# Patient Record
Sex: Female | Born: 1960 | Race: Black or African American | Hispanic: No | Smoking: Former smoker
Health system: Southern US, Community
[De-identification: ages and names within clinical notes are randomized; demographics above are authoritative.]

## PROBLEM LIST (undated history)

## (undated) DIAGNOSIS — K219 Gastro-esophageal reflux disease without esophagitis: Secondary | ICD-10-CM

## (undated) DIAGNOSIS — C50919 Malignant neoplasm of unspecified site of unspecified female breast: Secondary | ICD-10-CM

## (undated) DIAGNOSIS — B0089 Other herpesviral infection: Secondary | ICD-10-CM

## (undated) DIAGNOSIS — I890 Lymphedema, not elsewhere classified: Secondary | ICD-10-CM

## (undated) DIAGNOSIS — E7800 Pure hypercholesterolemia, unspecified: Secondary | ICD-10-CM

## (undated) DIAGNOSIS — I10 Essential (primary) hypertension: Secondary | ICD-10-CM

## (undated) DIAGNOSIS — M3313 Other dermatomyositis without myopathy: Secondary | ICD-10-CM

## (undated) DIAGNOSIS — M1990 Unspecified osteoarthritis, unspecified site: Secondary | ICD-10-CM

## (undated) DIAGNOSIS — M3390 Dermatopolymyositis, unspecified, organ involvement unspecified: Secondary | ICD-10-CM

## (undated) DIAGNOSIS — I2510 Atherosclerotic heart disease of native coronary artery without angina pectoris: Secondary | ICD-10-CM

## (undated) DIAGNOSIS — E042 Nontoxic multinodular goiter: Secondary | ICD-10-CM

## (undated) HISTORY — PX: ABDOMINAL HYSTERECTOMY: SHX81

## (undated) HISTORY — DX: Lymphedema, not elsewhere classified: I89.0

## (undated) HISTORY — DX: Nontoxic multinodular goiter: E04.2

## (undated) HISTORY — PX: BREAST RECONSTRUCTION: SHX9

## (undated) HISTORY — DX: Essential (primary) hypertension: I10

## (undated) HISTORY — DX: Other herpesviral infection: B00.89

## (undated) HISTORY — DX: Dermatopolymyositis, unspecified, organ involvement unspecified: M33.90

## (undated) HISTORY — DX: Unspecified osteoarthritis, unspecified site: M19.90

## (undated) HISTORY — DX: Gastro-esophageal reflux disease without esophagitis: K21.9

## (undated) HISTORY — DX: Other dermatomyositis without myopathy: M33.13

## (undated) HISTORY — DX: Malignant neoplasm of unspecified site of unspecified female breast: C50.919

## (undated) HISTORY — DX: Pure hypercholesterolemia, unspecified: E78.00

---

## 1983-07-28 HISTORY — PX: TUBAL LIGATION: SHX77

## 2005-05-22 ENCOUNTER — Ambulatory Visit (HOSPITAL_COMMUNITY): Admission: RE | Admit: 2005-05-22 | Discharge: 2005-05-22 | Payer: Self-pay

## 2006-07-27 DIAGNOSIS — B0089 Other herpesviral infection: Secondary | ICD-10-CM

## 2006-07-27 DIAGNOSIS — C50919 Malignant neoplasm of unspecified site of unspecified female breast: Secondary | ICD-10-CM

## 2006-07-27 DIAGNOSIS — K208 Other esophagitis without bleeding: Secondary | ICD-10-CM

## 2006-07-27 HISTORY — PX: MASTECTOMY, PARTIAL: SHX709

## 2006-07-27 HISTORY — DX: Malignant neoplasm of unspecified site of unspecified female breast: C50.919

## 2006-07-27 HISTORY — DX: Other herpesviral infection: B00.89

## 2006-07-27 HISTORY — DX: Other esophagitis without bleeding: K20.80

## 2006-12-21 ENCOUNTER — Ambulatory Visit (HOSPITAL_COMMUNITY): Admission: RE | Admit: 2006-12-21 | Discharge: 2006-12-21 | Payer: Self-pay | Admitting: Obstetrics & Gynecology

## 2007-01-12 ENCOUNTER — Encounter (INDEPENDENT_AMBULATORY_CARE_PROVIDER_SITE_OTHER): Payer: Self-pay | Admitting: Diagnostic Radiology

## 2007-01-12 ENCOUNTER — Ambulatory Visit (HOSPITAL_COMMUNITY): Admission: RE | Admit: 2007-01-12 | Discharge: 2007-01-12 | Payer: Self-pay | Admitting: Obstetrics & Gynecology

## 2007-01-17 ENCOUNTER — Ambulatory Visit (HOSPITAL_COMMUNITY): Admission: RE | Admit: 2007-01-17 | Discharge: 2007-01-17 | Payer: Self-pay | Admitting: Obstetrics & Gynecology

## 2007-01-19 ENCOUNTER — Ambulatory Visit (HOSPITAL_COMMUNITY): Admission: RE | Admit: 2007-01-19 | Discharge: 2007-01-19 | Payer: Self-pay | Admitting: Obstetrics & Gynecology

## 2007-01-19 ENCOUNTER — Encounter (INDEPENDENT_AMBULATORY_CARE_PROVIDER_SITE_OTHER): Payer: Self-pay | Admitting: Diagnostic Radiology

## 2007-01-26 ENCOUNTER — Ambulatory Visit (HOSPITAL_COMMUNITY): Payer: Self-pay | Admitting: Oncology

## 2007-01-26 ENCOUNTER — Encounter (HOSPITAL_COMMUNITY): Admission: RE | Admit: 2007-01-26 | Discharge: 2007-02-25 | Payer: Self-pay | Admitting: Oncology

## 2007-01-31 ENCOUNTER — Ambulatory Visit (HOSPITAL_COMMUNITY): Admission: RE | Admit: 2007-01-31 | Discharge: 2007-01-31 | Payer: Self-pay | Admitting: Oncology

## 2007-02-02 ENCOUNTER — Encounter (INDEPENDENT_AMBULATORY_CARE_PROVIDER_SITE_OTHER): Payer: Self-pay | Admitting: Diagnostic Radiology

## 2007-02-02 ENCOUNTER — Ambulatory Visit (HOSPITAL_COMMUNITY): Admission: RE | Admit: 2007-02-02 | Discharge: 2007-02-02 | Payer: Self-pay | Admitting: General Surgery

## 2007-02-07 ENCOUNTER — Ambulatory Visit (HOSPITAL_COMMUNITY): Admission: RE | Admit: 2007-02-07 | Discharge: 2007-02-07 | Payer: Self-pay | Admitting: General Surgery

## 2007-02-21 ENCOUNTER — Ambulatory Visit (HOSPITAL_COMMUNITY): Admission: RE | Admit: 2007-02-21 | Discharge: 2007-02-21 | Payer: Self-pay | Admitting: General Surgery

## 2007-02-21 ENCOUNTER — Encounter (INDEPENDENT_AMBULATORY_CARE_PROVIDER_SITE_OTHER): Payer: Self-pay | Admitting: General Surgery

## 2007-02-22 ENCOUNTER — Ambulatory Visit: Payer: Self-pay | Admitting: Family Medicine

## 2007-02-22 DIAGNOSIS — I10 Essential (primary) hypertension: Secondary | ICD-10-CM | POA: Insufficient documentation

## 2007-02-22 DIAGNOSIS — E785 Hyperlipidemia, unspecified: Secondary | ICD-10-CM | POA: Insufficient documentation

## 2007-02-22 DIAGNOSIS — K219 Gastro-esophageal reflux disease without esophagitis: Secondary | ICD-10-CM | POA: Insufficient documentation

## 2007-02-22 DIAGNOSIS — Z853 Personal history of malignant neoplasm of breast: Secondary | ICD-10-CM | POA: Insufficient documentation

## 2007-02-24 ENCOUNTER — Encounter (INDEPENDENT_AMBULATORY_CARE_PROVIDER_SITE_OTHER): Payer: Self-pay | Admitting: Family Medicine

## 2007-03-01 ENCOUNTER — Ambulatory Visit: Payer: Self-pay | Admitting: Family Medicine

## 2007-03-04 ENCOUNTER — Encounter (INDEPENDENT_AMBULATORY_CARE_PROVIDER_SITE_OTHER): Payer: Self-pay | Admitting: Family Medicine

## 2007-03-07 ENCOUNTER — Telehealth (INDEPENDENT_AMBULATORY_CARE_PROVIDER_SITE_OTHER): Payer: Self-pay | Admitting: Family Medicine

## 2007-03-08 ENCOUNTER — Ambulatory Visit: Payer: Self-pay | Admitting: Family Medicine

## 2007-03-08 ENCOUNTER — Inpatient Hospital Stay (HOSPITAL_COMMUNITY): Admission: AD | Admit: 2007-03-08 | Discharge: 2007-03-15 | Payer: Self-pay

## 2007-03-08 ENCOUNTER — Ambulatory Visit: Payer: Self-pay | Admitting: Cardiology

## 2007-03-08 LAB — CONVERTED CEMR LAB
Albumin: 2.7 g/dL — ABNORMAL LOW (ref 3.5–5.2)
Alkaline Phosphatase: 80 units/L (ref 39–117)
BUN: 12 mg/dL (ref 6–23)
CO2: 26 meq/L (ref 19–32)
Calcium: 8.3 mg/dL — ABNORMAL LOW (ref 8.4–10.5)
Chloride: 97 meq/L (ref 96–112)
Glucose, Bld: 139 mg/dL — ABNORMAL HIGH (ref 70–99)
Glucose, Urine, Semiquant: NEGATIVE
Lymphocytes Relative: 5 % — ABNORMAL LOW (ref 12–46)
Lymphs Abs: 0.7 10*3/uL (ref 0.7–3.3)
MCV: 86.5 fL (ref 78.0–100.0)
Monocytes Relative: 1 % — ABNORMAL LOW (ref 3–11)
Neutro Abs: 13.4 10*3/uL — ABNORMAL HIGH (ref 1.7–7.7)
Neutrophils Relative %: 94 % — ABNORMAL HIGH (ref 43–77)
Platelets: 261 10*3/uL (ref 150–400)
Potassium: 3.6 meq/L (ref 3.5–5.3)
Protein, U semiquant: 100
RBC: 4.17 M/uL (ref 3.87–5.11)
Rapid Strep: NEGATIVE
Sodium: 131 meq/L — ABNORMAL LOW (ref 135–145)
Specific Gravity, Urine: 1.025
Total Protein: 6.3 g/dL (ref 6.0–8.3)
WBC Urine, dipstick: NEGATIVE
WBC: 14.3 10*3/uL — ABNORMAL HIGH (ref 4.0–10.5)

## 2007-03-09 ENCOUNTER — Encounter (INDEPENDENT_AMBULATORY_CARE_PROVIDER_SITE_OTHER): Payer: Self-pay | Admitting: Family Medicine

## 2007-03-10 ENCOUNTER — Encounter (INDEPENDENT_AMBULATORY_CARE_PROVIDER_SITE_OTHER): Payer: Self-pay | Admitting: Family Medicine

## 2007-03-10 ENCOUNTER — Ambulatory Visit: Payer: Self-pay | Admitting: Oncology

## 2007-03-15 ENCOUNTER — Encounter (INDEPENDENT_AMBULATORY_CARE_PROVIDER_SITE_OTHER): Payer: Self-pay | Admitting: Family Medicine

## 2007-03-16 ENCOUNTER — Ambulatory Visit: Payer: Self-pay | Admitting: Cardiology

## 2007-03-16 ENCOUNTER — Encounter (INDEPENDENT_AMBULATORY_CARE_PROVIDER_SITE_OTHER): Payer: Self-pay | Admitting: Family Medicine

## 2007-03-17 ENCOUNTER — Ambulatory Visit (HOSPITAL_COMMUNITY): Admission: RE | Admit: 2007-03-17 | Discharge: 2007-03-17 | Payer: Self-pay | Admitting: Cardiology

## 2007-03-17 ENCOUNTER — Ambulatory Visit: Payer: Self-pay | Admitting: Cardiology

## 2007-03-22 ENCOUNTER — Ambulatory Visit (HOSPITAL_COMMUNITY): Payer: Self-pay | Admitting: Oncology

## 2007-03-22 ENCOUNTER — Encounter (HOSPITAL_COMMUNITY): Admission: RE | Admit: 2007-03-22 | Discharge: 2007-04-21 | Payer: Self-pay | Admitting: Oncology

## 2007-03-23 ENCOUNTER — Encounter (INDEPENDENT_AMBULATORY_CARE_PROVIDER_SITE_OTHER): Payer: Self-pay | Admitting: Family Medicine

## 2007-03-29 ENCOUNTER — Encounter (INDEPENDENT_AMBULATORY_CARE_PROVIDER_SITE_OTHER): Payer: Self-pay | Admitting: Family Medicine

## 2007-03-29 ENCOUNTER — Telehealth (INDEPENDENT_AMBULATORY_CARE_PROVIDER_SITE_OTHER): Payer: Self-pay | Admitting: Family Medicine

## 2007-03-30 ENCOUNTER — Ambulatory Visit (HOSPITAL_COMMUNITY): Admission: RE | Admit: 2007-03-30 | Discharge: 2007-03-30 | Payer: Self-pay | Admitting: Family Medicine

## 2007-04-05 ENCOUNTER — Telehealth (INDEPENDENT_AMBULATORY_CARE_PROVIDER_SITE_OTHER): Payer: Self-pay | Admitting: Family Medicine

## 2007-04-05 ENCOUNTER — Ambulatory Visit: Payer: Self-pay | Admitting: Family Medicine

## 2007-04-05 DIAGNOSIS — E041 Nontoxic single thyroid nodule: Secondary | ICD-10-CM

## 2007-04-06 ENCOUNTER — Encounter (INDEPENDENT_AMBULATORY_CARE_PROVIDER_SITE_OTHER): Payer: Self-pay | Admitting: Family Medicine

## 2007-04-07 ENCOUNTER — Telehealth (INDEPENDENT_AMBULATORY_CARE_PROVIDER_SITE_OTHER): Payer: Self-pay | Admitting: *Deleted

## 2007-04-08 ENCOUNTER — Ambulatory Visit (HOSPITAL_COMMUNITY): Admission: RE | Admit: 2007-04-08 | Discharge: 2007-04-08 | Payer: Self-pay | Admitting: Cardiovascular Disease

## 2007-04-11 ENCOUNTER — Telehealth (INDEPENDENT_AMBULATORY_CARE_PROVIDER_SITE_OTHER): Payer: Self-pay | Admitting: *Deleted

## 2007-04-12 ENCOUNTER — Encounter (INDEPENDENT_AMBULATORY_CARE_PROVIDER_SITE_OTHER): Payer: Self-pay | Admitting: Family Medicine

## 2007-04-18 ENCOUNTER — Ambulatory Visit: Payer: Self-pay | Admitting: Family Medicine

## 2007-04-18 LAB — CONVERTED CEMR LAB
Cholesterol, target level: 200 mg/dL
LDL Goal: 130 mg/dL

## 2007-04-19 ENCOUNTER — Encounter (INDEPENDENT_AMBULATORY_CARE_PROVIDER_SITE_OTHER): Payer: Self-pay | Admitting: Family Medicine

## 2007-04-19 ENCOUNTER — Telehealth (INDEPENDENT_AMBULATORY_CARE_PROVIDER_SITE_OTHER): Payer: Self-pay | Admitting: *Deleted

## 2007-04-21 ENCOUNTER — Telehealth (INDEPENDENT_AMBULATORY_CARE_PROVIDER_SITE_OTHER): Payer: Self-pay | Admitting: *Deleted

## 2007-04-22 ENCOUNTER — Encounter (INDEPENDENT_AMBULATORY_CARE_PROVIDER_SITE_OTHER): Payer: Self-pay | Admitting: Family Medicine

## 2007-04-25 ENCOUNTER — Telehealth (INDEPENDENT_AMBULATORY_CARE_PROVIDER_SITE_OTHER): Payer: Self-pay | Admitting: *Deleted

## 2007-04-25 ENCOUNTER — Ambulatory Visit (HOSPITAL_COMMUNITY): Admission: RE | Admit: 2007-04-25 | Discharge: 2007-04-25 | Payer: Self-pay | Admitting: Family Medicine

## 2007-04-25 ENCOUNTER — Encounter (INDEPENDENT_AMBULATORY_CARE_PROVIDER_SITE_OTHER): Payer: Self-pay | Admitting: Family Medicine

## 2007-04-27 ENCOUNTER — Ambulatory Visit: Payer: Self-pay | Admitting: Gastroenterology

## 2007-04-29 ENCOUNTER — Encounter (HOSPITAL_COMMUNITY): Admission: RE | Admit: 2007-04-29 | Discharge: 2007-05-29 | Payer: Self-pay | Admitting: Oncology

## 2007-05-02 ENCOUNTER — Ambulatory Visit: Payer: Self-pay | Admitting: Gastroenterology

## 2007-05-02 ENCOUNTER — Encounter (INDEPENDENT_AMBULATORY_CARE_PROVIDER_SITE_OTHER): Payer: Self-pay | Admitting: Family Medicine

## 2007-05-02 ENCOUNTER — Encounter: Payer: Self-pay | Admitting: Gastroenterology

## 2007-05-02 ENCOUNTER — Ambulatory Visit (HOSPITAL_COMMUNITY): Admission: RE | Admit: 2007-05-02 | Discharge: 2007-05-02 | Payer: Self-pay | Admitting: Gastroenterology

## 2007-05-02 HISTORY — PX: ESOPHAGOGASTRODUODENOSCOPY: SHX1529

## 2007-05-05 ENCOUNTER — Ambulatory Visit: Payer: Self-pay | Admitting: Family Medicine

## 2007-05-11 ENCOUNTER — Encounter (INDEPENDENT_AMBULATORY_CARE_PROVIDER_SITE_OTHER): Payer: Self-pay | Admitting: Family Medicine

## 2007-05-11 ENCOUNTER — Ambulatory Visit (HOSPITAL_COMMUNITY): Payer: Self-pay | Admitting: Oncology

## 2007-05-16 ENCOUNTER — Encounter (INDEPENDENT_AMBULATORY_CARE_PROVIDER_SITE_OTHER): Payer: Self-pay | Admitting: Family Medicine

## 2007-05-19 ENCOUNTER — Encounter (INDEPENDENT_AMBULATORY_CARE_PROVIDER_SITE_OTHER): Payer: Self-pay | Admitting: Family Medicine

## 2007-06-08 ENCOUNTER — Encounter (HOSPITAL_COMMUNITY): Admission: RE | Admit: 2007-06-08 | Discharge: 2007-07-08 | Payer: Self-pay | Admitting: Oncology

## 2007-06-16 ENCOUNTER — Encounter (INDEPENDENT_AMBULATORY_CARE_PROVIDER_SITE_OTHER): Payer: Self-pay | Admitting: Family Medicine

## 2007-06-20 ENCOUNTER — Ambulatory Visit: Payer: Self-pay | Admitting: Family Medicine

## 2007-06-29 ENCOUNTER — Encounter (INDEPENDENT_AMBULATORY_CARE_PROVIDER_SITE_OTHER): Payer: Self-pay | Admitting: Family Medicine

## 2007-06-30 ENCOUNTER — Encounter (INDEPENDENT_AMBULATORY_CARE_PROVIDER_SITE_OTHER): Payer: Self-pay | Admitting: Family Medicine

## 2007-06-30 ENCOUNTER — Telehealth (INDEPENDENT_AMBULATORY_CARE_PROVIDER_SITE_OTHER): Payer: Self-pay | Admitting: *Deleted

## 2007-06-30 LAB — CONVERTED CEMR LAB
AST: 18 units/L (ref 0–37)
BUN: 8 mg/dL (ref 6–23)
Calcium: 9.4 mg/dL (ref 8.4–10.5)
Chloride: 104 meq/L (ref 96–112)
Creatinine, Ser: 0.95 mg/dL (ref 0.40–1.20)
HDL: 43 mg/dL (ref >39)
Total Bilirubin: 0.4 mg/dL (ref 0.3–1.2)
Total CHOL/HDL Ratio: 4.8
VLDL: 32 mg/dL (ref 0–40)

## 2007-07-04 ENCOUNTER — Encounter (INDEPENDENT_AMBULATORY_CARE_PROVIDER_SITE_OTHER): Payer: Self-pay | Admitting: Family Medicine

## 2007-07-04 ENCOUNTER — Ambulatory Visit (HOSPITAL_COMMUNITY): Payer: Self-pay | Admitting: Oncology

## 2007-07-11 ENCOUNTER — Ambulatory Visit (HOSPITAL_COMMUNITY): Admission: RE | Admit: 2007-07-11 | Discharge: 2007-07-12 | Payer: Self-pay | Admitting: General Surgery

## 2007-07-11 ENCOUNTER — Encounter: Admission: RE | Admit: 2007-07-11 | Discharge: 2007-07-11 | Payer: Self-pay | Admitting: General Surgery

## 2007-07-11 ENCOUNTER — Encounter (INDEPENDENT_AMBULATORY_CARE_PROVIDER_SITE_OTHER): Payer: Self-pay | Admitting: General Surgery

## 2007-07-25 ENCOUNTER — Telehealth (INDEPENDENT_AMBULATORY_CARE_PROVIDER_SITE_OTHER): Payer: Self-pay | Admitting: Family Medicine

## 2007-07-26 ENCOUNTER — Ambulatory Visit: Payer: Self-pay | Admitting: Family Medicine

## 2007-07-26 DIAGNOSIS — N951 Menopausal and female climacteric states: Secondary | ICD-10-CM | POA: Insufficient documentation

## 2007-07-27 ENCOUNTER — Encounter (INDEPENDENT_AMBULATORY_CARE_PROVIDER_SITE_OTHER): Payer: Self-pay | Admitting: Family Medicine

## 2007-07-27 ENCOUNTER — Telehealth (INDEPENDENT_AMBULATORY_CARE_PROVIDER_SITE_OTHER): Payer: Self-pay | Admitting: *Deleted

## 2007-07-27 LAB — CONVERTED CEMR LAB
Basophils Absolute: 0 10*3/uL (ref 0.0–0.1)
Basophils Relative: 1 % (ref 0–1)
Eosinophils Absolute: 0.3 10*3/uL (ref 0.0–0.7)
Eosinophils Relative: 5 % (ref 0–5)
HCT: 36.5 % (ref 36.0–46.0)
Hemoglobin: 11.4 g/dL — ABNORMAL LOW (ref 12.0–15.0)
Lymphocytes Relative: 21 % (ref 12–46)
Lymphs Abs: 1.4 10*3/uL (ref 0.7–4.0)
MCHC: 31.2 g/dL (ref 30.0–36.0)
MCV: 88.8 fL (ref 78.0–100.0)
Monocytes Absolute: 0.4 10*3/uL (ref 0.1–1.0)
Monocytes Relative: 6 % (ref 3–12)
Neutro Abs: 4.3 10*3/uL (ref 1.7–7.7)
Neutrophils Relative %: 68 % (ref 43–77)
Platelets: 280 10*3/uL (ref 150–400)
RBC: 4.11 M/uL (ref 3.87–5.11)
RDW: 15.1 % (ref 11.5–15.5)
TSH: 0.807 microintl units/mL (ref 0.350–5.50)
WBC: 6.4 10*3/uL (ref 4.0–10.5)

## 2007-08-01 ENCOUNTER — Encounter (INDEPENDENT_AMBULATORY_CARE_PROVIDER_SITE_OTHER): Payer: Self-pay | Admitting: Family Medicine

## 2007-08-02 ENCOUNTER — Ambulatory Visit: Admission: RE | Admit: 2007-08-02 | Discharge: 2007-10-31 | Payer: Self-pay | Admitting: Radiation Oncology

## 2007-08-22 ENCOUNTER — Ambulatory Visit (HOSPITAL_COMMUNITY): Payer: Self-pay | Admitting: Oncology

## 2007-08-22 ENCOUNTER — Encounter (INDEPENDENT_AMBULATORY_CARE_PROVIDER_SITE_OTHER): Payer: Self-pay | Admitting: Family Medicine

## 2007-08-24 ENCOUNTER — Ambulatory Visit: Payer: Self-pay | Admitting: Family Medicine

## 2007-10-21 ENCOUNTER — Ambulatory Visit: Payer: Self-pay | Admitting: Family Medicine

## 2007-11-25 ENCOUNTER — Encounter (INDEPENDENT_AMBULATORY_CARE_PROVIDER_SITE_OTHER): Payer: Self-pay | Admitting: Family Medicine

## 2007-11-26 ENCOUNTER — Emergency Department (HOSPITAL_COMMUNITY): Admission: EM | Admit: 2007-11-26 | Discharge: 2007-11-26 | Payer: Self-pay | Admitting: Emergency Medicine

## 2007-11-28 ENCOUNTER — Encounter (INDEPENDENT_AMBULATORY_CARE_PROVIDER_SITE_OTHER): Payer: Self-pay | Admitting: Family Medicine

## 2007-11-28 ENCOUNTER — Telehealth (INDEPENDENT_AMBULATORY_CARE_PROVIDER_SITE_OTHER): Payer: Self-pay | Admitting: *Deleted

## 2007-11-28 LAB — CONVERTED CEMR LAB
ALT: 11 units/L (ref 0–35)
AST: 12 units/L (ref 0–37)
Alkaline Phosphatase: 99 units/L (ref 39–117)
LDL Cholesterol: 109 mg/dL — ABNORMAL HIGH (ref 0–99)
Sodium: 143 meq/L (ref 135–145)
Total Bilirubin: 0.4 mg/dL (ref 0.3–1.2)
Total Protein: 7.6 g/dL (ref 6.0–8.3)
VLDL: 21 mg/dL (ref 0–40)

## 2007-12-09 ENCOUNTER — Telehealth (INDEPENDENT_AMBULATORY_CARE_PROVIDER_SITE_OTHER): Payer: Self-pay | Admitting: *Deleted

## 2007-12-09 ENCOUNTER — Ambulatory Visit: Payer: Self-pay | Admitting: Family Medicine

## 2007-12-09 LAB — CONVERTED CEMR LAB

## 2008-01-20 ENCOUNTER — Ambulatory Visit: Payer: Self-pay | Admitting: Family Medicine

## 2008-01-20 LAB — CONVERTED CEMR LAB
Bilirubin Urine: NEGATIVE
Blood in Urine, dipstick: NEGATIVE
Glucose, Urine, Semiquant: NEGATIVE
Ketones, urine, test strip: NEGATIVE
Nitrite: NEGATIVE
Specific Gravity, Urine: 1.02
Urobilinogen, UA: 0.2
pH: 6

## 2008-01-23 ENCOUNTER — Encounter (INDEPENDENT_AMBULATORY_CARE_PROVIDER_SITE_OTHER): Payer: Self-pay | Admitting: Family Medicine

## 2008-01-23 ENCOUNTER — Ambulatory Visit (HOSPITAL_COMMUNITY): Payer: Self-pay | Admitting: Oncology

## 2008-01-23 ENCOUNTER — Encounter (HOSPITAL_COMMUNITY): Admission: RE | Admit: 2008-01-23 | Discharge: 2008-02-22 | Payer: Self-pay | Admitting: Oncology

## 2008-02-28 ENCOUNTER — Encounter (INDEPENDENT_AMBULATORY_CARE_PROVIDER_SITE_OTHER): Payer: Self-pay | Admitting: Family Medicine

## 2008-03-05 ENCOUNTER — Encounter (HOSPITAL_COMMUNITY): Admission: RE | Admit: 2008-03-05 | Discharge: 2008-04-04 | Payer: Self-pay | Admitting: Oncology

## 2008-04-03 ENCOUNTER — Ambulatory Visit: Payer: Self-pay | Admitting: Family Medicine

## 2008-04-03 DIAGNOSIS — F341 Dysthymic disorder: Secondary | ICD-10-CM | POA: Insufficient documentation

## 2008-05-15 ENCOUNTER — Ambulatory Visit: Payer: Self-pay | Admitting: Family Medicine

## 2008-06-13 ENCOUNTER — Ambulatory Visit: Payer: Self-pay | Admitting: Family Medicine

## 2008-06-13 LAB — CONVERTED CEMR LAB

## 2008-06-29 ENCOUNTER — Encounter (INDEPENDENT_AMBULATORY_CARE_PROVIDER_SITE_OTHER): Payer: Self-pay | Admitting: Family Medicine

## 2008-07-02 LAB — CONVERTED CEMR LAB
ALT: 11 units/L (ref 0–35)
CO2: 25 meq/L (ref 19–32)
Creatinine, Ser: 1.13 mg/dL (ref 0.40–1.20)
Total Bilirubin: 0.4 mg/dL (ref 0.3–1.2)

## 2008-07-16 ENCOUNTER — Encounter (INDEPENDENT_AMBULATORY_CARE_PROVIDER_SITE_OTHER): Payer: Self-pay | Admitting: Family Medicine

## 2008-08-07 ENCOUNTER — Ambulatory Visit: Payer: Self-pay | Admitting: Family Medicine

## 2008-09-03 ENCOUNTER — Encounter (INDEPENDENT_AMBULATORY_CARE_PROVIDER_SITE_OTHER): Payer: Self-pay | Admitting: Family Medicine

## 2008-09-04 ENCOUNTER — Ambulatory Visit: Payer: Self-pay | Admitting: Family Medicine

## 2008-09-04 ENCOUNTER — Telehealth (INDEPENDENT_AMBULATORY_CARE_PROVIDER_SITE_OTHER): Payer: Self-pay | Admitting: *Deleted

## 2008-09-04 DIAGNOSIS — I890 Lymphedema, not elsewhere classified: Secondary | ICD-10-CM | POA: Insufficient documentation

## 2008-09-06 ENCOUNTER — Encounter (INDEPENDENT_AMBULATORY_CARE_PROVIDER_SITE_OTHER): Payer: Self-pay | Admitting: Family Medicine

## 2008-09-06 LAB — CONVERTED CEMR LAB
Alkaline Phosphatase: 97 units/L (ref 39–117)
BUN: 12 mg/dL (ref 6–23)
Creatinine, Ser: 0.87 mg/dL (ref 0.40–1.20)
Eosinophils Absolute: 0.2 10*3/uL (ref 0.0–0.7)
Eosinophils Relative: 3 % (ref 0–5)
Glucose, Bld: 76 mg/dL (ref 70–99)
HCT: 39.1 % (ref 36.0–46.0)
HDL: 55 mg/dL (ref >39)
LDL Cholesterol: 137 mg/dL — ABNORMAL HIGH (ref 0–99)
Lymphs Abs: 2.6 10*3/uL (ref 0.7–4.0)
MCV: 89.1 fL (ref 78.0–100.0)
Monocytes Absolute: 0.3 10*3/uL (ref 0.1–1.0)
Monocytes Relative: 5 % (ref 3–12)
Platelets: 248 10*3/uL (ref 150–400)
Sodium: 143 meq/L (ref 135–145)
Total Bilirubin: 0.3 mg/dL (ref 0.3–1.2)
Triglycerides: 158 mg/dL — ABNORMAL HIGH (ref <150)
VLDL: 32 mg/dL (ref 0–40)
WBC: 6 10*3/uL (ref 4.0–10.5)

## 2008-09-11 ENCOUNTER — Encounter (HOSPITAL_COMMUNITY): Admission: RE | Admit: 2008-09-11 | Discharge: 2008-10-11 | Payer: Self-pay | Admitting: Family Medicine

## 2008-09-11 ENCOUNTER — Encounter (INDEPENDENT_AMBULATORY_CARE_PROVIDER_SITE_OTHER): Payer: Self-pay | Admitting: Family Medicine

## 2008-09-12 ENCOUNTER — Encounter (INDEPENDENT_AMBULATORY_CARE_PROVIDER_SITE_OTHER): Payer: Self-pay | Admitting: Family Medicine

## 2008-09-14 LAB — CONVERTED CEMR LAB

## 2008-09-18 ENCOUNTER — Ambulatory Visit: Payer: Self-pay | Admitting: Family Medicine

## 2008-10-06 IMAGING — US US ABDOMEN COMPLETE
1 series · 14 of 25 positions shown · non-contrast
Comparison: None.

CLINICAL DATA: 45 year-old with sepsis.  History of left breast cancer.  Elevated liver function study.
ABDOMEN ULTRASOUND:
TECHNIQUE: Complete abdominal ultrasound examination was performed including evaluation of the liver, gallbladder, bile ducts, pancreas, kidneys, spleen, IVC, and abdominal aorta.

[Series 1: unknown · 0.35mm/px · 14 of 71 slices shown]
[im 1/71]
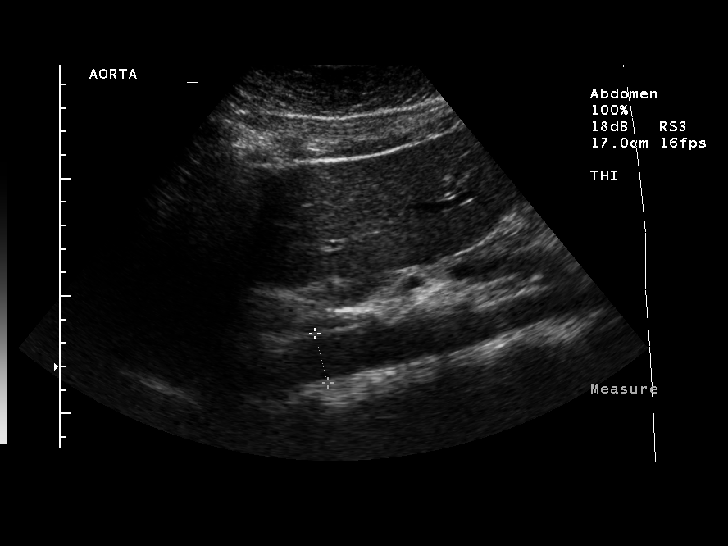
[im 6/71]
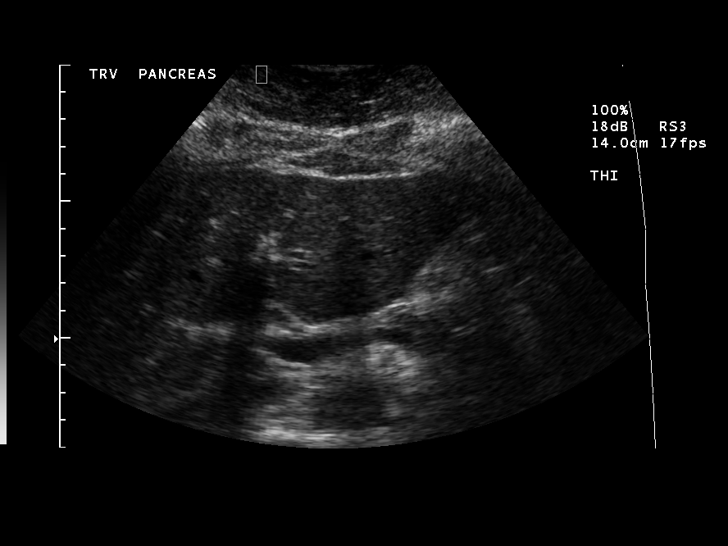
[im 12/71]
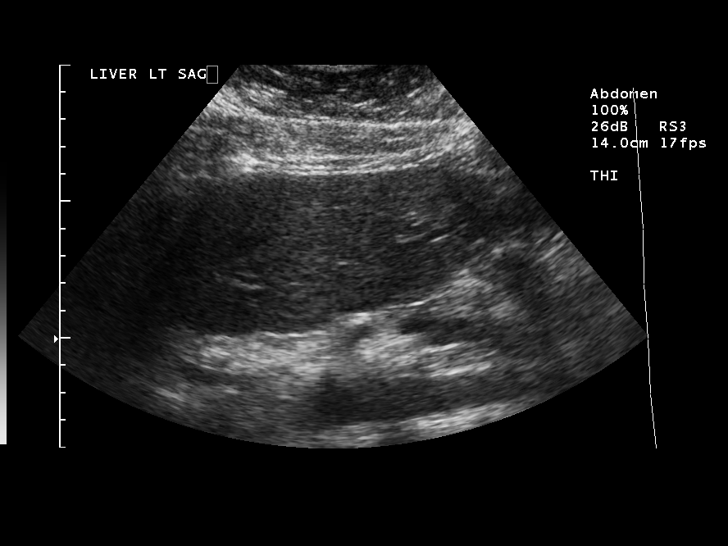
[im 18/71]
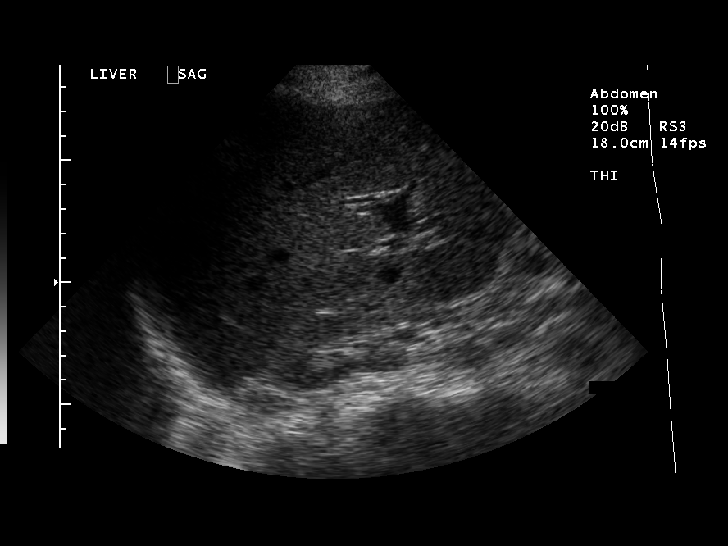
[im 24/71]
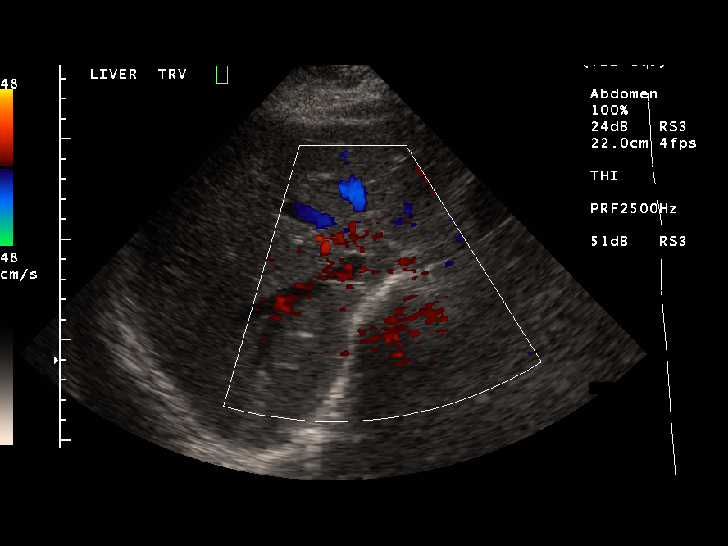
[im 27/71]
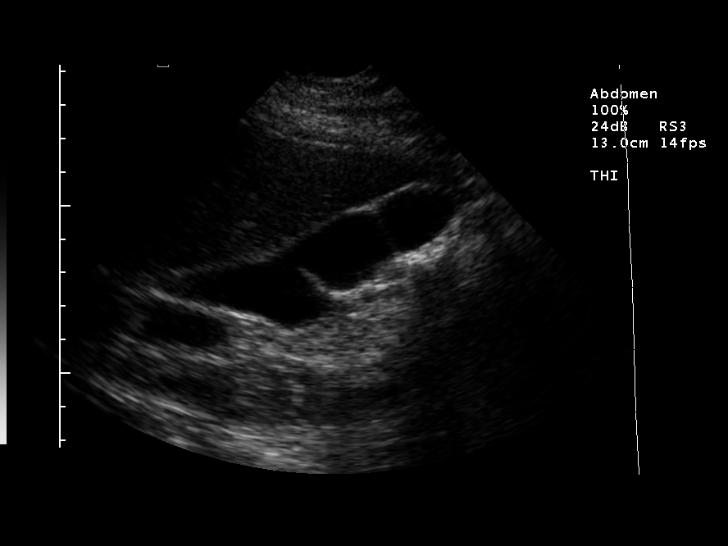
[im 33/71]
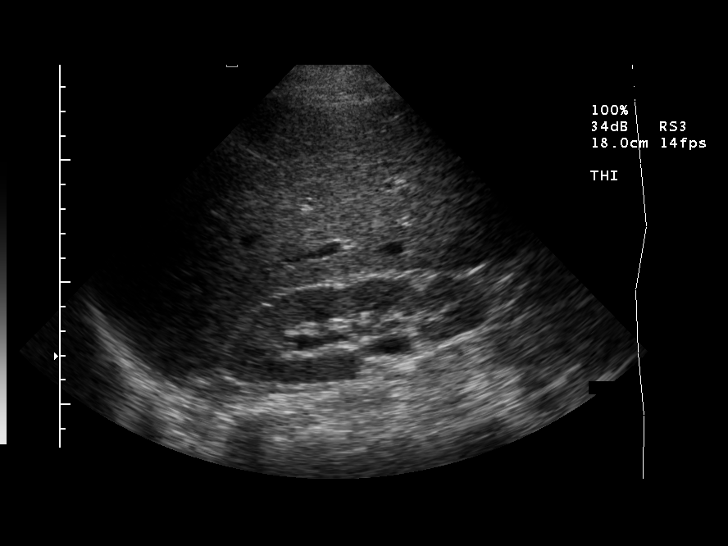
[im 38/71]
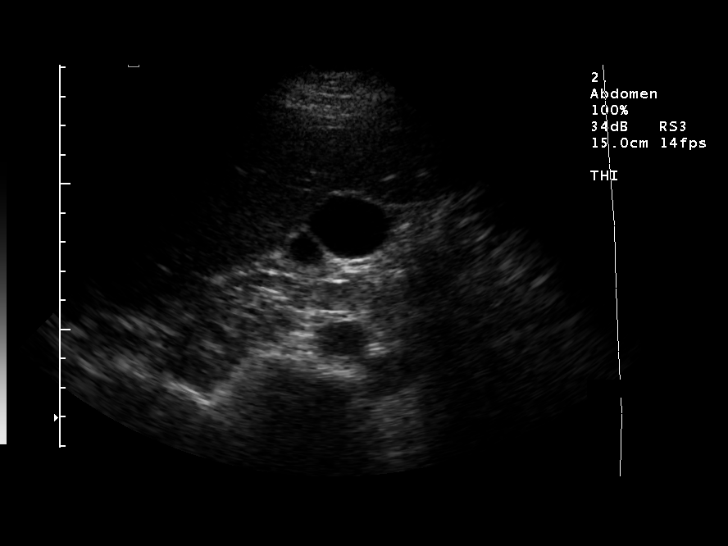
[im 44/71]
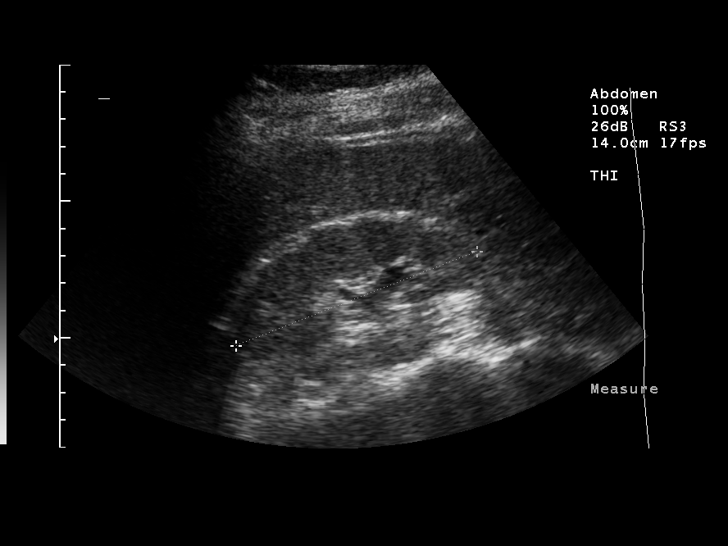
[im 47/71]
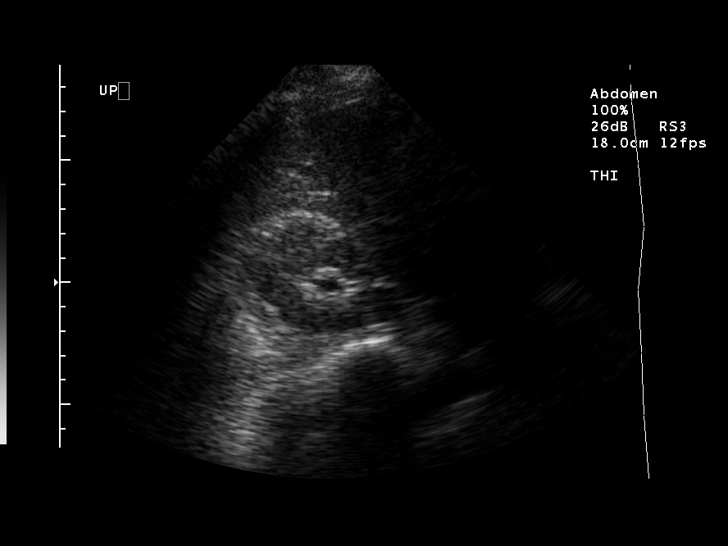
[im 53/71]
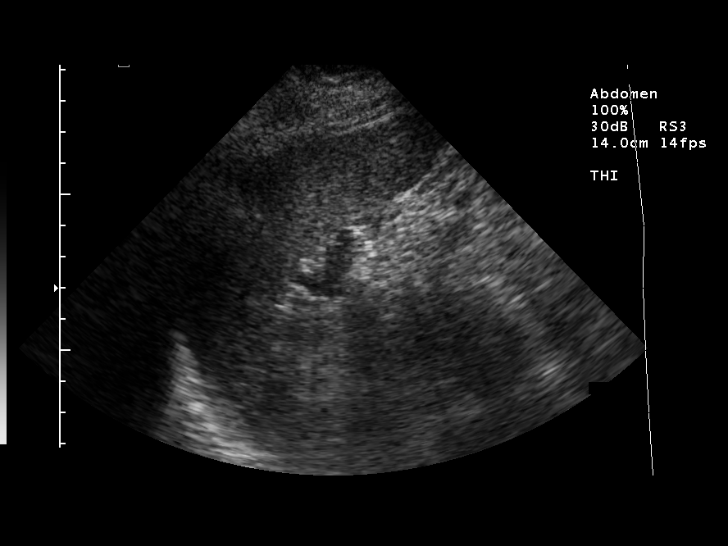
[im 59/71]
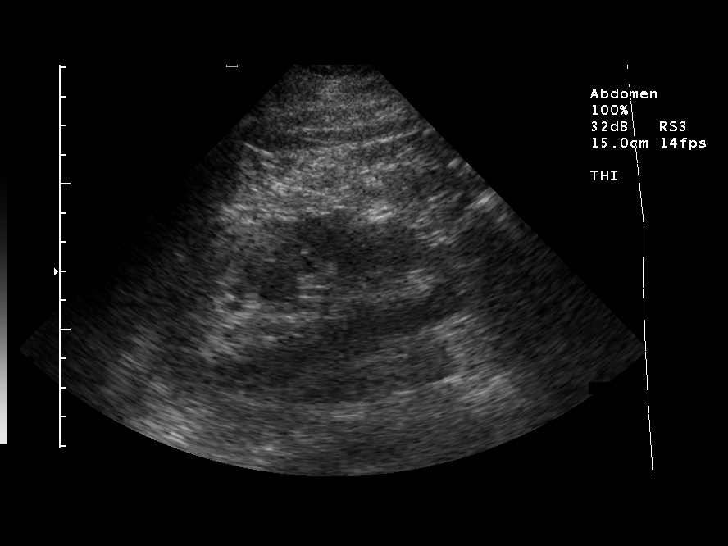
[im 65/71]
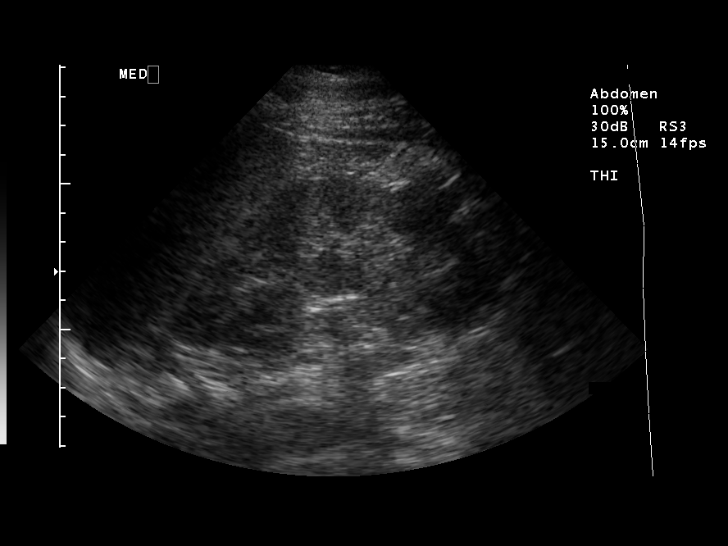
[im 71/71]
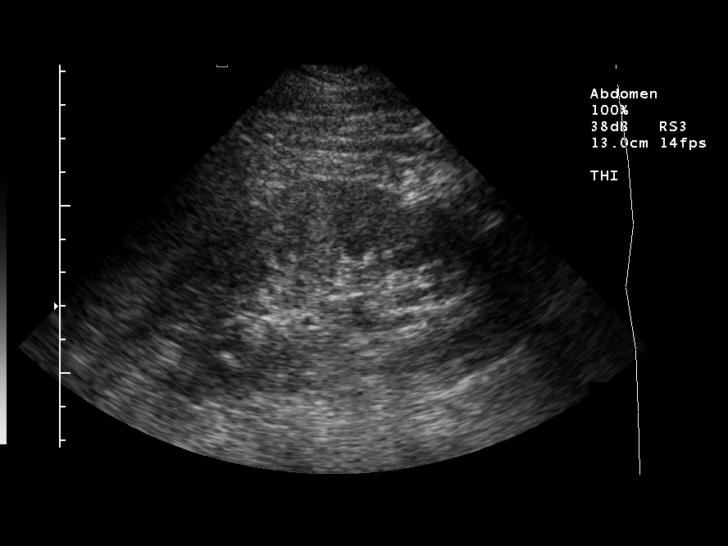

[14 of 25 positions shown; findings below may reference images not displayed]

FINDINGS: the liver demonstrates heterogeneous echogenicity, which may be due to fatty infiltration.  I do not see any definite focal hepatic lesions or intrahepatic ductal dilatation.  The common bile duct is normal in caliber measuring 5mm.  Multiple fullness in the gallbladder but no gallstones, wall thickening, or pericholecystic fluid.  The IVC and aorta are normal in caliber.
Pancreas is not well visualized.  Spleen is upper limits of normal in size.  Normal echogenicity.
The right kidney measures 7.6cm.  The left kidney measures 7.6cm.  Both kidneys demonstrate normal echogenicity without focal lesions or hydronephrosis.
IMPRESSION: 1. Heterogeneous echogenicity of the liver but no focal hepatic lesions or intrahepatic ductal dilatation.
2. Normal common bile duct.
3. Normal gallbladder.
4. Limited visualization of the pancreas.

## 2008-10-08 IMAGING — CR DG HIP COMPLETE 2+V*R*
3 series · 3 of 3 positions shown · non-contrast
Comparison: None.

RIGHT HIP - 2  VIEW AND PELVIS - 1 VIEW:

CLINICAL DATA: Right hip pain without injury.

[view not recorded (1 of 3)]
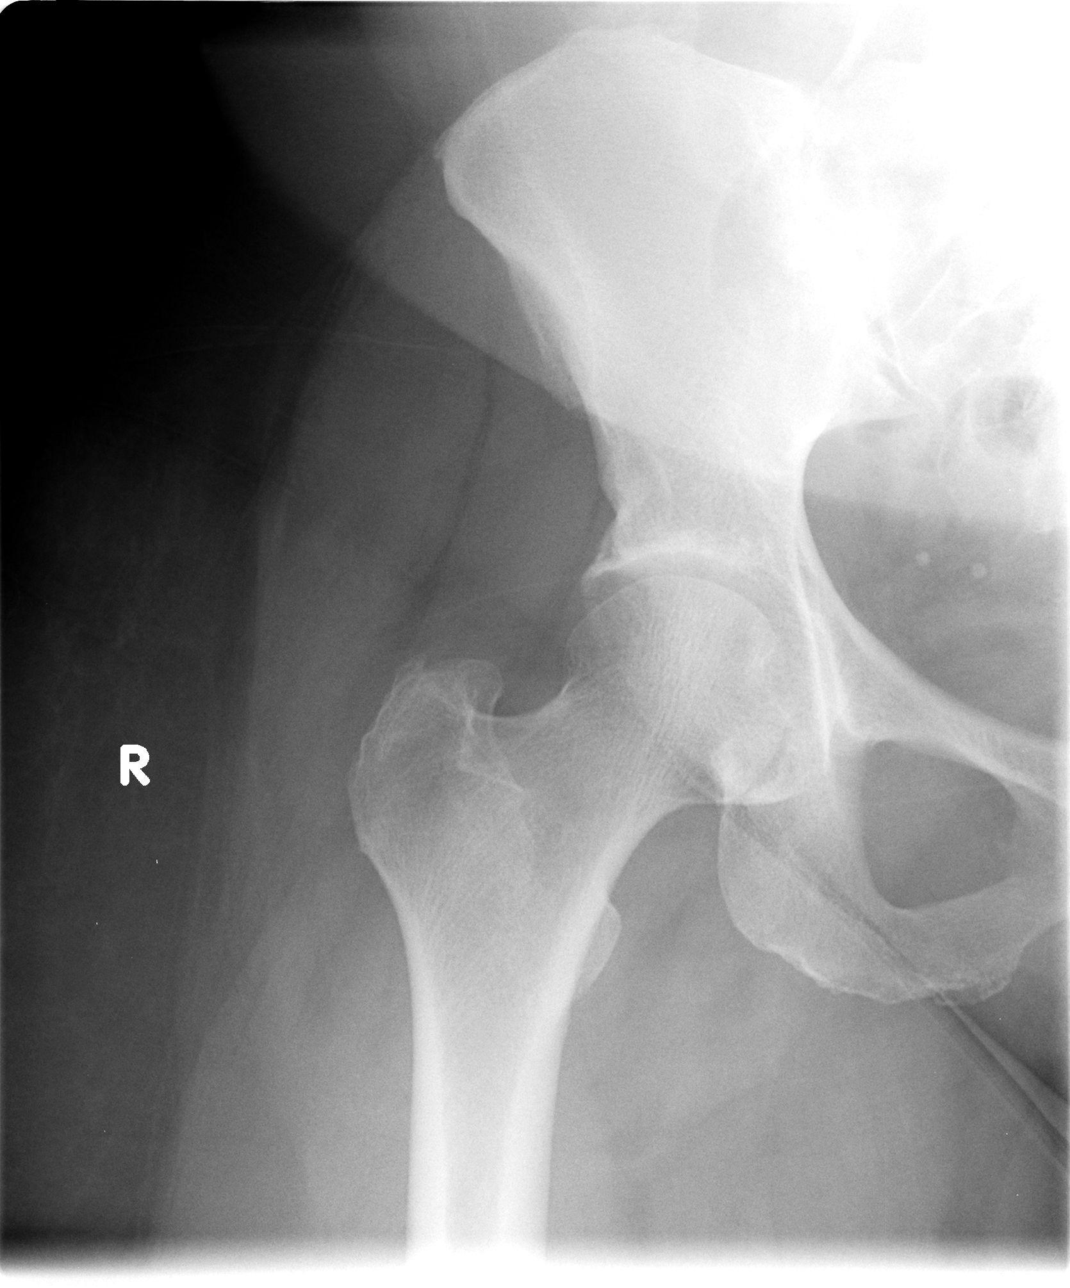

[view not recorded (2 of 3)]
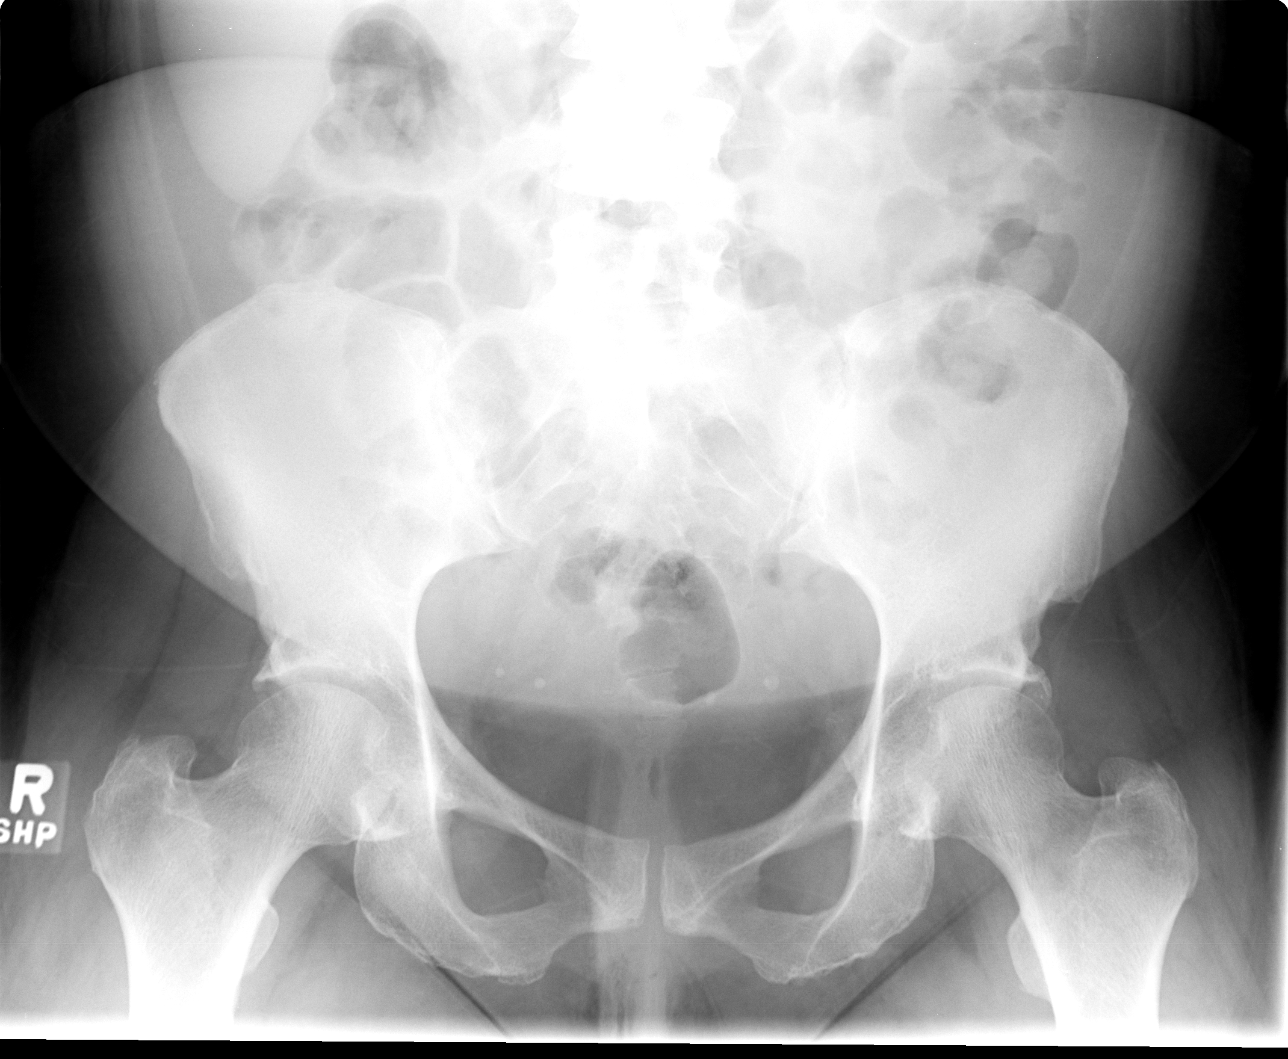

[view not recorded (3 of 3)]
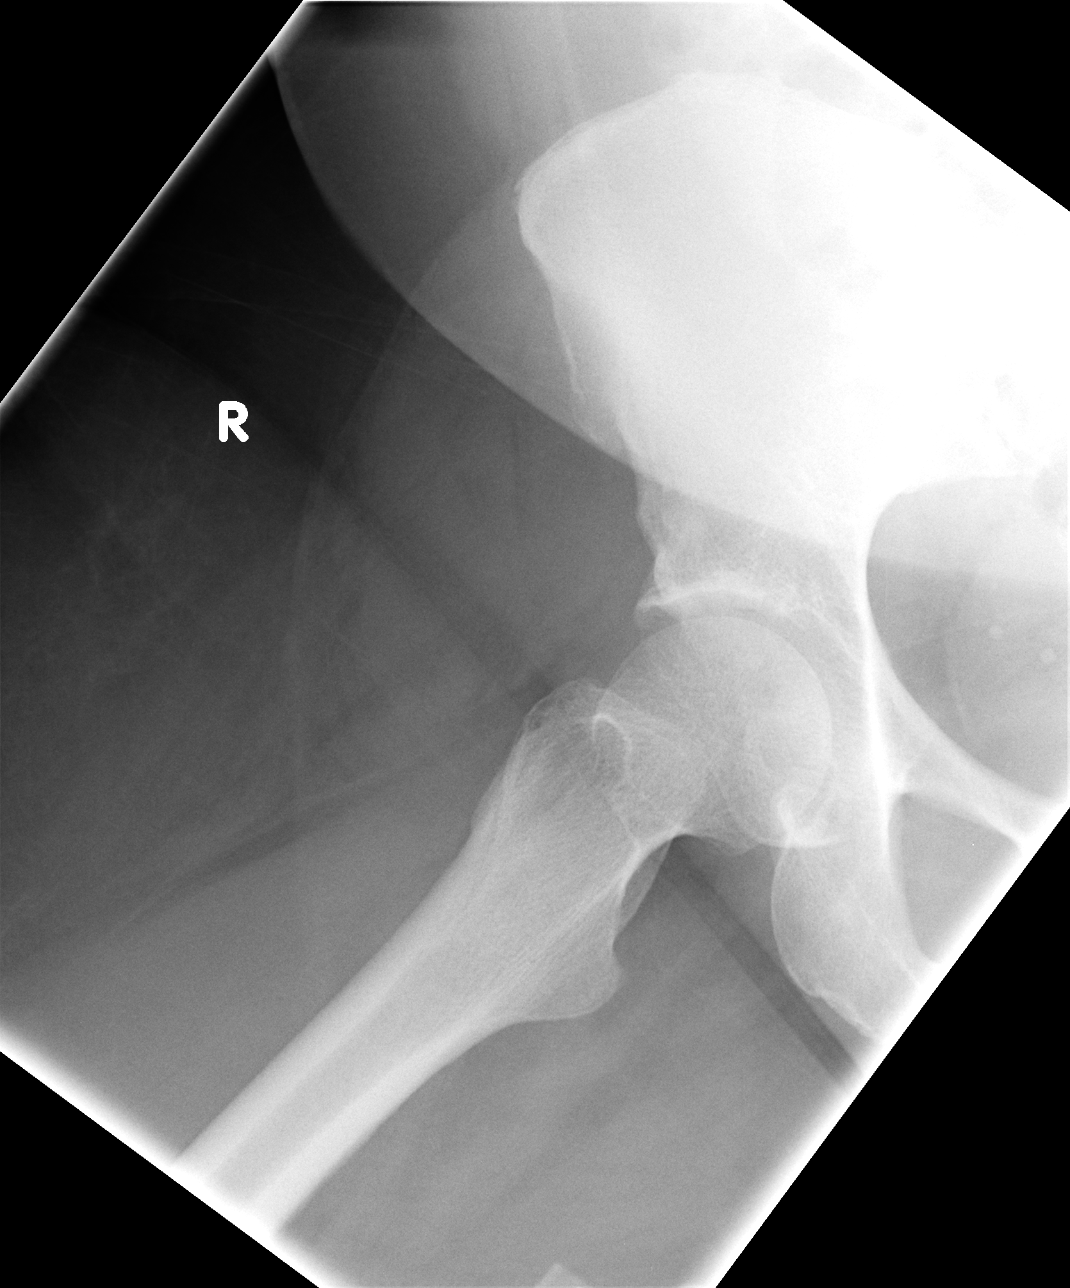

[3 of 3 positions shown; findings below may reference images not displayed]

FINDINGS: Frontal pelvis shows no acute fracture . SI  joints are unremarkable.
Arcuate lines of the sacrum are preserved. Joint space in the hips is relatively
well-preserved and symmetric. There is mild hypertrophic spurring in the
acetabuli bilaterally. Pubic rami are unremarkable.

AP and frogleg lateral views of the right hip show no evidence for femoral neck
fracture.
IMPRESSION: No acute bony abnormality. No radiographic evidence to explain this patient's
history of hip pain.

## 2008-10-16 ENCOUNTER — Encounter (INDEPENDENT_AMBULATORY_CARE_PROVIDER_SITE_OTHER): Payer: Self-pay | Admitting: Family Medicine

## 2008-10-31 ENCOUNTER — Encounter (INDEPENDENT_AMBULATORY_CARE_PROVIDER_SITE_OTHER): Payer: Self-pay | Admitting: Family Medicine

## 2008-11-08 ENCOUNTER — Encounter (INDEPENDENT_AMBULATORY_CARE_PROVIDER_SITE_OTHER): Payer: Self-pay | Admitting: Family Medicine

## 2008-11-09 ENCOUNTER — Ambulatory Visit: Payer: Self-pay | Admitting: Family Medicine

## 2008-11-09 DIAGNOSIS — M3390 Dermatopolymyositis, unspecified, organ involvement unspecified: Secondary | ICD-10-CM

## 2008-11-09 DIAGNOSIS — J309 Allergic rhinitis, unspecified: Secondary | ICD-10-CM | POA: Insufficient documentation

## 2008-11-13 ENCOUNTER — Ambulatory Visit (HOSPITAL_COMMUNITY): Payer: Self-pay | Admitting: Oncology

## 2008-11-13 ENCOUNTER — Encounter (INDEPENDENT_AMBULATORY_CARE_PROVIDER_SITE_OTHER): Payer: Self-pay | Admitting: Family Medicine

## 2008-12-17 ENCOUNTER — Ambulatory Visit (HOSPITAL_COMMUNITY): Admission: RE | Admit: 2008-12-17 | Discharge: 2008-12-17 | Payer: Self-pay | Admitting: Otolaryngology

## 2008-12-27 ENCOUNTER — Encounter (HOSPITAL_COMMUNITY): Admission: RE | Admit: 2008-12-27 | Discharge: 2009-01-26 | Payer: Self-pay | Admitting: Otolaryngology

## 2008-12-28 ENCOUNTER — Encounter (INDEPENDENT_AMBULATORY_CARE_PROVIDER_SITE_OTHER): Payer: Self-pay | Admitting: Diagnostic Radiology

## 2008-12-28 ENCOUNTER — Ambulatory Visit (HOSPITAL_COMMUNITY): Admission: RE | Admit: 2008-12-28 | Discharge: 2008-12-28 | Payer: Self-pay | Admitting: Otolaryngology

## 2009-02-01 ENCOUNTER — Encounter (HOSPITAL_COMMUNITY): Admission: RE | Admit: 2009-02-01 | Discharge: 2009-03-03 | Payer: Self-pay | Admitting: Otolaryngology

## 2009-02-05 ENCOUNTER — Ambulatory Visit: Payer: Self-pay | Admitting: Family Medicine

## 2009-02-05 LAB — CONVERTED CEMR LAB
Blood in Urine, dipstick: NEGATIVE
Ketones, urine, test strip: NEGATIVE
Protein, U semiquant: NEGATIVE
Urobilinogen, UA: 0.2
WBC Urine, dipstick: NEGATIVE

## 2009-02-12 ENCOUNTER — Encounter: Payer: Self-pay | Admitting: Gastroenterology

## 2009-02-19 ENCOUNTER — Encounter (INDEPENDENT_AMBULATORY_CARE_PROVIDER_SITE_OTHER): Payer: Self-pay | Admitting: Family Medicine

## 2009-02-26 ENCOUNTER — Ambulatory Visit: Payer: Self-pay | Admitting: Family Medicine

## 2009-03-21 ENCOUNTER — Ambulatory Visit (HOSPITAL_COMMUNITY): Admission: RE | Admit: 2009-03-21 | Discharge: 2009-03-21 | Payer: Self-pay | Admitting: Oncology

## 2009-03-21 ENCOUNTER — Encounter (INDEPENDENT_AMBULATORY_CARE_PROVIDER_SITE_OTHER): Payer: Self-pay | Admitting: Family Medicine

## 2009-03-22 LAB — CONVERTED CEMR LAB
ALT: 11 units/L (ref 0–35)
AST: 12 units/L (ref 0–37)
Alkaline Phosphatase: 72 units/L (ref 39–117)
Basophils Absolute: 0 10*3/uL (ref 0.0–0.1)
Basophils Relative: 0 % (ref 0–1)
CO2: 25 meq/L (ref 19–32)
Cholesterol: 201 mg/dL — ABNORMAL HIGH (ref 0–200)
Creatinine, Ser: 0.98 mg/dL (ref 0.40–1.20)
Eosinophils Absolute: 0.1 10*3/uL (ref 0.0–0.7)
LDL Cholesterol: 131 mg/dL — ABNORMAL HIGH (ref 0–99)
MCHC: 31.9 g/dL (ref 30.0–36.0)
MCV: 87.6 fL (ref 78.0–100.0)
Monocytes Relative: 5 % (ref 3–12)
Neutro Abs: 3.5 10*3/uL (ref 1.7–7.7)
Neutrophils Relative %: 63 % (ref 43–77)
Platelets: 224 10*3/uL (ref 150–400)
RBC: 4.29 M/uL (ref 3.87–5.11)
Sodium: 143 meq/L (ref 135–145)
Total Bilirubin: 0.4 mg/dL (ref 0.3–1.2)
Total CHOL/HDL Ratio: 4.3
Total Protein: 7.4 g/dL (ref 6.0–8.3)
VLDL: 23 mg/dL (ref 0–40)
WBC: 5.5 10*3/uL (ref 4.0–10.5)

## 2009-03-25 ENCOUNTER — Ambulatory Visit: Payer: Self-pay | Admitting: Family Medicine

## 2009-03-27 ENCOUNTER — Ambulatory Visit: Payer: Self-pay | Admitting: Family Medicine

## 2009-04-07 ENCOUNTER — Encounter (INDEPENDENT_AMBULATORY_CARE_PROVIDER_SITE_OTHER): Payer: Self-pay | Admitting: Family Medicine

## 2009-04-12 ENCOUNTER — Ambulatory Visit: Payer: Self-pay | Admitting: Family Medicine

## 2009-04-12 DIAGNOSIS — N3000 Acute cystitis without hematuria: Secondary | ICD-10-CM | POA: Insufficient documentation

## 2009-04-12 LAB — CONVERTED CEMR LAB
Glucose, Urine, Semiquant: NEGATIVE
Ketones, urine, test strip: NEGATIVE
Specific Gravity, Urine: 1.015
Urobilinogen, UA: 0.2

## 2009-04-22 ENCOUNTER — Ambulatory Visit (HOSPITAL_BASED_OUTPATIENT_CLINIC_OR_DEPARTMENT_OTHER): Admission: RE | Admit: 2009-04-22 | Discharge: 2009-04-22 | Payer: Self-pay | Admitting: Specialist

## 2009-04-22 ENCOUNTER — Encounter (INDEPENDENT_AMBULATORY_CARE_PROVIDER_SITE_OTHER): Payer: Self-pay | Admitting: Specialist

## 2010-04-14 ENCOUNTER — Ambulatory Visit (HOSPITAL_BASED_OUTPATIENT_CLINIC_OR_DEPARTMENT_OTHER): Admission: RE | Admit: 2010-04-14 | Discharge: 2010-04-14 | Payer: Self-pay | Admitting: Specialist

## 2010-05-03 ENCOUNTER — Emergency Department (HOSPITAL_COMMUNITY): Admission: EM | Admit: 2010-05-03 | Discharge: 2010-05-04 | Payer: Self-pay | Admitting: Emergency Medicine

## 2010-08-10 ENCOUNTER — Inpatient Hospital Stay (HOSPITAL_COMMUNITY)
Admission: EM | Admit: 2010-08-10 | Discharge: 2010-08-15 | Payer: Self-pay | Source: Home / Self Care | Attending: Internal Medicine | Admitting: Internal Medicine

## 2010-08-11 LAB — CBC
HCT: 40.3 % (ref 36.0–46.0)
Hemoglobin: 13.1 g/dL (ref 12.0–15.0)
MCH: 26.4 pg (ref 26.0–34.0)
MCHC: 32.5 g/dL (ref 30.0–36.0)
MCV: 81.1 fL (ref 78.0–100.0)
Platelets: 233 10*3/uL (ref 150–400)
RBC: 4.97 MIL/uL (ref 3.87–5.11)
RDW: 16.3 % — ABNORMAL HIGH (ref 11.5–15.5)
WBC: 13.1 10*3/uL — ABNORMAL HIGH (ref 4.0–10.5)

## 2010-08-11 LAB — LACTIC ACID, PLASMA: Lactic Acid, Venous: 1.8 mmol/L (ref 0.5–2.2)

## 2010-08-11 LAB — BASIC METABOLIC PANEL
BUN: 15 mg/dL (ref 6–23)
CO2: 28 mEq/L (ref 19–32)
Calcium: 9.5 mg/dL (ref 8.4–10.5)
Chloride: 98 mEq/L (ref 96–112)
Creatinine, Ser: 1.22 mg/dL — ABNORMAL HIGH (ref 0.4–1.2)
GFR calc Af Amer: 57 mL/min — ABNORMAL LOW (ref >60)
GFR calc non Af Amer: 47 mL/min — ABNORMAL LOW (ref >60)
Glucose, Bld: 94 mg/dL (ref 70–99)
Potassium: 3.1 mEq/L — ABNORMAL LOW (ref 3.5–5.1)
Sodium: 137 mEq/L (ref 135–145)

## 2010-08-11 LAB — DIFFERENTIAL
Basophils Absolute: 0 10*3/uL (ref 0.0–0.1)
Basophils Relative: 0 % (ref 0–1)
Eosinophils Absolute: 0.1 10*3/uL (ref 0.0–0.7)
Eosinophils Relative: 1 % (ref 0–5)
Lymphocytes Relative: 14 % (ref 12–46)
Lymphs Abs: 1.8 10*3/uL (ref 0.7–4.0)
Monocytes Absolute: 0.4 10*3/uL (ref 0.1–1.0)
Monocytes Relative: 3 % (ref 3–12)
Neutro Abs: 10.7 10*3/uL — ABNORMAL HIGH (ref 1.7–7.7)
Neutrophils Relative %: 82 % — ABNORMAL HIGH (ref 43–77)

## 2010-08-12 ENCOUNTER — Ambulatory Visit (HOSPITAL_COMMUNITY)
Admission: RE | Admit: 2010-08-12 | Discharge: 2010-08-12 | Payer: Self-pay | Source: Home / Self Care | Attending: Internal Medicine | Admitting: Internal Medicine

## 2010-08-13 LAB — BASIC METABOLIC PANEL
BUN: 8 mg/dL (ref 6–23)
CO2: 27 mEq/L (ref 19–32)
Calcium: 8.9 mg/dL (ref 8.4–10.5)
Chloride: 107 mEq/L (ref 96–112)
Creatinine, Ser: 1.01 mg/dL (ref 0.4–1.2)
GFR calc Af Amer: >60 mL/min (ref >60)
GFR calc non Af Amer: 58 mL/min — ABNORMAL LOW (ref >60)
Glucose, Bld: 87 mg/dL (ref 70–99)
Potassium: 3.5 mEq/L (ref 3.5–5.1)
Sodium: 139 mEq/L (ref 135–145)

## 2010-08-16 ENCOUNTER — Encounter: Payer: Self-pay | Admitting: Otolaryngology

## 2010-08-16 ENCOUNTER — Encounter: Payer: Self-pay | Admitting: Obstetrics & Gynecology

## 2010-08-17 ENCOUNTER — Encounter: Payer: Self-pay | Admitting: Otolaryngology

## 2010-08-17 ENCOUNTER — Encounter (HOSPITAL_COMMUNITY): Payer: Self-pay | Admitting: Oncology

## 2010-08-18 ENCOUNTER — Encounter (HOSPITAL_COMMUNITY): Payer: Self-pay | Admitting: Oncology

## 2010-08-18 LAB — CULTURE, BLOOD (ROUTINE X 2): Culture: NO GROWTH

## 2010-08-18 LAB — BASIC METABOLIC PANEL
CO2: 27 mEq/L (ref 19–32)
GFR calc Af Amer: >60 mL/min (ref >60)
GFR calc non Af Amer: 53 mL/min — ABNORMAL LOW (ref >60)
Glucose, Bld: 103 mg/dL — ABNORMAL HIGH (ref 70–99)
Potassium: 3.7 mEq/L (ref 3.5–5.1)
Sodium: 138 mEq/L (ref 135–145)

## 2010-08-18 LAB — CBC
HCT: 32.2 % — ABNORMAL LOW (ref 36.0–46.0)
Hemoglobin: 10.8 g/dL — ABNORMAL LOW (ref 12.0–15.0)
RDW: 15.5 % (ref 11.5–15.5)
WBC: 6.2 10*3/uL (ref 4.0–10.5)

## 2010-08-18 LAB — DIFFERENTIAL
Basophils Absolute: 0 10*3/uL (ref 0.0–0.1)
Basophils Relative: 1 % (ref 0–1)
Lymphocytes Relative: 39 % (ref 12–46)
Neutro Abs: 3.1 10*3/uL (ref 1.7–7.7)

## 2010-08-18 LAB — WOUND CULTURE

## 2010-08-21 LAB — CULTURE, BLOOD (ROUTINE X 2): Culture  Setup Time: 201201162119

## 2010-08-26 ENCOUNTER — Other Ambulatory Visit (HOSPITAL_COMMUNITY): Payer: Self-pay | Admitting: Oncology

## 2010-08-26 DIAGNOSIS — C50919 Malignant neoplasm of unspecified site of unspecified female breast: Secondary | ICD-10-CM

## 2010-09-03 ENCOUNTER — Encounter (HOSPITAL_COMMUNITY): Payer: Self-pay

## 2010-09-03 ENCOUNTER — Ambulatory Visit (HOSPITAL_COMMUNITY): Payer: Medicare Other

## 2010-09-10 ENCOUNTER — Ambulatory Visit (HOSPITAL_COMMUNITY)
Admission: RE | Admit: 2010-09-10 | Discharge: 2010-09-10 | Disposition: A | Discharge status: Home / Self Care | Payer: Medicare Other | Source: Ambulatory Visit | Attending: Oncology | Admitting: Oncology

## 2010-09-10 DIAGNOSIS — C50919 Malignant neoplasm of unspecified site of unspecified female breast: Secondary | ICD-10-CM

## 2010-09-10 DIAGNOSIS — Z853 Personal history of malignant neoplasm of breast: Secondary | ICD-10-CM | POA: Insufficient documentation

## 2010-09-26 ENCOUNTER — Ambulatory Visit (HOSPITAL_COMMUNITY): Payer: Medicare Other | Admitting: Oncology

## 2010-09-26 DIAGNOSIS — C50919 Malignant neoplasm of unspecified site of unspecified female breast: Secondary | ICD-10-CM

## 2010-10-09 LAB — CBC
HCT: 39.9 % (ref 36.0–46.0)
Hemoglobin: 9.7 g/dL — ABNORMAL LOW (ref 12.0–15.0)
MCH: 28.6 pg (ref 26.0–34.0)
MCHC: 32.6 g/dL (ref 30.0–36.0)
MCHC: 34 g/dL (ref 30.0–36.0)
MCV: 84.2 fL (ref 78.0–100.0)
MCV: 87.1 fL (ref 78.0–100.0)
Platelets: 351 10*3/uL (ref 150–400)
RBC: 3.4 MIL/uL — ABNORMAL LOW (ref 3.87–5.11)
RDW: 14.3 % (ref 11.5–15.5)

## 2010-10-09 LAB — DIFFERENTIAL
Basophils Absolute: 0 10*3/uL (ref 0.0–0.1)
Basophils Relative: 0 % (ref 0–1)
Basophils Relative: 0 % (ref 0–1)
Eosinophils Absolute: 0.1 10*3/uL (ref 0.0–0.7)
Eosinophils Absolute: 0.2 10*3/uL (ref 0.0–0.7)
Eosinophils Relative: 0 % (ref 0–5)
Eosinophils Relative: 3 % (ref 0–5)
Lymphs Abs: 1.4 10*3/uL (ref 0.7–4.0)
Monocytes Absolute: 0.3 10*3/uL (ref 0.1–1.0)
Monocytes Relative: 5 % (ref 3–12)

## 2010-10-09 LAB — POCT I-STAT, CHEM 8
Calcium, Ion: 1.12 mmol/L (ref 1.12–1.32)
HCT: 42 % (ref 36.0–46.0)
Hemoglobin: 14.3 g/dL (ref 12.0–15.0)
TCO2: 28 mmol/L (ref 0–100)

## 2010-10-09 LAB — BASIC METABOLIC PANEL
CO2: 25 mEq/L (ref 19–32)
Chloride: 95 mEq/L — ABNORMAL LOW (ref 96–112)
Creatinine, Ser: 1.12 mg/dL (ref 0.4–1.2)
GFR calc Af Amer: >60 mL/min (ref >60)
Glucose, Bld: 118 mg/dL — ABNORMAL HIGH (ref 70–99)

## 2010-10-31 LAB — BASIC METABOLIC PANEL
CO2: 30 mEq/L (ref 19–32)
Calcium: 8.9 mg/dL (ref 8.4–10.5)
Creatinine, Ser: 0.99 mg/dL (ref 0.4–1.2)
GFR calc Af Amer: >60 mL/min (ref >60)

## 2010-10-31 LAB — GLUCOSE, CAPILLARY: Glucose-Capillary: 147 mg/dL — ABNORMAL HIGH (ref 70–99)

## 2010-11-19 NOTE — Discharge Summary (Signed)
  [name], Gina Solis               ACCOUNT NO.:  [id number]  MEDICAL RECORD NO.:  [id number]          PATIENT TYPE:  INP  LOCATION:  A215                          FACILITY:  [facility]  PHYSICIAN:  Jeremy Mclamb D. Felecia Shelling, MD   DATE OF BIRTH:  12/18/1960  DATE OF ADMISSION:  08/10/2010 DATE OF DISCHARGE:  01/20/2012LH                              DISCHARGE SUMMARY   DISCHARGE DIAGNOSES: 1. Cellulitis of the left axilla. 2. Gram-positive sepsis. 3. History of cancer of the breast. 4. Hypertension.  DISPOSITION:  The patient was discharged home in stable condition.  HOSPITAL COURSE:  The patient is a 50 year old female patient with history of CA of the breast.  She had a previous surgery and reconstructive procedure.  She developed pain and swelling.  She was admitted as a cellulitis and grew Gram-positive cocci.  The patient was treated with IV antibiotics, improved, and discharged home in stable condition.     [name] D. Felecia Shelling, MD  TDF/MEDQ  D:  11/06/2010  T:  11/06/2010  Job:  [id number]  Electronically Signed by Avon Gully MD on 11/19/2010 08:33:09 AM

## 2010-12-09 NOTE — H&P (Signed)
NAMEMACRINA, Gina Solis               ACCOUNT NO.:  [id number]   MEDICAL RECORD NO.:  [id number]          PATIENT TYPE:  AMB   LOCATION:  DAY                           FACILITY:  [facility]   PHYSICIAN:  Dalia Heading, M.D.  DATE OF BIRTH:  05-25-1961   DATE OF ADMISSION:  DATE OF DISCHARGE:  LH                              HISTORY & PHYSICAL   CHIEF COMPLAINT:  Left breast carcinoma.   HISTORY OF PRESENT ILLNESS:  The patient is a 49 year old black female  who is referred for evaluation and treatment of left breast cancer.  She  had biopsy-proven left breast cancer with biopsy-proven metastases to  the left axilla.  She is being evaluated for possible neoadjuvant  chemotherapy and has enlarged lymph node in the base of the neck on the  left side.  Fine needle aspiration is negative, but it is felt the node  needs to be removed confirmed that this is a negative lymph node.   PAST MEDICAL HISTORY:  Hypertension.   PAST SURGICAL HISTORY:  Unremarkable.   CURRENT MEDICATIONS:  A blood pressure pill.   ALLERGIES:  No known drug allergies.   REVIEW OF SYSTEMS:  The patient denies smoking.  She does drink alcohol  socially.  She denies any other cardiopulmonary difficulties or bleeding  disorders.   PHYSICAL EXAMINATION:  GENERAL:  The patient is a well-developed, well-  nourished black female in no acute distress.  NECK:  Supple.  A palpable lymph node is noted in the base of the left  neck towards the posterior triangle.  LUNGS:  Clear to auscultation, with equal breath sounds bilaterally.  HEART:  Reveals a regular rate and rhythm, without S3, S4, or murmurs.   IMPRESSION:  Left breast carcinoma.   PLAN:  The patient is scheduled to undergo port-A-Cath insertion, as  well as a biopsy of lymph node in the left neck on February 07, 2007.  The  risks and benefits of the procedure, including bleeding, infection,  pneumothorax, and the possibility of nerve injury were fully explained  to  the patient, who gave informed consent.      Dalia Heading, M.D.  Electronically Signed     MAJ/MEDQ  D:  02/03/2007  T:  02/04/2007  Job:  [id number]   cc:   [facility]  Fax: [phone]   Ladona Horns. Mariel Sleet, MD  Fax: [phone]   Lazaro Arms, M.D.  Fax: [phone]

## 2010-12-09 NOTE — Discharge Summary (Signed)
Gina Solis, Gina Solis               ACCOUNT NO.:  [id number]   MEDICAL RECORD NO.:  [id number]          PATIENT TYPE:  INP   LOCATION:  A325                          FACILITY:  [facility]   PHYSICIAN:  Osvaldo Shipper, MD     DATE OF BIRTH:  03/26/1961   DATE OF ADMISSION:  03/08/2007  DATE OF DISCHARGE:  08/19/2008LH                               DISCHARGE SUMMARY   PRIMARY CARE PHYSICIAN:  Franchot Heidelberg, M.D.   ONCOLOGIST:  Ladona Horns. Mariel Sleet, M.D.   DERMATOLOGIST:  Leticia Clas, M.D.   Please review H&P dictated by Dr. Lilian Kapur for details regarding the  patient's presenting illness.   DISCHARGE DIAGNOSES:  1. Infected Port-A-Cath.  2. Staph aureus bacteremia, methicillin-sensitive Staphylococcus      aureus.  3. Breast cancer.  4. Hypertension.  5. Dyslipidemia.   HOSPITAL COURSE:  Problem 1.  Infected Port-A-Cath.  The patient is a 50-  year-old African-American female who was diagnosed with breast cancer a  couple of months ago and who had a Port-A-Cath placement a few weeks  ago, who presented to her doctor's office complaining of generalized  pain and pus drainage from her Port-A-Cath site.  The patient was  admitted directly to the hospital.  She was seen by Dr. Malvin Johns who  removed the infected Port-A-Cath.  Blood cultures and wound cultures  were sent and the cultures grew Staph aureus sensitive to oxacillin.   The patient was having generalized body aches and joint aches.  This was  all most likely secondary to her infection.  Incidentally her CK was  also elevated at 4000, this was probably rhabdomyolysis secondary to  infection as well.  CK has come down to 346 today.  She has been  afebrile for the past 5-6 days.  I think overall the patient is  significantly improved.  I did discuss this case with Cliffton Asters,  M.D., infectious disease, and he said that if the two-dimensional  echocardiogram looks okay the patient could be treated about 4 weeks if  we are  not going to do a TEE.  If the TEE is done and if it is negative  for endocarditis, she could suffice treatment with 2 weeks.  Just down  Dr. Mariel Sleet spoke to me and he would like to get this patient started  on a chemotherapy regimen as soon as possible.  Hence, he is requesting  that the patient have a TEE.  Since they have already set up all of the  discharge planning, I am going to try to set this up as an outpatient  with [facility].  I will try to arrange that today.   Problem 2.  Right shoulder pain.  The patient today is complaining of  some right shoulder pain.  She is able to move it, but she does have  some tenderness in movement.  She said the pain started last night.  She  thinks she slept on it in the wrong way.  I have told her to take some  over-the-counter Tylenol and if the pain worsens or if there is no  relief she needs to contact her PMD as soon as possible.   Problem 3.  Breast cancer.  She will be started on chemotherapy per Dr.  Mariel Sleet.   Problem 4.  Anemia likely from chronic disease.  No evidence for acute  blood loss.   Problem 5.  Allergic reaction.  The patient presented with facial  swelling and a skin rash a few weeks ago.  She has been followed by  dermatologist, Dr. Margo Aye.  He has had her on steroid taper a few times.  Dr. Mariel Sleet thinks that this could be perineoplastic process.  She  will be continued on prednisone at home and asked to follow up with Dr.  Margo Aye.   Problem 6.  Hypertension is stable.  She is also hyperglycemic which is  likely coming from the steroids which should also improve as she goes  off the prednisone.  She had a little abnormal LFT's which is secondary  to sepsis probably.  Hepatitis screen was negative.  Ultrasound was  negative for any lesions in the liver.  Fatty liver was noticed.   Today, the patient is complaining of the right shoulder pain.  Examination reveals that she is able to move it, though with  some  tenderness on movement.  I do not suspect any fractures at this time.  Vital signs are stable.  The rest of the examination is unremarkable.  Blood work shows her hemoglobin today is 10.1, white count is improved  to 15.7, likely elevation of white count from prednisone.  Rest of the  labs look okay.  The CK is coming down as well.   ASSESSMENT:  As per above.   DISCHARGE MEDICATIONS:  1. Ancef 2 grams intravenously every 8 hours for 1 week or 3 weeks      depending on the TEE.  If the TEE is positive, she will need it for      3 weeks.  2. Prednisone 20 mg for 7 days and then 10 mg for 7 days.  3. Norvasc.  Unfortunately, I do not know the dose.  4. Hydrochlorothiazide as before.  5. Nexium as before.  6. Tylenol as needed.   FOLLOWUP:  Follow up with Dr. Mariel Sleet, Dr. Margo Aye, and Dr. Erby Pian.  We will try to set up this patient for an outpatient TEE with [facility].   PROCEDURE:  Removal of infected Port-A-Cath.   CONSULTATIONS:  1. Dr. Mariel Sleet.  2. Phone discussion with Dr. Orvan Falconer, infectious disease.   Total time of discharge 45 minutes.      Osvaldo Shipper, MD  Electronically Signed     GK/MEDQ  D:  03/15/2007  T:  03/15/2007  Job:  [id number]   cc:   Ladona Horns. Mariel Sleet, MD  Fax: [phone]   Franchot Heidelberg, M.D.   Mertha Finders., M.D.  Fax: [phone]

## 2010-12-09 NOTE — Group Therapy Note (Signed)
NAMELAKEITA, Gina Solis               ACCOUNT NO.:  [id number]   MEDICAL RECORD NO.:  [id number]          PATIENT TYPE:  INP   LOCATION:  A325                          FACILITY:  [facility]   PHYSICIAN:  Skeet Latch, DO    DATE OF BIRTH:  [birth year]   DATE OF PROCEDURE:  03/12/2007  DATE OF DISCHARGE:                                 PROGRESS NOTE   SUBJECTIVE:  Ms. Gina Solis is a 50 year old African-American female who  presented from Dr. Claire Shown office with a complaint of joint pain and  drainage from her Porta-Cath. The patient was diagnosed with breast  cancer approximately one to two months prior. The patient had a breast  biopsy done for further diagnosis and developed a systemic allergic  reaction and has been having facial swelling ever since. The patient was  sent to the dermatologist due to this swelling and allergic reaction  with rash. Biopsies were performed and the patient has been treated with  steroids. Her rash has been waxing and waning, as well as her facial  swelling. Dr. Erby Pian called me regarding the patient's drainage from  her Porta-Cath and stated that secondary to her recent diagnosis of  breast cancer and possible infection, the patient needed to be admitted  to the hospital. The patient also stated that she has been having joint  pain and fever upon being admitted to the hospital. The patient was  found to have an elevated creatinine kinase upon admission. This has  been steadily improving with IV hydration and the patient's joint pain  has been improving also. The patient did have positive blood cultures  and has been placed on IV antibiotics, and overall, the patient states  that she is feeling much better.   OBJECTIVE:  VITAL SIGNS:  Temperature 98.3, pulse 97, respirations 20,  blood pressure 136/68, O2 saturation is 98% on room air.  CARDIOVASCULAR:  Regular rate and rhythm, no murmurs, rubs, or gallops.  LUNGS:  Clear to auscultation. No rales, rhonchi,  or wheezing.  ABDOMEN:  Soft and nontender, nondistended, no rigidity, masses, with  positive bowel sounds.  EXTREMITIES:  Some trace edema. No cyanosis, clubbing, or erythema.  SKIN:  The patient does have excoriated areas where she has been  scratching on her chest and back.  HEENT:  Positive facial swelling that seems to be improved today. This  has been waxing and waning.   RADIOLOGY:  A right hip x-ray showed no acute bony abnormalities.   ASSESSMENT AND PLAN:  1. Breast carcinoma. The patient is being followed by oncology and is      currently stable. We will follow the patient's white count, as well      as any elevations in her temperature.  2. Allergic reaction of unknown origin. The patient continues to be on      Benadryl. Her IV steroids were switched to p.o. Her facial swelling      is waxing and waning, but seems to be improving at this time.  3. Bacteremia. The patient is on IV antibiotics with Zosyn and      vancomycin.  This will be continued at this time. We will defer to      oncology regarding how many days of IV antibiotics the patient will      need.  4. Hypertension and hyperlipidemia. These seem to be stable. The      patient is not on any statins at this time secondary to her      elevated creatinine kinase. We will continue to monitor.   The patient will need to stay to be seen by Dr. Mariel Sleet on Monday  regarding her course of treatment and secondary to her positive blood  cultures. She needs to be continued on IV antibiotics at this time.  Overall, the patient seems to be improving and will continue to be  monitored.      Skeet Latch, DO  Electronically Signed     SM/MEDQ  D:  03/12/2007  T:  03/13/2007  Job:  [id number]

## 2010-12-09 NOTE — Group Therapy Note (Signed)
NAMECIRIA, BERNARDINI               ACCOUNT NO.:  [id number]   MEDICAL RECORD NO.:  [id number]          PATIENT TYPE:  INP   LOCATION:  A325                          FACILITY:  [facility]   PHYSICIAN:  Skeet Latch, DO    DATE OF BIRTH:  January 01, 1961   DATE OF PROCEDURE:  03/11/2007  DATE OF DISCHARGE:                                 PROGRESS NOTE   SUBJECTIVE:  Mrs. Furnish states that last evening she began to have what  she believes may be an allergic reaction of some kind.  The patient  states that her face started to swell again, but this morning has  improved.  The patient still complains of right leg/hip pain today.  The  patient states the achiness in her joints is slowly improving.  She  denies any chest pain, abdominal pains, or urinary complaints.  Overall  states that she does feel better.  The patient is ambulating in her room  from bed to chair.   OBJECTIVE:  VITAL SIGNS:  Temperature 97.6, pulse 87, respirations 18,  blood pressure 133/79.  The patient satting 100% on room air.  CARDIOVASCULAR:  Regular rate and rhythm.  No murmurs, gallops or rubs.  Lungs clear auscultation bilaterally.  No rales, rhonchi or wheezing.  ABDOMEN:  Soft, nontender, nondistended.  No clubbing, cyanosis, or  edema.  EXTREMITIES:  Right leg pain/rate hip pain.  The patient will not move  her right leg in flexion.   LABORATORY DATA:  White count 11.8, hemoglobin 10, hematocrit 29.8,  platelets 249,000.  Sodium 138, potassium 4.0, chloride 1038, CO2 28,  glucose 117, BUN 9, creatinine 0.89, AST 81, ALT 63.  Her total  creatinine kinase as of 1822, CK-MB is 12.8.  Final wound culture,  moderate staph aureus.  Blood cultures positive for gram-positive cocci.  Urine culture no growth.   ASSESSMENT/PLAN:  1. Breast cancer.  Seems to be stable at this time.  The patient will      receive chemo as an outpatient.  Will be followed by oncology.  2. Possible allergic reaction.  The patient continues to  be on      Benadryl and IV steroids.  Will switch patient to p.o. steroids at      this time.  Facial swelling seems to be improving.  3. Bacteremia.  The patient has been on IV antibiotics.  Continue to      follow her white count as well as her temperature.  4. The patient will continue with DVT prophylaxis.  5. For her right leg pain, will get an x-ray of her right hip/leg to      rule out any pathological fractures.      Skeet Latch, DO  Electronically Signed     SM/MEDQ  D:  03/11/2007  T:  03/12/2007  Job:  [id number]

## 2010-12-09 NOTE — Op Note (Signed)
NAMETIMERA, WINDT NO.:  [id number]   MEDICAL RECORD NO.:  [id number]          PATIENT TYPE:  INP   LOCATION:  A325                          FACILITY:  [facility]   PHYSICIAN:  Barbaraann Barthel, M.D. DATE OF BIRTH:  1961/03/17   DATE OF PROCEDURE:  03/08/2007  DATE OF DISCHARGE:                               OPERATIVE REPORT   SURGEON:  Barbaraann Barthel, M.D.   PREOPERATIVE DIAGNOSIS:  Infected right subclavian Port-A-Cath.   POSTOPERATIVE DIAGNOSIS:  Infected right subclavian Port-A-Cath.   NOTE:  This is a 50 year old black female who had diagnosed breast  cancer and had a right Port-A-Cath placed on July 28.  by Dr. Lovell Sheehan.  This had pus and was draining and I was called today by Dr. Franchot Heidelberg for removal.  The patient was admitted directly from his office  to the hospitalist and I was consulted.  We took the patient immediately  to surgery and under local anesthesia, we planned to remove this.   SPECIMEN:  Port-A-Cath, this was not sent, and cultures which were  obtained from the drainage site.   TECHNIQUE:  The patient was placed in the supine position and after  prepping with Betadine solution and draping in the usual manner, I used  1% Xylocaine without epinephrine and I anesthetized the area where the  incision was made for placement of the Port-A-Cath.  There was some  necrotic tissue here which I sharply debrided and then I removed the  Port-A-Cath and then irrigated the site copiously with normal saline  solution and packed the wound open with dilute iodoform gauze that had  been soaked in saline.  A sterile dressing was applied.   POSTOPERATIVE INSTRUCTIONS:  There will be no change in service, this  patient will stay on the hospitalist service.  I recommend that a PICC  line be placed if IV access is required and this could be ordered for  tomorrow. I placed her on antibiotics, 750 mg Levaquin p.o., and she  will be followed by the  hospitalist team.   DRESSING CHANGES:  The wound should be irrigated with normal saline  solution and packed with saline soaked gauze b.i.d.   I will follow this patient while the hospital after which she will be  followed again by Dr. Lovell Sheehan.     Barbaraann Barthel, M.D.  Electronically Signed    WB/MEDQ  D:  03/08/2007  T:  03/09/2007  Job:  [id number]   cc:   Franchot Heidelberg, M.D.

## 2010-12-09 NOTE — Consult Note (Signed)
NAMEEBONIE, WESTERLUND               ACCOUNT NO.:  [id number]   MEDICAL RECORD NO.:  [id number]          PATIENT TYPE:  AMB   LOCATION:  DAY                           FACILITY:  [facility]   PHYSICIAN:  Kassie Mends, M.D.      DATE OF BIRTH:  [birth year]   DATE OF CONSULTATION:  04/27/2007  DATE OF DISCHARGE:                                 CONSULTATION   REASON FOR CONSULTATION:  Severe GERD, problems swallowing.   HISTORY OF PRESENT ILLNESS:  The patient is a pleasant 50 year old lady  who presents for further evaluation of refractory GERD type symptoms and  a history of problem swallowing.  She states she has had problems  swallowing for several months, but it seems to be getting worse.  She  has history of chronic GERD which had been well-controlled on Nexium 40  mg daily, but lately she is having refractory symptoms.  She wakes up in  the morning.  Has a lot of phlegm in her throat which she tries to get  out with coughing, etc.  This tends to get better at the day goes  through.  She denies any chronic cough or shortness of breath.  She has  also noted hoarseness more lately which she can be intermittent.  Seems  to get better throughout the end of the day.  At times she can barely  even talk.  When she tries to eat or drink anything she feels like she  gets choked which she describes as air being cut off.  This is not  always associated with coughing.  She feels like she has food that hangs  in the chest area.  This causes some discomfort.  She states she cannot  take her larger pills such as potassium.  She cannot get the pills to  even go down.  She was diagnosed with breast cancer in June of this  year.  She is to receive her fourth cycle of chemotherapy next  Wednesday.  She has had some alternating constipation and diarrhea since  she has been on chemotherapy.  She has had some associated nausea and  loss of appetite.  She states she has lost about 16 pounds.  She is  scheduled to  see Dr. Derrell Lolling in [place] this Friday to talk about  surgery for her breast cancer.  She is also scheduled to go to [facility] on  May 12, 2007 for further evaluation of her hoarseness.  Also she was  found to have thyroid nodules which need further evaluation.  She was  found to have thyroid nodules on the CT of the chest done to rule out  SVC syndrome.  She had ultrasound which revealed bilateral nonspecific  thyroid nodules with evidence of internal blood flow on Doppler imaging.  The largest lesion was in the right thyroid lobe and measured 4 cm.  A  smaller lesion on the left measured 2 cm.  A PET scan January 31, 2007  demonstrated no hypermetabolic uptake within the thyroid gland.  She  denies any melena or rectal bleeding.  She states that she  is still  recovering from recent hospitalization about a month ago.  She had  infection of her Port-A-Cath which grew methicillin-sensitive staph  aureus.  The infected Port-A-Cath was removed.  She was treated for  infection.  She developed significant body aches and joint pain with  elevated CK felt to be due to rhabdomyolysis from the infection.  She  underwent transesophageal echocardiogram which was unremarkable.  She  took two weeks of antibiotic therapy.  She states she then developed  oral candidiasis and was on something for that as well.   CURRENT MEDICATIONS:  1. Nexium 40 mg daily.  2. Benicar HCT 40/12.5 mg daily.  3. Simvastatin 20 mg daily.  4. Ativan 1 mg p.r.n.  5. Compro 25 mg suppositories every six hours as needed for nausea.  6. Norvasc 5 mg daily.   ALLERGIES:  No known drug allergies.   PAST MEDICAL HISTORY:  Hypertension, hypercholesterolemia, chronic GERD,  anxiety, breast cancer diagnosed June 2008 as outlined above. Currently  undergoing chemotherapy prior to surgical intervention.  She has had  facial swelling.  She recently was diagnosed with thyroid nodules as  outlined above.  She has hoarseness.   Hyperlipidemia.  She has had a  tubal ligation and partial hysterectomy.  Port-A-Cath placement as  outlined above.  Left breast biopsy positive for breast cancer June  2008.   FAMILY HISTORY:  Mother at 70 has a history of renal failure and CHF.  Father at 68 has history of GERD.  She has maternal uncle who died with  colon cancer in his 81's.   SOCIAL HISTORY:  She is separated for 16 years.  She has three children.  She has never been a smoker.  Does not use alcohol or drugs. She is on  disability.   PHYSICAL EXAMINATION:  VITAL SIGNS:  Weight 227, height 5 feet 2, temp  98.3, blood pressure 110/80, pulse 64.  GENERAL:  Pleasant obese female in no acute distress.  SKIN:  Warm and dry.  No jaundice.  HEENT:  Sclerae nonicteric.  Oropharyngeal mucosa moist and pink, face  full but symmetrical.  No lymphadenopathy.  Her thyroid gland appears to  be enlarged, but I did not appreciate a discrete mass or nodule.  No  evidence of persistent oral candidiasis.  CHEST:  Lungs are clear to auscultation.  CARDIAC:  Reveals regular rate and rhythm.  No murmurs, rubs or gallops.  ABDOMEN:  Positive bowel sounds, obese but symmetrical, soft.  Nontender.  No organomegaly or masses.  No rebound tenderness or  guarding.  No abdominal bruits or hernias.  EXTREMITIES:  No edema.   LABORATORY DATA:  September 24 BUN 10, creatinine 0.83.  WBC 7200,  hemoglobin 10, hematocrit 29.8, MCV 88.3, platelets 288,000.  TSH 0.891.  March 11, 2007 total bilirubin 0.5, alkaline phosphatase 61.  AST 81,  ALT 63.  Albumin 2.4.  Hepatitis B surface antigen negative, hepatitis B  core antibody IgM negative, hepatitis A IgM negative, hepatitis C  antibody negative.  Looking back around February 16, 2007 her hemoglobin was  normal at 12.6.   She had a barium esophagram on April 25, 2007 which revealed GE  reflux, minimal hiatal hernia, no stricture.  Chest CT on March 30, 2007: She had mild diffuse fatty  liver, no significant change in left  axillary and subpectoralis metastatic adenopathy, interval decrease in  size of left adrenal adenoma, a metastasis less likely, right node  thyroid masses.  Abdominal ultrasound  on March 09, 2007 revealed a  heterogeneous echogenicity of the liver but no focal hepatic lesions.  CBD was 5 mm.  Gallbladder was full but no stones or wall thickening or  pericholecystic fluid.   IMPRESSION:  Ms. Rudy is a 50 year old lady with a history of breast  cancer diagnosed in June of this year.  She has metastases to the left  axillary and left retropectoral lymph nodes.  She is currently  undergoing chemotherapy.  She will be undergoing surgical resection in  the near future with Dr. Derrell Lolling in [place].  She presents for further  evaluation of worsening dysphagia and refractory GERD type symptoms.  Recent barium esophagram did not reveal any stricture, but she did have  evidence of active GE reflux.  She is on Nexium 40 mg in the morning.  She is waking up in the morning times with phlegm in her throat,  hoarseness which could be due to GERD/LPR.  She describes having  difficulty swallowing liquids and solid foods and feeling like the food  is hanging in her chest.  She also points to her throat stating it feels  like it is hanging there as well.  Some of this may be due to her  enlarged thyroid with nodules.  She also recently had oral candidiasis  and did take some sort rate of antifungal therapy for that.  She is on  chemotherapy.  Cannot exclude the possibility of candidiasis esophagitis  contributing to these symptoms.  Given  refractory GERD type symptoms,  worsening dysphagia, etc., I have offered her upper endoscopy for  further evaluation.  It was noted that the patient had elevated  transaminases during her recent hospitalization.  Her AST was 81, ALT  63.  Potentially due to fatty liver versus related to chemotherapy or  other medications.    PLAN:  1. EGD with Dr. Cira Servant in the near future.  2. I have asked her to increase her Nexium to 40 mg q.a.m. and q.p.m.      30 minutes before an evening meal.  Encouraged her to keep her EENT      appointment scheduled.  3. Further recommendations to follow.  4. Consider repeating LFT's with any upcoming lab work.      Tana Coast, P.A.      Kassie Mends, M.D.  Electronically Signed    LL/MEDQ  D:  04/27/2007  T:  04/28/2007  Job:  [id number]   cc:   Franchot Heidelberg, M.D.

## 2010-12-09 NOTE — Op Note (Signed)
NAMENIKAYA, NASBY               ACCOUNT NO.:  [id number]   MEDICAL RECORD NO.:  [id number]          PATIENT TYPE:  AMB   LOCATION:  DAY                           FACILITY:  [facility]   PHYSICIAN:  Kassie Mends, M.D.      DATE OF BIRTH:  1961/07/03   DATE OF PROCEDURE:  05/02/2007  DATE OF DISCHARGE:                               OPERATIVE REPORT   PROCEDURE:  Esophagogastroduodenoscopy with brush and cold forceps  biopsies of the esophagus.   INDICATIONS FOR PROCEDURE:  Ms. Castello is a 50 year old female who  complains of persistent burning in her chest and pain with swallowing.  She has significant past medical history of being diagnosed with breast  cancer in June 2008.  Her last dose of chemotherapy was 2 weeks ago.  She has been having problems swallowing solids and liquids for the past  2 weeks.  Her breast cancer is metastatic to her nodes in the left  axilla as well as her left pectoralis area.  She also has a history of  oral candidiasis.   FINDINGS:  1. White plaques seen from the proximal to the distal esophagus.      Shallow ulcerations with mild erythema seen scattered throughout      the esophagus most pronounced in the distal esophagus.  No erythema      noted.  Brush biopsies were obtained and sent for KOH prep and      herpes simplex virus culture.  Biopsies were also obtained via cold      forceps.  2. Small hiatal hernia, otherwise normal stomach.  3. Normal duodenal bulb and second portion of the duodenum.   DIAGNOSIS:  Esophagitis likely secondary to Candida.  The differential  diagnosis includes herpes simplex virus.   RECOMMENDATIONS:  1. Will begin Diflucan and continue for 21 days.  The patient should      see a significant improvement in her pain with swallowing of the      next 2-3 days.  She may also use viscous lidocaine 10 mL every 6      hours as needed for pain with swallowing.  2. She should follow a soft diet.  She is given a handout on soft  diet.  3. Will call Ms. Christian with the results of her biopsy and cultures.   MEDICATIONS:  1. Demerol 50 mg IV.  2. Versed 5 mg IV.   PROCEDURE TECHNIQUE:  Physical exam was performed.  Informed consent was  obtained from the patient after explaining benefits, risks and  alternatives to the procedure.  The patient was connected to monitor and  placed in left lateral position.  Continuous oxygen was provided by  nasal cannula and IV medicine administered through an indwelling  cannula.  After administration of sedation, the patient's esophagus was  intubated and the scope was  advanced under direct visualization to the cecum.  The scope was removed  slowly by carefully examining the color, texture, anatomy and integrity  of the mucosa on the way out.  The patient was recovered in endoscopy  and discharged home in  satisfactory condition.      Kassie Mends, M.D.  Electronically Signed     SM/MEDQ  D:  05/02/2007  T:  05/02/2007  Job:  [id number]   cc:   Franchot Heidelberg, M.D.

## 2010-12-09 NOTE — Letter (Signed)
March 16, 2007    Franchot Heidelberg, M.D.  [address]  [address]  [place], [place] [address]   RE:  Gina, Solis  MRN:  [id number]  /  DOB:  09-07-1960   Dear Remi Haggard:   It was my pleasure evaluating Gina Solis in the office today in  consultation at your request.  As you know, this nice woman was recently  diagnosed with carcinoma of the breast.  A right subcutaneous port was  implanted approximately 2 weeks ago.  Soon thereafter, she developed  drainage from the insertion site and then full Staph aureus sepsis.  She  was discharged from the hospital yesterday after an initial course of  intravenous antibiotics with a good response.  She had a transesophageal  echocardiogram that was unremarkable.  In order to minimize her course  of antibiotics so that chemotherapy can be initiated promptly,  transesophageal echocardiography is planned to rule out endocarditis.   Gina Solis has no known cardiovascular disease.  She has not previously  been seen by a cardiologist, nor undergone any significant cardiac  testing.  She does have hypertension and hyperlipidemia that have been  effectively treated medically.   Past medical history is otherwise unremarkable.  She does not received  influenza vaccine routinely and has not received pneumococcal vaccine.   She has no known drug allergies, but did develop a skin rash, facial  swelling and desquamation of the skin of her fingers soon after her  catheter was implanted.  A skin biopsy apparently did not yield specific  diagnosis.   Current medications include vancomycin, amlodipine 10 mg daily,  hydrochlorothiazide 25 mg daily, simvastatin one daily, Nexium 40 mg  daily.   On exam, a pleasant woman in no acute distress.  The weight is 230  pounds.  Blood pressure 125/80, heart rate 85 and regular, respirations  18.  HEENT:  Unremarkable.  Neck:  No jugulovenous distention; no  carotid bruits.  Lungs:  Clear.  Cardiac:  Split  first heart sounds;  normal second heart sounds; no murmur nor gallop appreciated.  Abdomen:  Soft and nontender; no masses; no organomegaly.  Extremities:  Trace  edema.   IMPRESSION:  Gina Solis developed catheter infection.  She has improved  with explantation of her catheter and appropriate antibiotic therapy.  We will proceed with a transesophageal echocardiogram tomorrow and let  you and Dr. Mariel Sleet know the results.  I think her likelihood of  endocarditis is low, and hopefully the remainder of her treatment for  carcinoma of the breast will be uncomplicated.   Thanks so much for sending this nice woman to me.    Sincerely,      Gerrit Friends. Dietrich Pates, MD, [facility]  Electronically Signed    RMR/MedQ  DD: 03/16/2007  DT: 03/17/2007  Job #: [id number]   CC:    Ladona Horns. Mariel Sleet, MD

## 2010-12-09 NOTE — Op Note (Signed)
Gina Solis, Gina Solis               ACCOUNT NO.:  [id number]   MEDICAL RECORD NO.:  [id number]          PATIENT TYPE:  AMB   LOCATION:  DAY                           FACILITY:  [facility]   PHYSICIAN:  Dalia Heading, M.D.  DATE OF BIRTH:  1961/07/09   DATE OF PROCEDURE:  02/21/2007  DATE OF DISCHARGE:                               OPERATIVE REPORT   PREOPERATIVE DIAGNOSIS:  Left breast carcinoma.   POSTOPERATIVE DIAGNOSIS:  Left breast carcinoma.   PROCEDURE:  Left neck lymph node biopsy, Port-A-Cath insertion.   SURGEON:  Dr. Franky Macho.   ANESTHESIA:  General.   INDICATIONS:  The patient is a 50 year old year old black female who has  biopsy-proven left breast cancer with a biopsy-proven metastasis to the  left axilla.  She is being evaluated for possible neoadjuvant  chemotherapy and has an enlarged lymph node at the base of the neck on  the left side.  She also needs a Port-A-Cath placed for chemotherapy.  The risks and benefits of both procedures including bleeding, infection,  nerve injury, and pneumothorax were fully explained to the patient, who  gave informed consent.   PROCEDURE NOTE:  The patient was placed in supine position.  The left  lower neck was prepped and draped in the usual sterile technique with  Betadine after induction of general endotracheal anesthesia.  Surgical  site confirmation was performed.   A small incision was made along the base of the neck.  This was the base  of the posterior triangle.  Sharp dissection was taken down to just  below the muscle layer where a slightly enlarged lymph node was found.  This was excised using a peanut and blunt dissection.  Specimen sent to  pathology further examination.  A nerve stimulator was present if  needed, but as the lymph node was dissected free without extensive  dissection, no nerve stimulator was used.  No abnormal bleeding was  noted at the end of procedure.  The subcutaneous layer was  reapproximated using a 4-0 Vicryl interrupted suture.  The skin was  closed using a 5-0 Vicryl subcuticular suture.  Dermabond was then  applied.   Next, the right upper chest was prepped and draped in the usual sterile  technique with Betadine.  Surgical site confirmation was performed.  The  patient was placed in Trendelenburg position.  Transverse incision was  made below the right clavicle.  Subcutaneous pocket was then formed.  Needle was advanced into the right subclavian vein using the Seldinger  technique without difficulty.  Guidewire was then advanced under digital  fluoroscopy into the right atrium.  An introducer and peel-away sheath  were placed over the guidewire.  The catheter was then inserted through  the peel-away sheath and the peel-away sheath was removed.  The catheter  was sized and attached to the port.  The port was placed in subcutaneous  pocket.  Adequate position was confirmed by fluoroscopy.  The port was  flushed with 3000 units of heparin.  The subcutaneous layer was  reapproximated using a 3-0 Vicryl interrupted suture.  The skin  was  closed using a 4-0 Vicryl subcuticular suture.  Dermabond was then  applied.   All tape and needle counts correct end of the procedure.  The patient  was extubated in the operating room went back to recovery room awake in  stable condition.  Chest x-ray will be performed at that time.   COMPLICATIONS:  None.   SPECIMEN:  Lymph node, left neck.   BLOOD LOSS:  Minimal.      Dalia Heading, M.D.  Electronically Signed     MAJ/MEDQ  D:  02/21/2007  T:  02/22/2007  Job:  [id number]   cc:   Lazaro Arms, M.D.  Fax: [phone]   Ladona Horns. Mariel Sleet, MD  Fax: [phone]

## 2010-12-09 NOTE — Op Note (Signed)
NAMEAZIYA, ARENA               ACCOUNT NO.:  [id number]   MEDICAL RECORD NO.:  [id number]          PATIENT TYPE:  OIB   LOCATION:  5705                         FACILITY:  [facility]   PHYSICIAN:  Angelia Mould. Derrell Lolling, M.D.DATE OF BIRTH:  12/17/1960   DATE OF PROCEDURE:  07/11/2007  DATE OF DISCHARGE:                               OPERATIVE REPORT   PREOPERATIVE DIAGNOSIS:  Invasive mammary carcinoma, left breast with  metastasis to left axillary lymph nodes.   POSTOPERATIVE DIAGNOSIS:  Invasive mammary carcinoma, left breast with  metastasis to left axillary lymph nodes.   OPERATION PERFORMED:  1. Left partial mastectomy with needle localization specimen      mammogram.  2. Re-excision of superior margin of left lumpectomy site.  3. Left axillary lymph node dissection.   SURGEON:  Dr. Claud Kelp.   OPERATIVE INDICATIONS:  This is a 50 year old black female who was  diagnosed with cancer of left breast in June of this year.  Ultrasound  showed a 2.3 cm mass in the upper outer quadrant of the left breast.  Biopsy showed invasive mammary carcinoma.  This was estrogen and  progesterone receptor negative and HR2 negative.  She also underwent  image guided biopsy of a left axillary lymph node which showed  metastatic cancer.  She had a biopsy of left neck lymph node which was  benign.  She had an MRI which confirmed the ultrasound and mammogram  findings.  She had a Port-A-Cath inserted in the right side and got a  staph infection and that had to be removed.  She got a skin infection of  her left breast which was biopsied and was benign and is now healed.  She had protocol chemotherapy under direction of Dr. Glenford Peers in  August.  I became involved with her on April 29, 2007 when Dr.  Mariel Sleet sent her to me.  We evaluated her several times.  At first she  was going to have a mastectomy but then decided she wanted to have a  lumpectomy and we told her that we would attempt to  do that although we  were uncertain of how well we could do with the margins.  She is brought  to operating room electively.   OPERATIVE TECHNIQUE:  The patient underwent needle localization at the  breast center of [facility] this morning.  The localizing wire went  through the marker clip and went several centimeters beyond it.  This  was easy to follow.  The patient was taken to operating room.  General  anesthesia was induced.  The patient was identified as correct patient  and correct procedure and correct site.  Intravenous antibiotics were  given.  The left breast and left axilla were prepped and draped in  sterile fashion.  0.5% Marcaine with epinephrine was used as local  infiltration anesthetic.   I made a curved circumareolar type incision in the upper outer quadrant  of the left breast.  I took an ellipse of skin with this incision.  Using electrocautery I went down into the breast tissue and went widely  around the  wire all the way down to the lateral aspect of the pectoralis  major muscle.  At no time did I encountered the wire.  Specimen  mammogram was performed and I was told that it looked good with a marker  in the center of the specimen.  Dr. Laureen Ochs in pathology felt that it was  difficult to evaluate this tissue because of the neoadjuvant therapy  with thought it was more firm in the superior aspect and he asked that I  re-excise the superior margin.  Then went back and re-excised about a 4  cm x 6 cm area of the superior margin and then re-marked the superior  margin with a Vicryl suture and sent this to the lab for routine  histology.  Hemostasis was excellent and achieved with electrocautery.  Wound was irrigated with saline.  The more superficial breast tissue was  closed with interrupted sutures of 3-0 Vicryl.  Skin closed with running  subcuticular suture of 4-0 Monocryl and Steri-Strips.   Curved transverse incision was made in the left axilla just at the   hairline.  Dissection was carried down through the subcutaneous tissue.  The clavipectoral fascia was thickened, presumably because of the  multiple malignant lymph nodes and the chemotherapy.  We entered the  axilla.   We dissected from the lateral border of the pectoralis major muscles  cephalad.  We also identified the lateral border of pectoralis minor  muscle.  I did not really find any lymph nodes between the pectoralis  major and pectoralis minor.  I then dissected all of the level I and  level II lymph nodes.  This was a difficult dissection because multiple  nodes were clinically positive and there was a lot of desmoplastic  reaction.  I did identify the left axillary vein.  Some superficial  venous tributaries were controlled with multiple metal clips and  divided.  We had to slowly dissect the highest lymph nodes up and out  from the pectoral region and we brought them down with the majority of  the specimen.  We slowly dissected this away.  We preserved long  thoracic nerve and preserved the thoracodorsal neurovascular bundle.  The specimen was sent for routine histology.  Hemostasis was excellent  and achieved electrocautery and metal clips.  The wound was irrigated  with saline.  A 19-French Blake drain was placed the left axilla and  brought through separate stab incision inferolaterally.  This drain was  sutured to the skin with nylon suture and connected to a suction bulb.  Subcutaneous tissue was closed with interrupted sutures of 3-0 Vicryl.  The skin closed with running subcuticular suture of 4-0 Monocryl and  Steri-Strips.  Clean bandages were placed and the patient taken to  recovery room in stable condition.  Estimated blood loss was about 40  mL.  Complications none.  Sponge, needle and instrument counts were  correct.      Angelia Mould. Derrell Lolling, M.D.  Electronically Signed     HMI/MEDQ  D:  07/11/2007  T:  07/12/2007  Job:  [id number]   cc:   Ladona Horns. Mariel Sleet,  MD  Lazaro Arms, M.D.

## 2010-12-09 NOTE — Procedures (Signed)
NAMEVAISHALI, BAISE               ACCOUNT NO.:  [id number]   MEDICAL RECORD NO.:  [id number]          PATIENT TYPE:  INP   LOCATION:  A325                          FACILITY:  [facility]   PHYSICIAN:  Gerrit Friends. Dietrich Pates, MD, FACCDATE OF BIRTH:  Sep 08, 1960   DATE OF PROCEDURE:  03/14/2007  DATE OF DISCHARGE:                                ECHOCARDIOGRAM   REFERRING PHYSICIAN:  Dr. Rito Ehrlich and Dietrich Pates.   CLINICAL DATA:  A 50 year old woman with sepsis and hypertension.  M-  mode aorta 3.3, left atrium 3.4, septum 1.7, posterior wall 1.4, LV  diastole 3.5, LV systole 2.4.   1. Technically adequate echocardiographic study.  2. Normal left atrium, right atrium and right ventricle.  3. Normal aortic valve; normal diameter of the proximal ascending      aorta; mild calcification of the wall.  4. Very mild mitral valve thickening; mild annular calcification;      slight regurgitation.  5. Pulmonic valve and proximal pulmonary artery not ideally imaged; no      gross abnormalities detected.  6. Normal tricuspid valve; physiologic regurgitation; estimated RV      systolic pressure at the upper limit of normal to mildly increased.  7. Normal left ventricular size; mild to moderate hypertrophy with      disproportionate septal thickening; hyperdynamic regional and      global LV systolic function.  8. Physiologic pericardial effusion.      Gerrit Friends. Dietrich Pates, MD, [facility]  Electronically Signed     RMR/MEDQ  D:  03/14/2007  T:  03/15/2007  Job:  [id number]

## 2010-12-09 NOTE — Procedures (Signed)
Gina Solis, Gina Solis               ACCOUNT NO.:  [id number]   MEDICAL RECORD NO.:  [id number]          PATIENT TYPE:  AMB   LOCATION:  DAY                           FACILITY:  [facility]   PHYSICIAN:  Gerrit Friends. Dietrich Pates, MD, FACCDATE OF BIRTH:  18-May-1961   DATE OF PROCEDURE:  03/18/2007  DATE OF DISCHARGE:  03/17/2007                                ECHOCARDIOGRAM   CLINICAL DATA:  50 year old woman with carcinoma of the breast and a  recent admission for sepsis; evaluate for endocarditis.   1. Technically adequate transesophageal echocardiographic study.  2. Normal left atrium, right atrium and right ventricle; normal left      atrial appendage.  3. Normal proximal ascending aorta without evidence for      atherosclerotic disease.  4. Catheter visualized with its tip extending into the right atrium.  5. Normal aortic, mitral, tricuspid and pulmonic valves; normal      proximal pulmonary artery; physiologic mitral regurgitation.  6. Normal left ventricular size; mild hypertrophy; normal regional and      global function.  7. No evidence for endocarditis.      Gerrit Friends. Dietrich Pates, MD, [facility]  Electronically Signed     RMR/MEDQ  D:  03/18/2007  T:  03/19/2007  Job:  [id number]

## 2010-12-09 NOTE — Group Therapy Note (Signed)
Gina Solis, Gina Solis               ACCOUNT NO.:  [id number]   MEDICAL RECORD NO.:  [id number]          PATIENT TYPE:  INP   LOCATION:  A325                          FACILITY:  [facility]   PHYSICIAN:  Skeet Latch, DO    DATE OF BIRTH:  Oct 10, 1960   DATE OF PROCEDURE:  DATE OF DISCHARGE:                                 PROGRESS NOTE   SUBJECTIVE:  Ms. Brugger is doing much better today. Patient states that  the achiness in her joints have improved. The patient states that the  swelling in her face has slightly improved but she is still swollen in  her face. Patient is more ambulatory today, was sitting up on the phone  on my exam. Patient denies any chest pain, lightheadedness, or  dizziness, and states overall she feels a little bit better today, and  she is in no acute distress.   OBJECTIVE:  Temperature is 98.9, pulse 61, respirations 20, blood  pressure 123/72, patient sating 99% on room air.   CARDIOVASCULAR:  Regular rate and rhythm. No murmurs, rubs, or gallops.  LUNGS: Clear to auscultation bilaterally. No rales, rhonchi, or  wheezing.  ABDOMEN: Soft, nontender, nondistended. No organomegaly. No rigidity or  guarding. Positive bowel sounds.  EXTREMITIES: She has some trace edema on bilateral lower extremities.  No clubbing, or cyanosis.  NEUROLOGIC: She is alert and oriented x3. Pleasant and cooperative.  Cranial nerves II-XII grossly intact.   LABORATORIES:  Total creatinine kinase 3,872, CK-MB 19, troponin 0.06,  first troponin was 0.07, ESR 62. Blood cultures are pending, phosphorous  3.4, magnesium 1.9, sodium 136, potassium 3.8, chloride 101, CO2 28,  glucose 121, BUN 8, creatinine 1.00, white count 9.7, hemoglobin 11.2,  hematocrit 33.6, platelets 204, PTT is 45, PT 15.6, INR is 1.2.  Urinalysis, essentially normal. Portable chest, no significant findings.   ASSESSMENT/PLAN:  1. Breast cancer, oncology has been consulted, waiting their      recommendations at this  time.  2. Port-a-cath inspection, this has been discontinued and patient has      a PIC line in place at this time. Patient continues to be on IV      vancomycin, IVZosyn, awaiting blood cultures.  3. ALLERGIC REACTIONS/RASH, patient is on Benadryl and IV steroids. We      will get a dermatology consult with Dr. Margo Aye, he has seen patient      in his office 2 to 3 times.  4. Possible rhabdomyolysis, patient's total creatinine kinase is      elevated, has improved today. I will increase patient's IV      hydration and we will follow her total Cks daily and hopefully with      aggressive fluid management      this will improve. This may have caused her achy joints at this      time. Also have ordered an ultrasound on her abdomen for elevated      LFTs. This is pending at this time. Patient will be continued on      DVT as well as GI prophylaxis at this time.  Skeet Latch, DO  Electronically Signed     SM/MEDQ  D:  03/09/2007  T:  03/10/2007  Job:  [id number]

## 2010-12-09 NOTE — H&P (Signed)
Gina Solis, MEINERS               ACCOUNT NO.:  [id number]   MEDICAL RECORD NO.:  [id number]          PATIENT TYPE:  INP   LOCATION:  A325                          FACILITY:  [facility]   PHYSICIAN:  Skeet Latch, DO    DATE OF BIRTH:  1961/01/02   DATE OF ADMISSION:  03/08/2007  DATE OF DISCHARGE:  LH                              HISTORY & PHYSICAL   PRIMARY CARE PHYSICIAN:  Franchot Heidelberg, M.D.   CHIEF COMPLAINT:  Joint pain.   HISTORY OF PRESENT ILLNESS:  This is a 50 year old African American  female who presents from her primary care physician's office with a  complaint of joint pain and drainage from her Port-A-Cath.  Apparently  the patient was diagnosed with breast cancer approximately 1-2 months  prior.  The patient had a breast biopsy done prior to the diagnosis and  developed a systemic allergic reaction.  The patient has been battling a  rash and swelling from unknown cause and was sent to dermatology, who  performed two biopsies on her rash and this was treated with steroids.  The patient's symptoms did not improve, was sent back to her primary  care physician's office, who spoke with a dermatologist at [facility], who advised high-dose oral steroids for approximately 2  weeks and helpful agents.  The patient seemed to be doing better with  her rash with this treatment and her prednisone dose was halved to about  20 mg daily.  The patient states the rash has improved but her itching  has not.  The patient began getting joint pain approximately 4-5 days  ago and having a sore throat.  She also began to have a fever, stuffy  nose, but no cough.  The patient denies any chest pain, nausea, vomiting  or diarrhea.  I spoke with Dr. Fonnie Jarvis regarding this patient and he  felt the patient needed be admitted secondary to her drainage from her  Port-A-Cath and joint pain, her elevated temperature and her history of  breast cancer.  At this time the patient was  seen by surgery upon  admission and taken directly to the OR and had her Port-A-Cath changed.  When I saw the patient, the patient just returned from having her Port-A-  Cath changed.  The patient is complaining of chills at this time.  The  patient was started on Levaquin p.o. post Port-A-Cath removal.   PAST MEDICAL HISTORY:  1. Recent breast cancer diagnosis.  2. Gastroesophageal reflux disease.  3. Hyperlipidemia.  4. Hypertension.  5. Some arthritis pain.   PAST SURGICAL HISTORY:  1. Left breast biopsy prior to breast cancer in June 2008.  2. Partial hysterectomy in 1989 for uterine fibroids.  3. Port-A-Cath placement for chemo, right anterior chest.   FAMILY HISTORY:  Positive for hypertension, hyperlipidemia, kidney  failure, diabetes, arthritis.   SOCIAL HISTORY:  The patient is divorced.  States she smokes 3-4  cigarettes a week for approximately 1 year.  Is a social drinker.  Denies any illicit drug use.   REVIEW OF SYSTEMS:  Please look at the HPI.  PHYSICAL EXAM:  VITAL SIGNS:  Temperature is 100, pulse 120,  respirations 20, blood pressure is 124/71.  The patient is saturating  99% on room air.  GENERAL:  The patient looks well-developed, well-nourished, no acute  distress.  She has a swollen face on my exam.  She does not appear in  acute distress.  She is complaining of chills.  HEENT:  Head is atraumatic, normocephalic.  Swollen face as stated  previous.  PERRL.  EOMI.  NECK:  Soft, supple, nontender, nondistended.  No JVD, thyromegaly is  appreciated.  LUNGS:  Clear to auscultation bilaterally.  No rales, rhonchi or  wheezing.  HEART:  She is tachycardiac, S1, S2, no gallops or murmurs appreciated.  ABDOMEN:  Soft, nontender, nondistended.  Positive bowel sounds.  EXTREMITIES:  No clubbing, cyanosis or edema was appreciated.  SKIN:  Some resolving skin rash is noted h.s. well.  CHEST WALL:  She does have a Port-A-Cath in place, right anterior chest  wall.   It is bandaged at this time but no obvious bleeding or drainage  is noted.   LABS TAKEN FROM HER PRIMARY CARE PHYSICIAN'S OFFICE:  Urinalysis shows  small amount of bilirubin, a large amount of blood, nitrite negative,  leukocyte esterase negative.  Rapid strep was negative.  White count  14.3, hemoglobin 12.2, hematocrit 36.1, platelets 261.  Sodium 131,  potassium 3.6, chloride 97, CO2 26, glucose 139, BUN 12, creatinine  1.09, alkaline phosphatase 80, AST 102, ALT 72, total protein 6.3,  albumin 2.7, calcium 8.3.   ASSESSMENT:  This is a 50 year old African American female who presents  from her primary care physician's office with drainage from her Port-A-  Cath, joint pain, and recent diagnosis of breast cancer.  The patient  had her Port-A-Cath replaced upon arrival to her room.  The patient is  stable at this time, complaining of chills and still having some joint  pain.   PLAN:  1. For Port-A-Cath infection, the patient had this replaced by      surgery.  Will start IV vancomycin and Zosyn.  Will obtain blood      cultures at this time.  2. For her breast cancer we will get an oncology consult regarding any      suggestions from oncology.  3. For allergic reaction and rash, start the patient on Benadryl as      well as IV steroids at this time.  4. Her hyperlipidemia seems to be stable.  Continue her current      medications.  5. For elevated liver enzymes, will get an ultrasound of her abdomen,      rule out any gallstones or hepatic disease, and will hold her      simvastatin at this time.  6. The patient will be placed on DVT as well as GI prophylaxis, and      the patient will be followed carefully.      Skeet Latch, DO  Electronically Signed     SM/MEDQ  D:  03/08/2007  T:  03/09/2007  Job:  [id number]

## 2010-12-09 NOTE — Group Therapy Note (Signed)
Gina Solis, Gina Solis               ACCOUNT NO.:  [id number]   MEDICAL RECORD NO.:  [id number]          PATIENT TYPE:  INP   LOCATION:  A325                          FACILITY:  [facility]   PHYSICIAN:  Skeet Latch, DO    DATE OF BIRTH:  06-14-1961   DATE OF PROCEDURE:  03/10/2007  DATE OF DISCHARGE:                                 PROGRESS NOTE   SUBJECTIVE:  Ms. Eastham seems to be improving today.  She seems to have  decreased swelling in her face.  The patient also states the achiness in  her joints is improving.  The patient still has some achiness on her  right side and in her hands, but it is greatly improved.  The patient  denies any chest pain, abdominal pains or urinary problems at this time,  and continues to improve.  The patient was comfortable in no acute  distress currently.   OBJECTIVE:  VITAL SIGNS:  Temperature 97.4, pulse 106, respirations 20,  blood pressure 153/82, satting 96%.  CARDIOVASCULAR:  Tachycardic, no rubs, gallops or murmurs.  LUNGS:  Clear to auscultation bilaterally, no rales, rhonchi or  wheezing.  ABDOMEN:  Soft, nontender, nondistended.  No rigidity, masses, positive  bowel sounds.  EXTREMITIES:  She had some trace edema, no clubbing, cyanosis.   LABORATORY DATA:  Hepatitis panel was negative.  A wound culture did  show some moderate staph aureus.  Total creatinine kinase was  3667.  CK-MB 19.1.  Sodium 135, potassium 3.9, chloride 102, CO2 25,  glucose 137, BUN 8, creatinine 0.87.  White count 10.1, hemoglobin 10.2,  hematocrit 30.2, platelets 225.  So far blood cultures are negative.   Abdominal ultrasound showed:   Heterogenous echogenicity of the liver but no focal hepatic lesions or  intrahepatic duct dilatation; otherwise, normal.  Chest x-ray negative.   ASSESSMENT/PLAN:  1. Breast cancer.  Will await oncology recommendations.  The patient      is currently stable and will receive probably chemotherapy as an      outpatient.  2. Allergic  reaction.  The patient continued to be on Benadryl and IV      steroids.  Seems to be improving her facial swelling.  3. Positive blood cultures.  The patient will continue to be on IV      antibiotics at this time and will follow her white count as well as      her temperature.  4. Continue on deep venous thrombosis as well as gastroesophageal      prophylaxis.     Skeet Latch, DO  Electronically Signed    SM/MEDQ  D:  03/10/2007  T:  03/11/2007  Job:  [id number]

## 2011-03-23 ENCOUNTER — Ambulatory Visit (HOSPITAL_COMMUNITY): Payer: Medicare Other | Admitting: Oncology

## 2011-04-23 ENCOUNTER — Ambulatory Visit (INDEPENDENT_AMBULATORY_CARE_PROVIDER_SITE_OTHER): Payer: Medicare Other | Admitting: Otolaryngology

## 2011-04-23 LAB — DIFFERENTIAL
Lymphocytes Relative: 29
Lymphs Abs: 1.6
Monocytes Absolute: 0.3
Monocytes Relative: 6
Neutro Abs: 3.3

## 2011-04-23 LAB — CBC
HCT: 36.7
Platelets: 235
RDW: 14.9

## 2011-04-23 LAB — COMPREHENSIVE METABOLIC PANEL
Albumin: 3.7
Alkaline Phosphatase: 91
BUN: 12
Creatinine, Ser: 0.97
Potassium: 3.8
Total Protein: 7.3

## 2011-04-29 ENCOUNTER — Encounter (HOSPITAL_COMMUNITY): Payer: Medicare Other | Admitting: Oncology

## 2011-05-04 LAB — DIFFERENTIAL
Basophils Relative: 1
Lymphocytes Relative: 29
Monocytes Absolute: 0.6
Monocytes Relative: 11
Neutro Abs: 3.3
Neutrophils Relative %: 57

## 2011-05-04 LAB — URINALYSIS, ROUTINE W REFLEX MICROSCOPIC
Bilirubin Urine: NEGATIVE
Hgb urine dipstick: NEGATIVE
Nitrite: NEGATIVE
Specific Gravity, Urine: 1.015
Urobilinogen, UA: 0.2
pH: 7.5

## 2011-05-04 LAB — CBC
HCT: 31.8 — ABNORMAL LOW
Hemoglobin: 10.7 — ABNORMAL LOW
MCHC: 33.7
MCV: 88
RDW: 16 — ABNORMAL HIGH

## 2011-05-04 LAB — COMPREHENSIVE METABOLIC PANEL
BUN: 5 — ABNORMAL LOW
Calcium: 9.8
Glucose, Bld: 91
Total Protein: 6.8

## 2011-05-05 LAB — DIFFERENTIAL
Eosinophils Relative: 0
Lymphocytes Relative: 20
Monocytes Absolute: 0.5
Monocytes Relative: 10
Neutro Abs: 3.6

## 2011-05-05 LAB — BASIC METABOLIC PANEL
CO2: 27
Calcium: 9.3
GFR calc Af Amer: >60
GFR calc non Af Amer: >60
Potassium: 3.4 — ABNORMAL LOW
Sodium: 139

## 2011-05-05 LAB — CBC
HCT: 31.5 — ABNORMAL LOW
Hemoglobin: 10.5 — ABNORMAL LOW
RBC: 3.48 — ABNORMAL LOW

## 2011-05-06 LAB — DIFFERENTIAL
Basophils Absolute: 0
Basophils Relative: 0
Basophils Relative: 1
Eosinophils Absolute: 0
Eosinophils Relative: 0
Lymphs Abs: 1.2
Monocytes Relative: 8
Neutro Abs: 4.2
Neutrophils Relative %: 72

## 2011-05-06 LAB — CBC
HCT: 31.5 — ABNORMAL LOW
MCHC: 33.1
MCHC: 33.8
MCV: 89.9
Platelets: 295
RBC: 3.33 — ABNORMAL LOW
RDW: 17.6 — ABNORMAL HIGH
RDW: 18 — ABNORMAL HIGH
WBC: 8.9

## 2011-05-06 LAB — BASIC METABOLIC PANEL
BUN: 6
CO2: 25
CO2: 26
Calcium: 9.1
Chloride: 106
Creatinine, Ser: 0.86
GFR calc Af Amer: >60
Glucose, Bld: 115 — ABNORMAL HIGH
Potassium: 3 — ABNORMAL LOW

## 2011-05-07 LAB — DIFFERENTIAL
Eosinophils Absolute: 0
Lymphocytes Relative: 14
Lymphs Abs: 1
Monocytes Relative: 10
Neutro Abs: 5.4
Neutrophils Relative %: 75

## 2011-05-07 LAB — BASIC METABOLIC PANEL
CO2: 28
Calcium: 8.8
Chloride: 102
GFR calc non Af Amer: >60
Glucose, Bld: 109 — ABNORMAL HIGH
Potassium: 3 — ABNORMAL LOW
Sodium: 140

## 2011-05-07 LAB — CBC
MCV: 88.3
Platelets: 288
RBC: 3.37 — ABNORMAL LOW
WBC: 7.2

## 2011-05-07 LAB — KOH PREP

## 2011-05-08 ENCOUNTER — Ambulatory Visit (HOSPITAL_COMMUNITY): Payer: Medicare Other | Admitting: Oncology

## 2011-05-08 LAB — CULTURE, BLOOD (ROUTINE X 2)
Culture: NO GROWTH
Culture: NO GROWTH

## 2011-05-08 LAB — DIFFERENTIAL
Basophils Absolute: 0
Basophils Absolute: 0
Basophils Absolute: 0.1
Basophils Relative: 0
Basophils Relative: 0
Basophils Relative: 1
Eosinophils Absolute: 0
Eosinophils Absolute: 0
Eosinophils Absolute: 0
Eosinophils Relative: 0
Eosinophils Relative: 0
Eosinophils Relative: 0
Eosinophils Relative: 0
Eosinophils Relative: 1
Lymphocytes Relative: 17
Lymphocytes Relative: 12
Lymphocytes Relative: 14
Lymphocytes Relative: 14
Lymphocytes Relative: 9 — ABNORMAL LOW
Lymphs Abs: 1
Lymphs Abs: 1.1
Lymphs Abs: 1.5
Lymphs Abs: 1.5
Monocytes Absolute: 1.1 — ABNORMAL HIGH
Monocytes Absolute: 0.1 — ABNORMAL LOW
Monocytes Absolute: 0.3
Monocytes Absolute: 0.4
Monocytes Absolute: 0.6
Monocytes Relative: 11
Monocytes Relative: 1 — ABNORMAL LOW
Monocytes Relative: 2 — ABNORMAL LOW
Monocytes Relative: 4
Monocytes Relative: 4
Monocytes Relative: 5
Neutro Abs: 6.9
Neutro Abs: 12.5 — ABNORMAL HIGH
Neutro Abs: 12.7 — ABNORMAL HIGH
Neutro Abs: 9.7 — ABNORMAL HIGH
Neutrophils Relative %: 81 — ABNORMAL HIGH
Neutrophils Relative %: 91 — ABNORMAL HIGH

## 2011-05-08 LAB — CBC
HCT: 32.1 — ABNORMAL LOW
HCT: 29.2 — ABNORMAL LOW
HCT: 30 — ABNORMAL LOW
HCT: 31.8 — ABNORMAL LOW
HCT: 33.3 — ABNORMAL LOW
Hemoglobin: 10.6 — ABNORMAL LOW
Hemoglobin: 10.1 — ABNORMAL LOW
Hemoglobin: 10.8 — ABNORMAL LOW
Hemoglobin: 9.8 — ABNORMAL LOW
MCHC: 33.2
MCHC: 33.5
MCHC: 33.5
MCHC: 33.6
MCHC: 33.7
MCV: 86.4
MCV: 86.7
MCV: 86.9
MCV: 87.2
Platelets: 249
Platelets: 269
Platelets: 272
Platelets: 327
RBC: 3.63 — ABNORMAL LOW
RBC: 3.27 — ABNORMAL LOW
RBC: 3.46 — ABNORMAL LOW
RDW: 14.6 — ABNORMAL HIGH
RDW: 14.6 — ABNORMAL HIGH
WBC: 11.1 — ABNORMAL HIGH
WBC: 13.8 — ABNORMAL HIGH
WBC: 15.7 — ABNORMAL HIGH

## 2011-05-08 LAB — COMPREHENSIVE METABOLIC PANEL
ALT: 63 — ABNORMAL HIGH
AST: 81 — ABNORMAL HIGH
Albumin: 2.3 — ABNORMAL LOW
Albumin: 2.4 — ABNORMAL LOW
Alkaline Phosphatase: 52
Alkaline Phosphatase: 61
BUN: 10
CO2: 28
Calcium: 8.4
Chloride: 103
Creatinine, Ser: 0.97
GFR calc Af Amer: >60
Glucose, Bld: 201 — ABNORMAL HIGH
Potassium: 4
Total Bilirubin: 0.5
Total Protein: 5.3 — ABNORMAL LOW

## 2011-05-08 LAB — CK TOTAL AND CKMB (NOT AT ARMC)
CK, MB: 12.8 — ABNORMAL HIGH
CK, MB: 5.9 — ABNORMAL HIGH
Total CK: 1822 — ABNORMAL HIGH
Total CK: 346 — ABNORMAL HIGH
Total CK: 387 — ABNORMAL HIGH
Total CK: 719 — ABNORMAL HIGH

## 2011-05-08 LAB — BASIC METABOLIC PANEL
CO2: 28
Calcium: 8.9
Chloride: 102
GFR calc Af Amer: >60
GFR calc non Af Amer: >60
GFR calc non Af Amer: >60
Glucose, Bld: 140 — ABNORMAL HIGH
Potassium: 3.3 — ABNORMAL LOW
Potassium: 3.6
Sodium: 136
Sodium: 137

## 2011-05-08 LAB — TSH: TSH: 0.891

## 2011-05-11 LAB — URINALYSIS, ROUTINE W REFLEX MICROSCOPIC
Bilirubin Urine: NEGATIVE
Glucose, UA: NEGATIVE
Ketones, ur: NEGATIVE
Leukocytes, UA: NEGATIVE
pH: 6

## 2011-05-11 LAB — CULTURE, BLOOD (ROUTINE X 2)

## 2011-05-11 LAB — PROTIME-INR
INR: 1.2
Prothrombin Time: 15.6 — ABNORMAL HIGH

## 2011-05-11 LAB — WOUND CULTURE

## 2011-05-11 LAB — BASIC METABOLIC PANEL
Chloride: 101
Chloride: 102
Chloride: 102
Creatinine, Ser: 1
GFR calc Af Amer: >60
GFR calc Af Amer: >60
GFR calc non Af Amer: >60
Glucose, Bld: 137 — ABNORMAL HIGH
Potassium: 3.8
Potassium: 3.9
Potassium: 3.8
Sodium: 135
Sodium: 138

## 2011-05-11 LAB — VANCOMYCIN, TROUGH: Vancomycin Tr: 11.4

## 2011-05-11 LAB — CARDIAC PANEL(CRET KIN+CKTOT+MB+TROPI)
CK, MB: 19 — ABNORMAL HIGH
CK, MB: 22.6 — ABNORMAL HIGH
Total CK: 4053 — ABNORMAL HIGH
Troponin I: 0.06
Troponin I: 0.07 — ABNORMAL HIGH

## 2011-05-11 LAB — CBC
HCT: 30.2 — ABNORMAL LOW
HCT: 37.3
Hemoglobin: 10.2 — ABNORMAL LOW
Hemoglobin: 12.6
MCHC: 33.4
MCV: 86.3
MCV: 86.5
MCV: 85.8
Platelets: 204
RBC: 4.34
RDW: 14.1 — ABNORMAL HIGH
RDW: 14.8 — ABNORMAL HIGH
WBC: 10.1
WBC: 9.7
WBC: 8.9

## 2011-05-11 LAB — DIFFERENTIAL
Basophils Relative: 0
Eosinophils Absolute: 0
Eosinophils Absolute: 0.2
Eosinophils Relative: 0
Eosinophils Relative: 2
Lymphocytes Relative: 10 — ABNORMAL LOW
Lymphs Abs: 0.8
Lymphs Abs: 1
Lymphs Abs: 1.9
Monocytes Absolute: 0.5
Monocytes Relative: 5
Monocytes Relative: 7
Neutrophils Relative %: 86 — ABNORMAL HIGH
Neutrophils Relative %: 87 — ABNORMAL HIGH
Neutrophils Relative %: 70

## 2011-05-11 LAB — TSH: TSH: 1.13

## 2011-05-11 LAB — CK TOTAL AND CKMB (NOT AT ARMC): Relative Index: 0.5

## 2011-05-11 LAB — LIPID PANEL
Cholesterol: 199
HDL: 37 — ABNORMAL LOW

## 2011-05-11 LAB — URINE CULTURE: Special Requests: NEGATIVE

## 2011-05-11 LAB — APTT: aPTT: 45 — ABNORMAL HIGH

## 2011-05-11 LAB — URINE MICROSCOPIC-ADD ON

## 2011-05-11 LAB — HEPATITIS PANEL, ACUTE
Hep A IgM: NEGATIVE
Hep B C IgM: NEGATIVE

## 2011-05-11 LAB — PHOSPHORUS: Phosphorus: 3.4

## 2011-05-12 LAB — CBC
HCT: 38.2
MCV: 85.3
Platelets: 302
WBC: 7.8

## 2011-05-12 LAB — COMPREHENSIVE METABOLIC PANEL
Albumin: 3.5
BUN: 9
Chloride: 107
Creatinine, Ser: 0.95
GFR calc non Af Amer: >60
Total Bilirubin: 0.5

## 2011-05-12 LAB — DIFFERENTIAL
Eosinophils Relative: 2
Lymphocytes Relative: 26
Lymphs Abs: 2.1
Monocytes Absolute: 0.3

## 2011-05-14 ENCOUNTER — Encounter (HOSPITAL_COMMUNITY): Payer: Medicare Other | Attending: Oncology | Admitting: Oncology

## 2011-05-14 ENCOUNTER — Encounter (HOSPITAL_COMMUNITY): Payer: Self-pay | Admitting: Oncology

## 2011-05-14 VITALS — BP 162/86 | HR 91 | Temp 98.4°F | Wt 256.0 lb

## 2011-05-14 DIAGNOSIS — E7800 Pure hypercholesterolemia, unspecified: Secondary | ICD-10-CM | POA: Insufficient documentation

## 2011-05-14 DIAGNOSIS — Z09 Encounter for follow-up examination after completed treatment for conditions other than malignant neoplasm: Secondary | ICD-10-CM | POA: Insufficient documentation

## 2011-05-14 DIAGNOSIS — Z853 Personal history of malignant neoplasm of breast: Secondary | ICD-10-CM

## 2011-05-14 DIAGNOSIS — L989 Disorder of the skin and subcutaneous tissue, unspecified: Secondary | ICD-10-CM | POA: Insufficient documentation

## 2011-05-14 DIAGNOSIS — Z171 Estrogen receptor negative status [ER-]: Secondary | ICD-10-CM

## 2011-05-14 DIAGNOSIS — Z79899 Other long term (current) drug therapy: Secondary | ICD-10-CM | POA: Insufficient documentation

## 2011-05-14 DIAGNOSIS — C50419 Malignant neoplasm of upper-outer quadrant of unspecified female breast: Secondary | ICD-10-CM

## 2011-05-14 DIAGNOSIS — I10 Essential (primary) hypertension: Secondary | ICD-10-CM | POA: Insufficient documentation

## 2011-05-14 LAB — COMPREHENSIVE METABOLIC PANEL
Albumin: 3.7 g/dL (ref 3.5–5.2)
BUN: 12 mg/dL (ref 6–23)
Chloride: 102 mEq/L (ref 96–112)
Creatinine, Ser: 0.96 mg/dL (ref 0.50–1.10)
GFR calc Af Amer: 79 mL/min — ABNORMAL LOW (ref >90)
GFR calc non Af Amer: 68 mL/min — ABNORMAL LOW (ref >90)
Glucose, Bld: 90 mg/dL (ref 70–99)
Total Bilirubin: 0.3 mg/dL (ref 0.3–1.2)

## 2011-05-14 LAB — CBC
HCT: 41.5 % (ref 36.0–46.0)
MCV: 86.1 fL (ref 78.0–100.0)
RDW: 14 % (ref 11.5–15.5)
WBC: 5.8 10*3/uL (ref 4.0–10.5)

## 2011-05-14 LAB — DIFFERENTIAL
Basophils Absolute: 0 10*3/uL (ref 0.0–0.1)
Basophils Relative: 0 % (ref 0–1)
Neutro Abs: 3.2 10*3/uL (ref 1.7–7.7)
Neutrophils Relative %: 54 % (ref 43–77)

## 2011-05-14 NOTE — Progress Notes (Signed)
Avon Gully, MD [address] [place] [address]  1. BREAST CANCER, HX OF  CBC, Differential, Comprehensive metabolic panel, CBC, CBC, Comprehensive metabolic panel, Comprehensive metabolic panel, Differential, Differential    CURRENT THERAPY: S/P  preoperative dose dense FEC x 6 cycles followed by surgery and then radiation.  INTERVAL HISTORY: Gina Solis 50 y.o. female returns for  regular  visit for followup of Stage IIIB (T4b N2) breast cancer, triple negative, high grade, infiltrating ductal type.  1.2 cm in size at time of resection with 2/15 nodes positive for disease.  LVI identified.   Patient is here today for followup. In the past the patient had breast implant which had to be removed due to infection. Patient reports that she is going to try to have reimplantation for her breast this coming winter or beginning of next year.  Patient denies any complaints. She does mention few spots on her skin which she would like me to evaluate. She reports that these are being followed by primary care physician, namely Dr. Felecia Shelling. She reports that he gave her a cream which has improved the spot significantly.  There is one spot on her right hip that has been there since an episode of cellulitis. She denies any pain, itching, draining, or discomfort associated with these lesions.  Oncologically the patient denies any complaints.  Past Medical History  Diagnosis Date  . Breast cancer   . Dermatomyositis   . Hypertension   . Hypercholesteremia   . Arthritis   . GERD (gastroesophageal reflux disease)     has THYROID NODULE; HYPERLIPIDEMIA; MORBID OBESITY; ANXIETY DEPRESSION; HYPERTENSION; LYMPHEDEMA, LEFT ARM; ALLERGIC RHINITIS; GERD; CYSTITIS, ACUTE; SYMPTOMATIC MENOPAUSAL/FEMALE CLIMACTERIC STATES; DERMATOMYOSITIS; and BREAST CANCER, HX OF on her problem list.      has no known allergies.  Ms. Yanik does not currently have medications on file.  Past Surgical History    Procedure Date  . Abdominal hysterectomy     only removed uterus  . Tubal ligation 1985  . Mastectomy, partial left  . Breast reconstruction left    Denies any headaches, dizziness, double vision, fevers, chills, night sweats, nausea, vomiting, diarrhea, constipation, chest pain, heart palpitations, shortness of breath, blood in stool, black tarry stool, urinary pain, urinary burning, urinary frequency, hematuria.   PHYSICAL EXAMINATION  ECOG PERFORMANCE STATUS: 0 - Asymptomatic  Filed Vitals:   05/14/11 1024  BP: 162/86  Pulse: 91  Temp: 98.4 F (36.9 C)    GENERAL:alert, no distress, well nourished, well developed, comfortable, cooperative, obese and smiling SKIN: skin color, texture, turgor are normal. On the patient's right lateral hip, there is a 2.5 x 1.5 cm hyperpigmented (brown) MOLE.  On her left lateral thigh the patient has 4 slightly erythematous/hyperpigmentation lesions. The most superior one measures 0.75 cm. The most inferior one measures 1 cm. She is two posterior lesions measuring a half a centimeter in size. All the lesions located on her left lateral thigh have an area of hypopigmentation surrounding them. These are the lesions that she placed a lotion on pills provided by her primary care physician. Please see the patient's paper chart for a drawing of these lesions. HEAD: Normocephalic EYES: normal EARS: External ears normal OROPHARYNX:mucous membranes are moist  NECK: supple, no adenopathy, no bruits, no JVD, thyroid normal size, non-tender, without nodularity, no stridor, non-tender, trachea midline LYMPH:  no palpable lymphadenopathy, no hepatosplenomegaly BREAST:right breast normal without mass, skin or nipple changes or axillary nodes, left post-mastectomy site well healed and  free of suspicious changes LUNGS: clear to auscultation and percussion HEART: regular rate & rhythm, no murmurs, no gallops, S1 normal and S2 normal ABDOMEN:abdomen soft,  non-tender, obese and normal bowel sounds BACK: Back symmetric, no curvature., No CVA tenderness EXTREMITIES:less then 2 second capillary refill, no joint deformities, effusion, or inflammation, no edema, no skin discoloration, no clubbing, no cyanosis  NEURO: alert & oriented x 3 with fluent speech, no focal motor/sensory deficits, gait normal   LABORATORY DATA: CBC    Component Value Date/Time   WBC 5.8 05/14/2011 1128   RBC 4.82 05/14/2011 1128   HGB 13.3 05/14/2011 1128   HCT 41.5 05/14/2011 1128   PLT 263 05/14/2011 1128   MCV 86.1 05/14/2011 1128   MCH 27.6 05/14/2011 1128   MCHC 32.0 05/14/2011 1128   RDW 14.0 05/14/2011 1128   LYMPHSABS 2.1 05/14/2011 1128   MONOABS 0.3 05/14/2011 1128   EOSABS 0.2 05/14/2011 1128   BASOSABS 0.0 05/14/2011 1128      Chemistry      Component Value Date/Time   NA 139 05/14/2011 1128   K 3.4* 05/14/2011 1128   CL 102 05/14/2011 1128   CO2 29 05/14/2011 1128   BUN 12 05/14/2011 1128   CREATININE 0.96 05/14/2011 1128      Component Value Date/Time   CALCIUM 9.6 05/14/2011 1128   ALKPHOS 87 05/14/2011 1128   AST 15 05/14/2011 1128   ALT 12 05/14/2011 1128   BILITOT 0.3 05/14/2011 1128      PATHOLOGY: 1. Left breast, upper outer quadrant, partial mastectomy-invasive ductal carcinoma associated with fibrosis and inflammation, grade 3. Margin not involved. Maximum tumor size 1.2 cm. LVI identified. ER negative, PR negative, Ki-67 marker since December 7 HER-2 negative. 2. Left axillary contents-metastatic carcinoma in 2 of 15 lymph nodes. 3. Left breast, reexcision of superior margin-benign fibrofatty breast tissue. No tumor identified.    ASSESSMENT:  1. infiltrating ductal carcinoma of the left breast, stage IIIB, triple negative, status post dose dense FEC x6 cycles followed by surgery, followed by radiation. 2. Skin lesions, will follow closely 3. 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 4. Obesity 5.  Cellulitis of left aren't chest wall area with her expander having been removed in December.   PLAN:  1. lab work today: CBC differential and complete metabolic panel 2. measured all skin lesions. A drawn picture with measurements is located in the patient's paper chart. 3. patient will return to clinic in 6 months time for followup. At that point time we will reevaluate at the patient's lesions and determine whether a dermatology consult is warranted.   All questions were answered. The patient knows to call the clinic with any problems, questions or concerns. We can certainly see the patient much sooner if necessary.  KEFALAS,THOMAS

## 2011-05-14 NOTE — Patient Instructions (Signed)
[facility]  Discharge Instructions  RECOMMENDATIONS MADE BY THE CONSULTANT AND ANY TEST RESULTS WILL BE SENT TO YOUR REFERRING DOCTOR.  We will keep a check/measurement on the darkened spots on your lt leg and rt hip.   If they begin to change more i.e., darker in pigment, start to hurt, etc., please let us know.  Today we drew labs for a CBC diff and CMET.  Please return to see [place] in 6 months to see Tom.   I acknowledge that I have been informed and understand all the instructions given to me and received a copy. I do not have any more questions at this time, but understand that I may call the Specialty Clinic at [facility] at [phone] during business hours should I have any further questions or need assistance in obtaining follow-up care.    __________________________________________  _____________  __________ Signature of Patient or Authorized Representative            Date                   Time    __________________________________________ Nurse's Signature

## 2011-09-01 ENCOUNTER — Other Ambulatory Visit (HOSPITAL_COMMUNITY): Payer: Self-pay | Admitting: Oncology

## 2011-09-01 DIAGNOSIS — Z9889 Other specified postprocedural states: Secondary | ICD-10-CM

## 2011-09-01 DIAGNOSIS — Z139 Encounter for screening, unspecified: Secondary | ICD-10-CM

## 2011-09-16 ENCOUNTER — Ambulatory Visit (HOSPITAL_COMMUNITY): Payer: Medicare Other

## 2011-09-17 ENCOUNTER — Encounter (HOSPITAL_COMMUNITY): Payer: Medicare Other

## 2011-09-23 ENCOUNTER — Ambulatory Visit (HOSPITAL_COMMUNITY)
Admission: RE | Admit: 2011-09-23 | Discharge: 2011-09-23 | Disposition: A | Discharge status: Home / Self Care | Payer: Medicare Other | Source: Ambulatory Visit | Attending: Oncology | Admitting: Oncology

## 2011-09-23 DIAGNOSIS — Z853 Personal history of malignant neoplasm of breast: Secondary | ICD-10-CM | POA: Insufficient documentation

## 2011-09-23 DIAGNOSIS — Z9889 Other specified postprocedural states: Secondary | ICD-10-CM

## 2011-09-30 ENCOUNTER — Ambulatory Visit (HOSPITAL_COMMUNITY): Payer: Medicare Other

## 2011-10-07 ENCOUNTER — Encounter (INDEPENDENT_AMBULATORY_CARE_PROVIDER_SITE_OTHER): Payer: Self-pay | Admitting: *Deleted

## 2011-11-02 ENCOUNTER — Telehealth: Payer: Self-pay

## 2011-11-02 NOTE — Telephone Encounter (Signed)
Referral came in from Dr. Felecia Shelling also.

## 2011-11-02 NOTE — Telephone Encounter (Signed)
Pt left VM that she needs to set up appt for colonoscopy. i tried to return call, no answer. Could not leave a VM.

## 2011-11-05 NOTE — Telephone Encounter (Signed)
Is scheduled for OV with Tana Coast, PA at 10:00 AM on 11/09/2011.

## 2011-11-06 ENCOUNTER — Encounter: Payer: Self-pay | Admitting: Gastroenterology

## 2011-11-09 ENCOUNTER — Ambulatory Visit: Payer: Medicare Other | Admitting: Gastroenterology

## 2011-11-09 ENCOUNTER — Telehealth: Payer: Self-pay | Admitting: Gastroenterology

## 2011-11-09 NOTE — Telephone Encounter (Signed)
Pt was a no show

## 2011-11-09 NOTE — Telephone Encounter (Signed)
Please reschedule

## 2011-11-11 ENCOUNTER — Ambulatory Visit (HOSPITAL_COMMUNITY): Payer: Medicare Other | Admitting: Oncology

## 2011-11-11 NOTE — Telephone Encounter (Signed)
Pt is aware of OV on 4/26 at 1030 with LSL

## 2011-11-20 ENCOUNTER — Ambulatory Visit (INDEPENDENT_AMBULATORY_CARE_PROVIDER_SITE_OTHER): Payer: Medicare Other | Admitting: Gastroenterology

## 2011-11-20 ENCOUNTER — Encounter: Payer: Self-pay | Admitting: Gastroenterology

## 2011-11-20 VITALS — BP 153/87 | HR 103 | Temp 97.6°F | Ht 63.0 in | Wt 259.2 lb

## 2011-11-20 DIAGNOSIS — K219 Gastro-esophageal reflux disease without esophagitis: Secondary | ICD-10-CM | POA: Insufficient documentation

## 2011-11-20 DIAGNOSIS — Z8719 Personal history of other diseases of the digestive system: Secondary | ICD-10-CM

## 2011-11-20 DIAGNOSIS — J387 Other diseases of larynx: Secondary | ICD-10-CM

## 2011-11-20 DIAGNOSIS — K649 Unspecified hemorrhoids: Secondary | ICD-10-CM | POA: Insufficient documentation

## 2011-11-20 MED ORDER — PEG-KCL-NACL-NASULF-NA ASC-C 100 G PO SOLR: 1.0000 | Freq: Once | ORAL | Status: DC

## 2011-11-20 MED ORDER — OMEPRAZOLE 20 MG PO CPDR: 20.0000 mg | DELAYED_RELEASE_CAPSULE | Freq: Two times a day (BID) | ORAL | Status: DC

## 2011-11-20 NOTE — Assessment & Plan Note (Signed)
Typical heartburn well controlled. Antireflux measures discussed and handout provided.

## 2011-11-20 NOTE — Patient Instructions (Signed)
We have scheduled you for a colonoscopy. Please see separate instructions. Your hoarseness could be due to laryngopharyngeal reflux. Would recommend increasing the omeprazole to twice daily as discussed. Continue at least for 2 to 3 months. If no significant improvement at that time, would go back to once daily dosing. A prescription was sent directly to [facility] in [place].  Gastroesophageal Reflux Disease, Adult Gastroesophageal reflux disease (GERD) happens when acid from your stomach flows up into the esophagus. When acid comes in contact with the esophagus, the acid causes soreness (inflammation) in the esophagus. Over time, GERD may create small holes (ulcers) in the lining of the esophagus. CAUSES   Increased body weight. This puts pressure on the stomach, making acid rise from the stomach into the esophagus.   Smoking. This increases acid production in the stomach.   Drinking alcohol. This causes decreased pressure in the lower esophageal sphincter (valve or ring of muscle between the esophagus and stomach), allowing acid from the stomach into the esophagus.   Late evening meals and a full stomach. This increases pressure and acid production in the stomach.   A malformed lower esophageal sphincter.  Sometimes, no cause is found. SYMPTOMS   Burning pain in the lower part of the mid-chest behind the breastbone and in the mid-stomach area. This may occur twice a week or more often.   Trouble swallowing.   Sore throat.   Dry cough.   Asthma-like symptoms including chest tightness, shortness of breath, or wheezing.  DIAGNOSIS  Your caregiver may be able to diagnose GERD based on your symptoms. In some cases, X-rays and other tests may be done to check for complications or to check the condition of your stomach and esophagus. TREATMENT  Your caregiver may recommend over-the-counter or prescription medicines to help decrease acid production. Ask your caregiver before starting or adding  any new medicines.  HOME CARE INSTRUCTIONS   Change the factors that you can control. Ask your caregiver for guidance concerning weight loss, quitting smoking, and alcohol consumption.   Avoid foods and drinks that make your symptoms worse, such as:   Caffeine or alcoholic drinks.   Chocolate.   Peppermint or mint flavorings.   Garlic and onions.   Spicy foods.   Citrus fruits, such as oranges, lemons, or limes.   Tomato-based foods such as sauce, chili, salsa, and pizza.   Fried and fatty foods.   Avoid lying down for the 3 hours prior to your bedtime or prior to taking a nap.   Eat small, frequent meals instead of large meals.   Wear loose-fitting clothing. Do not wear anything tight around your waist that causes pressure on your stomach.   Raise the head of your bed 6 to 8 inches with wood blocks to help you sleep. Extra pillows will not help.   Only take over-the-counter or prescription medicines for pain, discomfort, or fever as directed by your caregiver.   Do not take aspirin, ibuprofen, or other nonsteroidal anti-inflammatory drugs (NSAIDs).  SEEK IMMEDIATE MEDICAL CARE IF:   You have pain in your arms, neck, jaw, teeth, or back.   Your pain increases or changes in intensity or duration.   You develop nausea, vomiting, or sweating (diaphoresis).   You develop shortness of breath, or you faint.   Your vomit is green, yellow, black, or looks like coffee grounds or blood.   Your stool is red, bloody, or black.  These symptoms could be signs of other problems, such as heart disease,  gastric bleeding, or esophageal bleeding. MAKE SURE YOU:   Understand these instructions.   Will watch your condition.   Will get help right away if you are not doing well or get worse.  Document Released: 04/22/2005 Document Revised: 07/02/2011 Document Reviewed: 01/30/2011 [facility] Patient Information 2012 [place], [place].

## 2011-11-20 NOTE — Progress Notes (Signed)
Primary Care Physician:  Avon Gully, MD, MD  Primary Gastroenterologist:  Jonette Eva, MD   Chief Complaint  Patient presents with  . Colonoscopy  . Hemorrhoids    HPI:  Gina Solis is a 51 y.o. female here to schedule her first ever colonoscopy. She has a history of stage IIIB breast cancer diagnosed in 2008. Overall she is doing well. Her course was complicated by infected Port-A-Cath at one point, failure of reconstructive surgery of the left breast.  Patient has difficulty having a bowel movement for several year. She states she gets an urge to have a bowel movement but when she has since not they will come out. Sometimes she has to manipulate with her hands. Stool is generally soft. However if she does have to strain she may note diarrhea blood per rectum. Sometimes her hemorrhoids flare up for couple of days at a time.No typical heartburn on omeprazole. Mouth very dry, chronic hoarseness for two years. Saw ENT. She mentions that they thought maybe she had hoarseness from her reflux. Has not been on PPI BID.    She has 2 second degree relatives with colon cancer at an advanced age.  Current Outpatient Prescriptions  Medication Sig Dispense Refill  . amLODipine (NORVASC) 5 MG tablet Take 5 mg by mouth daily.      . cyclobenzaprine (FLEXERIL) 10 MG tablet Take 10 mg by mouth 3 (three) times daily as needed.      Marland Kitchen omeprazole (PRILOSEC) 20 MG capsule Take 1 capsule (20 mg total) by mouth 2 (two) times daily with a meal.  60 capsule  3  . DISCONTD: omeprazole (PRILOSEC) 20 MG capsule Take 20 mg by mouth daily.        . meclizine (ANTIVERT) 12.5 MG tablet Take 12.5 mg by mouth 3 (three) times daily as needed. For dizziness         Allergies as of 11/20/2011 - Review Complete 11/20/2011  Allergen Reaction Noted  . Codeine Nausea Only 11/20/2011    Past Medical History  Diagnosis Date  . Breast cancer 2008    Stage IIIB  . Dermatomyositis   . Hypertension   .  Hypercholesteremia   . Arthritis   . GERD (gastroesophageal reflux disease)   . Lymphedema of arm     left  . Multiple thyroid nodules     Past Surgical History  Procedure Date  . Abdominal hysterectomy     only removed uterus  . Tubal ligation 1985  . Mastectomy, partial 2008    left  . Breast reconstruction left    left  . Esophagogastroduodenoscopy 05/02/2007    Small hiatal hernia, otherwise normal stomach./Normal duodenal bulb and second portion of the duodenum/White plaques seen from the proximal to the distal esophagus/ Shallow ulcerations with mild erythema seen scattered throughout the esophagus most pronounced in the distal esophagus.  HSV1    Family History  Problem Relation Age of Onset  . Breast cancer Paternal Aunt   . Colon cancer Maternal Uncle     age 75s  . Kidney failure Mother   . Heart failure Mother   . Colon cancer Paternal Grandfather     ???    History   Social History  . Marital Status: Divorced    Spouse Name: N/A    Number of Children: 3  . Years of Education: N/A   Occupational History  . disabled    Social History Main Topics  . Smoking status: Former Smoker  Types: Cigarettes  . Smokeless tobacco: Not on file  . Alcohol Use: No  . Drug Use: No  . Sexually Active:    Other Topics Concern  . Not on file   Social History Narrative  . No narrative on file      ROS:  General: Negative for anorexia, weight loss, fever, chills, fatigue, weakness. Eyes: Negative for vision changes.  ENT: Negative for hoarseness, difficulty swallowing , nasal congestion. CV: Negative for chest pain, angina, palpitations, dyspnea on exertion, peripheral edema.  Respiratory: Negative for dyspnea at rest, dyspnea on exertion, cough, sputum, wheezing.  GI: See history of present illness. GU:  Negative for dysuria, hematuria, urinary incontinence, urinary frequency, nocturnal urination.  MS: Negative for joint pain, low back pain. Intermittent  left arm lymphedema Derm: Negative for rash or itching.  Neuro: Negative for weakness, abnormal sensation, seizure, frequent headaches, memory loss, confusion.  Psych: Negative for anxiety, depression, suicidal ideation, hallucinations.  Endo: Negative for unusual weight change.  Heme: Negative for bruising or bleeding. Allergy: Negative for rash or hives.    Physical Examination:  BP 153/87  Pulse 103  Temp(Src) 97.6 F (36.4 C) (Temporal)  Ht 5\' 3"  (1.6 m)  Wt 259 lb 3.2 oz (117.572 kg)  BMI 45.92 kg/m2   General: Well-nourished, well-developed in no acute distress. obese Head: Normocephalic, atraumatic.   Eyes: Conjunctiva pink, no icterus. Mouth: Oropharyngeal mucosa moist and pink , no lesions erythema or exudate. Neck: Supple without thyromegaly, masses, or lymphadenopathy.  Lungs: Clear to auscultation bilaterally.  Heart: Regular rate and rhythm, no murmurs rubs or gallops.  Abdomen: Bowel sounds are normal, nontender, nondistended, no hepatosplenomegaly or masses, no abdominal bruits or    hernia , no rebound or guarding.   Rectal: Defer to time of colonoscopy Extremities: No lower extremity edema. No clubbing or deformities.  Neuro: Alert and oriented x 4 , grossly normal neurologically.  Skin: Warm and dry, no rash or jaundice.   Psych: Alert and cooperative, normal mood and affect.  Labs: Lab Results  Component Value Date   WBC 5.8 05/14/2011   HGB 13.3 05/14/2011   HCT 41.5 05/14/2011   MCV 86.1 05/14/2011   PLT 263 05/14/2011   Lab Results  Component Value Date   ALT 12 05/14/2011   AST 15 05/14/2011   ALKPHOS 87 05/14/2011   BILITOT 0.3 05/14/2011   Lab Results  Component Value Date   CREATININE 0.96 05/14/2011   BUN 12 05/14/2011   NA 139 05/14/2011   K 3.4* 05/14/2011   CL 102 05/14/2011   CO2 29 05/14/2011     Imaging Studies: No results found.

## 2011-11-20 NOTE — Assessment & Plan Note (Signed)
?  hoarseness secondary to LPR based on her description of ENT evaluation. Never tried PPI BID. Worth trying increase omeprazole for 3 months but if no significant improvement in hoarseness then she will go back to once daily dosing.

## 2011-11-20 NOTE — Assessment & Plan Note (Signed)
Intermittent rectal bleeding and hemorrhoid flare in setting of constipation. Describes difficulty passing soft stool, ?rectocele. Colonoscopy in near future for evaluation.  I have discussed the risks, alternatives, benefits with regards to but not limited to the risk of reaction to medication, bleeding, infection, perforation and the patient is agreeable to proceed. Written consent to be obtained.  Patient uses OTC hemorrhoid agents with good results. If difficulty with hard stools on frequent basis, add miralax daily prn.

## 2011-11-23 NOTE — Progress Notes (Signed)
Faxed to PCP

## 2011-11-24 ENCOUNTER — Encounter: Payer: Self-pay | Admitting: Gastroenterology

## 2011-11-30 ENCOUNTER — Other Ambulatory Visit: Payer: Self-pay | Admitting: Adult Health

## 2011-11-30 DIAGNOSIS — E049 Nontoxic goiter, unspecified: Secondary | ICD-10-CM

## 2011-12-03 NOTE — Progress Notes (Signed)
TCS MAY 21

## 2011-12-08 ENCOUNTER — Ambulatory Visit (HOSPITAL_COMMUNITY)
Admission: RE | Admit: 2011-12-08 | Discharge: 2011-12-08 | Disposition: A | Discharge status: Home / Self Care | Payer: Medicare Other | Source: Ambulatory Visit | Attending: Adult Health | Admitting: Adult Health

## 2011-12-08 DIAGNOSIS — E049 Nontoxic goiter, unspecified: Secondary | ICD-10-CM | POA: Insufficient documentation

## 2011-12-08 DIAGNOSIS — Z853 Personal history of malignant neoplasm of breast: Secondary | ICD-10-CM | POA: Insufficient documentation

## 2011-12-08 DIAGNOSIS — R490 Dysphonia: Secondary | ICD-10-CM | POA: Insufficient documentation

## 2011-12-10 ENCOUNTER — Encounter (HOSPITAL_COMMUNITY): Payer: Self-pay | Admitting: Pharmacy Technician

## 2011-12-14 ENCOUNTER — Telehealth: Payer: Self-pay | Admitting: Gastroenterology

## 2011-12-14 NOTE — Telephone Encounter (Signed)
Pt called to cancel her procedure and would like to [facility] the second week of June. She can be reached at [phone] and I have called Kim at short stay to let her know. Patient is sick

## 2011-12-14 NOTE — Telephone Encounter (Signed)
Tried to call pt but no answer

## 2011-12-15 ENCOUNTER — Ambulatory Visit (HOSPITAL_COMMUNITY): Admission: RE | Admit: 2011-12-15 | Payer: Medicare Other | Source: Ambulatory Visit | Admitting: Gastroenterology

## 2011-12-15 ENCOUNTER — Encounter (HOSPITAL_COMMUNITY): Admission: RE | Payer: Self-pay | Source: Ambulatory Visit

## 2011-12-15 SURGERY — COLONOSCOPY
Anesthesia: Moderate Sedation

## 2012-04-27 ENCOUNTER — Encounter (HOSPITAL_COMMUNITY): Payer: Self-pay | Admitting: *Deleted

## 2012-04-27 ENCOUNTER — Emergency Department (HOSPITAL_COMMUNITY)
Admission: EM | Admit: 2012-04-27 | Discharge: 2012-04-27 | Disposition: A | Discharge status: Home / Self Care | Payer: Medicare Other | Attending: Emergency Medicine | Admitting: Emergency Medicine

## 2012-04-27 ENCOUNTER — Emergency Department (HOSPITAL_COMMUNITY): Payer: Medicare Other

## 2012-04-27 DIAGNOSIS — Z853 Personal history of malignant neoplasm of breast: Secondary | ICD-10-CM | POA: Insufficient documentation

## 2012-04-27 DIAGNOSIS — R911 Solitary pulmonary nodule: Secondary | ICD-10-CM

## 2012-04-27 DIAGNOSIS — R51 Headache: Secondary | ICD-10-CM | POA: Insufficient documentation

## 2012-04-27 DIAGNOSIS — M129 Arthropathy, unspecified: Secondary | ICD-10-CM | POA: Insufficient documentation

## 2012-04-27 DIAGNOSIS — E7800 Pure hypercholesterolemia, unspecified: Secondary | ICD-10-CM | POA: Insufficient documentation

## 2012-04-27 DIAGNOSIS — R350 Frequency of micturition: Secondary | ICD-10-CM | POA: Insufficient documentation

## 2012-04-27 DIAGNOSIS — R110 Nausea: Secondary | ICD-10-CM | POA: Insufficient documentation

## 2012-04-27 DIAGNOSIS — K219 Gastro-esophageal reflux disease without esophagitis: Secondary | ICD-10-CM | POA: Insufficient documentation

## 2012-04-27 DIAGNOSIS — K5900 Constipation, unspecified: Secondary | ICD-10-CM | POA: Insufficient documentation

## 2012-04-27 DIAGNOSIS — Z79899 Other long term (current) drug therapy: Secondary | ICD-10-CM | POA: Insufficient documentation

## 2012-04-27 DIAGNOSIS — R358 Other polyuria: Secondary | ICD-10-CM | POA: Insufficient documentation

## 2012-04-27 DIAGNOSIS — R109 Unspecified abdominal pain: Secondary | ICD-10-CM

## 2012-04-27 DIAGNOSIS — I10 Essential (primary) hypertension: Secondary | ICD-10-CM | POA: Insufficient documentation

## 2012-04-27 DIAGNOSIS — R3589 Other polyuria: Secondary | ICD-10-CM | POA: Insufficient documentation

## 2012-04-27 LAB — URINALYSIS, ROUTINE W REFLEX MICROSCOPIC
Nitrite: NEGATIVE
Protein, ur: NEGATIVE mg/dL
Specific Gravity, Urine: 1.02 (ref 1.005–1.030)
Urobilinogen, UA: 0.2 mg/dL (ref 0.0–1.0)

## 2012-04-27 LAB — BASIC METABOLIC PANEL
BUN: 9 mg/dL (ref 6–23)
CO2: 29 mEq/L (ref 19–32)
Calcium: 9.5 mg/dL (ref 8.4–10.5)
Chloride: 99 mEq/L (ref 96–112)
Creatinine, Ser: 0.94 mg/dL (ref 0.50–1.10)

## 2012-04-27 LAB — CBC WITH DIFFERENTIAL/PLATELET
Basophils Absolute: 0 10*3/uL (ref 0.0–0.1)
Eosinophils Relative: 2 % (ref 0–5)
HCT: 42.1 % (ref 36.0–46.0)
Hemoglobin: 13.6 g/dL (ref 12.0–15.0)
Lymphocytes Relative: 42 % (ref 12–46)
MCHC: 32.3 g/dL (ref 30.0–36.0)
MCV: 85.7 fL (ref 78.0–100.0)
Monocytes Absolute: 0.2 10*3/uL (ref 0.1–1.0)
Monocytes Relative: 3 % (ref 3–12)
Neutro Abs: 3.4 10*3/uL (ref 1.7–7.7)
RDW: 14.1 % (ref 11.5–15.5)

## 2012-04-27 LAB — POCT PREGNANCY, URINE: Preg Test, Ur: NEGATIVE

## 2012-04-27 LAB — URINE MICROSCOPIC-ADD ON

## 2012-04-27 MED ORDER — KETOROLAC TROMETHAMINE 30 MG/ML IJ SOLN
30.0000 mg | Freq: Once | INTRAMUSCULAR | Status: AC
  Administered 2012-04-27: 30 mg via INTRAVENOUS
  Filled 2012-04-27: qty 1

## 2012-04-27 MED ORDER — OXYCODONE-ACETAMINOPHEN 5-325 MG PO TABS: 1.0000 | ORAL_TABLET | ORAL | Status: AC | PRN

## 2012-04-27 MED ORDER — ONDANSETRON HCL 4 MG/2ML IJ SOLN
4.0000 mg | Freq: Once | INTRAMUSCULAR | Status: AC
  Administered 2012-04-27: 4 mg via INTRAVENOUS
  Filled 2012-04-27: qty 2

## 2012-04-27 MED ORDER — ONDANSETRON 8 MG PO TBDP
8.0000 mg | ORAL_TABLET | Freq: Once | ORAL | Status: AC
  Administered 2012-04-27: 8 mg via ORAL
  Filled 2012-04-27: qty 1

## 2012-04-27 MED ORDER — HYDROMORPHONE HCL PF 1 MG/ML IJ SOLN
1.0000 mg | Freq: Once | INTRAMUSCULAR | Status: AC
  Administered 2012-04-27: 1 mg via INTRAVENOUS
  Filled 2012-04-27: qty 1

## 2012-04-27 MED ORDER — SODIUM CHLORIDE 0.9 % IV SOLN
Freq: Once | INTRAVENOUS | Status: AC
  Administered 2012-04-27: 11:00:00 via INTRAVENOUS

## 2012-04-27 NOTE — ED Provider Notes (Signed)
History  This chart was scribed for Gina Booze, MD by Gina Solis. The patient was seen in room APA15/APA15. Patient's care was started at 1055.     CSN: [id number]  Arrival date & time 04/27/12  1003   First MD Initiated Contact with Patient 04/27/12 1055      Chief Complaint  Patient presents with  . Flank Pain     The history is provided by the patient. No language interpreter was used.    Gina Solis is a 52 y.o. female who presents to the Emergency Department complaining of worsening, moderate to severe, waxing and waning, sharp, pressure-like left flank pain onset 3 days ago. There is associated nausea, HA, polyuria and constipation. Patient denies diarrhea, urinary incontinence or bowel incontinence. Patient rates her flank pain as 8/10. Patient states that the pain is worse when she lays on her left side. Patient has taken Ibuprofen for the pain with mild relief. Patient has a medical history of HTN, breast cancer, hypercholesteremia, GERD, and arthritis.  PCP - Gina Solis   Past Medical History  Diagnosis Date  . Breast cancer 2008    Stage IIIB  . Dermatomyositis   . Hypertension   . Hypercholesteremia   . Arthritis   . GERD (gastroesophageal reflux disease)   . Lymphedema of arm     left  . Multiple thyroid nodules     Past Surgical History  Procedure Date  . Abdominal hysterectomy     only removed uterus  . Tubal ligation 1985  . Mastectomy, partial 2008    left  . Breast reconstruction left    left  . Esophagogastroduodenoscopy 05/02/2007    Small hiatal hernia, otherwise normal stomach./Normal duodenal bulb and second portion of the duodenum/White plaques seen from the proximal to the distal esophagus/ Shallow ulcerations with mild erythema seen scattered throughout the esophagus most pronounced in the distal esophagus.  HSV1    Family History  Problem Relation Age of Onset  . Breast cancer Paternal Aunt   . Colon cancer Maternal Uncle     age 47s   . Kidney failure Mother   . Heart failure Mother   . Colon cancer Paternal Grandfather     ???    History  Substance Use Topics  . Smoking status: Former Smoker    Types: Cigarettes  . Smokeless tobacco: Not on file  . Alcohol Use: No    OB History    Grav Para Term Preterm Abortions TAB SAB Ect Mult Living                  Review of Systems  Gastrointestinal: Positive for nausea and constipation. Negative for vomiting and diarrhea.  Genitourinary: Positive for frequency and flank pain. Negative for difficulty urinating.  Neurological: Positive for headaches.  All other systems reviewed and are negative.    Allergies  Codeine  Home Medications   Current Outpatient Rx  Name Route Sig Dispense Refill  . ACETAMINOPHEN 500 MG PO TABS Oral Take 1,000 mg by mouth every 6 (six) hours as needed. Pain    . CYCLOBENZAPRINE HCL 10 MG PO TABS Oral Take 10 mg by mouth 3 (three) times daily as needed. For muscle pain    . IBUPROFEN 200 MG PO TABS Oral Take 400 mg by mouth every 6 (six) hours as needed. Pain    . AMLODIPINE BESYLATE 5 MG PO TABS Oral Take 5 mg by mouth daily.    Marland Kitchen HYDROCHLOROTHIAZIDE 25 MG PO  TABS Oral Take 25 mg by mouth daily.    Marland Kitchen LORATADINE 10 MG PO TABS Oral Take 10 mg by mouth daily.    Marland Kitchen OMEPRAZOLE 20 MG PO CPDR Oral Take 20 mg by mouth 2 (two) times daily with a meal.      BP 145/85  Pulse 90  Temp 98.1 F (36.7 C) (Oral)  Resp 16  Ht 5\' 3"  (1.6 m)  Wt 249 lb (112.946 kg)  BMI 44.11 kg/m2  SpO2 99%  Physical Exam  Nursing note and vitals reviewed. Constitutional: She is oriented to person, place, and time. She appears well-developed and well-nourished.       Appears uncomfortable.  HENT:  Head: Normocephalic and atraumatic.  Cardiovascular: Normal rate and regular rhythm.   Pulmonary/Chest: Effort normal and breath sounds normal.  Abdominal: Normal appearance. Bowel sounds are decreased. There is CVA tenderness.       Mild left CVA  tenderness.   Musculoskeletal: Normal range of motion.  Neurological: She is alert and oriented to person, place, and time.  Skin: Skin is warm and dry.  Psychiatric: She has a normal mood and affect. Her behavior is normal.    ED Course  Procedures (including critical care time) DIAGNOSTIC STUDIES: Oxygen Saturation is 99% on room air, normal by my interpretation.    COORDINATION OF CARE: 11:02am- Patient informed of current plan for treatment and evaluation and agrees with plan at this time.   Results for orders placed during the hospital encounter of 04/27/12  URINALYSIS, ROUTINE W REFLEX MICROSCOPIC      Component Value Range   Color, Urine YELLOW  YELLOW   APPearance CLEAR  CLEAR   Specific Gravity, Urine 1.020  1.005 - 1.030   pH 7.5  5.0 - 8.0   Glucose, UA NEGATIVE  NEGATIVE mg/dL   Hgb urine dipstick TRACE (*) NEGATIVE   Bilirubin Urine NEGATIVE  NEGATIVE   Ketones, ur NEGATIVE  NEGATIVE mg/dL   Protein, ur NEGATIVE  NEGATIVE mg/dL   Urobilinogen, UA 0.2  0.0 - 1.0 mg/dL   Nitrite NEGATIVE  NEGATIVE   Leukocytes, UA NEGATIVE  NEGATIVE  CBC WITH DIFFERENTIAL      Component Value Range   WBC 6.5  4.0 - 10.5 K/uL   RBC 4.91  3.87 - 5.11 MIL/uL   Hemoglobin 13.6  12.0 - 15.0 g/dL   HCT 16.1  09.6 - 04.5 %   MCV 85.7  78.0 - 100.0 fL   MCH 27.7  26.0 - 34.0 pg   MCHC 32.3  30.0 - 36.0 g/dL   RDW 40.9  81.1 - 91.4 %   Platelets 274  150 - 400 K/uL   Neutrophils Relative 52  43 - 77 %   Neutro Abs 3.4  1.7 - 7.7 K/uL   Lymphocytes Relative 42  12 - 46 %   Lymphs Abs 2.7  0.7 - 4.0 K/uL   Monocytes Relative 3  3 - 12 %   Monocytes Absolute 0.2  0.1 - 1.0 K/uL   Eosinophils Relative 2  0 - 5 %   Eosinophils Absolute 0.2  0.0 - 0.7 K/uL   Basophils Relative 1  0 - 1 %   Basophils Absolute 0.0  0.0 - 0.1 K/uL  BASIC METABOLIC PANEL      Component Value Range   Sodium 137  135 - 145 mEq/L   Potassium 3.5  3.5 - 5.1 mEq/L   Chloride 99  96 - 112 mEq/L  CO2 29  19  - 32 mEq/L   Glucose, Bld 89  70 - 99 mg/dL   BUN 9  6 - 23 mg/dL   Creatinine, Ser 2.13  0.50 - 1.10 mg/dL   Calcium 9.5  8.4 - 08.6 mg/dL   GFR calc non Af Amer 70 (*) >90 mL/min   GFR calc Af Amer 81 (*) >90 mL/min  POCT PREGNANCY, URINE      Component Value Range   Preg Test, Ur NEGATIVE  NEGATIVE  URINE MICROSCOPIC-ADD ON      Component Value Range   Squamous Epithelial / LPF MANY (*) RARE   RBC / HPF 0-2  <3 RBC/hpf   Bacteria, UA RARE  RARE     Ct Abdomen Pelvis Wo Contrast  04/27/2012  *RADIOLOGY REPORT*  Clinical Data: Left flank pain.  CT ABDOMEN AND PELVIS WITHOUT CONTRAST  Technique:  Multidetector CT imaging of the abdomen and pelvis was performed following the standard protocol without intravenous contrast.  Comparison: PET CT 01/31/2007  Findings: A few small patchy densities at the base of the right middle lobe are nonspecific and could be post inflammatory.  A subtle hazy density at the left lung base on sequence 2, image 5 is also nonspecific and measures up to 4 mm.  There is no evidence for free air.  There is no gross abnormality to the liver, gallbladder, spleen or pancreas.  There is a stable 2 cm low density nodule along the left adrenal gland.  Hounsfield units measure one and the findings are suggestive for a benign adenoma.  Normal appearance of the right adrenal gland.  Normal appearance of the right kidney without hydronephrosis. Normal appearance of the left kidney without stones or hydronephrosis. Again noted is prominent fat within the inguinal canals without bowel involvement.  There is no significant free fluid or lymphadenopathy.  There is a retrocecal appendix without acute inflammatory changes.  There are multiple phleboliths in the pelvis.  Fluid in the bladder.  Stable area of lucency with peripheral sclerosis in the left iliac wing.  Degenerative changes at L5-S1.  No acute bony abnormality.  IMPRESSION: No acute abnormalities within the abdomen or pelvis.   Specifically, no evidence for stones or hydronephrosis.  Punctate densities at the lung bases that are nonspecific.  The largest is a 4 mm hazy density at the left lung base. These findings could be postinflammatory in etiology.  If the patient is at high risk for bronchogenic carcinoma, follow-up chest CT at 1 year is recommended.  If the patient is at low risk, no follow-up is needed.  This recommendation follows the consensus statement: Guidelines for Management of Small Pulmonary Nodules Detected on CT Scans:  A Statement from the Fleischner Society as published in Radiology 2005; 237:395-400.   Original Report Authenticated By: Richarda Overlie, M.D.      1. Left flank pain   2. Lung nodule seen on imaging study       MDM  Flank pain which may represent UTI, may represent ureterolithiasis. Urinalysis and CT scan will be obtained.  CT scan shows no evidence of ureterolithiasis, urinalysis is unremarkable. Incidental finding of lung nodule is noted and recommendation for repeat CT scan  In 12 months is relayed to the patient. She got good pain relief from hydromorphone in the emergency department, and was sent home with prescription for Percocet. She is to followup with her PCP in 2 days.       I personally performed  the services described in this documentation, which was scribed in my presence. The recorded information has been reviewed and considered.      Gina Booze, MD 04/27/12 [phone]

## 2012-04-27 NOTE — ED Notes (Signed)
Left flank pain with intermittent nausea began 2 days ago.

## 2012-05-01 ENCOUNTER — Encounter (HOSPITAL_COMMUNITY): Payer: Self-pay

## 2012-05-01 ENCOUNTER — Emergency Department (HOSPITAL_COMMUNITY)
Admission: EM | Admit: 2012-05-01 | Discharge: 2012-05-01 | Disposition: A | Discharge status: Home / Self Care | Payer: Medicare Other | Attending: Emergency Medicine | Admitting: Emergency Medicine

## 2012-05-01 DIAGNOSIS — E7800 Pure hypercholesterolemia, unspecified: Secondary | ICD-10-CM | POA: Insufficient documentation

## 2012-05-01 DIAGNOSIS — Z853 Personal history of malignant neoplasm of breast: Secondary | ICD-10-CM | POA: Insufficient documentation

## 2012-05-01 DIAGNOSIS — M129 Arthropathy, unspecified: Secondary | ICD-10-CM | POA: Insufficient documentation

## 2012-05-01 DIAGNOSIS — R109 Unspecified abdominal pain: Secondary | ICD-10-CM | POA: Insufficient documentation

## 2012-05-01 DIAGNOSIS — I10 Essential (primary) hypertension: Secondary | ICD-10-CM | POA: Insufficient documentation

## 2012-05-01 DIAGNOSIS — R209 Unspecified disturbances of skin sensation: Secondary | ICD-10-CM | POA: Insufficient documentation

## 2012-05-01 DIAGNOSIS — K219 Gastro-esophageal reflux disease without esophagitis: Secondary | ICD-10-CM | POA: Insufficient documentation

## 2012-05-01 DIAGNOSIS — Z79899 Other long term (current) drug therapy: Secondary | ICD-10-CM | POA: Insufficient documentation

## 2012-05-01 DIAGNOSIS — R6883 Chills (without fever): Secondary | ICD-10-CM | POA: Insufficient documentation

## 2012-05-01 DIAGNOSIS — B029 Zoster without complications: Secondary | ICD-10-CM

## 2012-05-01 LAB — CBC WITH DIFFERENTIAL/PLATELET
Basophils Absolute: 0 10*3/uL (ref 0.0–0.1)
Eosinophils Relative: 3 % (ref 0–5)
HCT: 38.7 % (ref 36.0–46.0)
Hemoglobin: 12.9 g/dL (ref 12.0–15.0)
Lymphocytes Relative: 32 % (ref 12–46)
Lymphs Abs: 2.4 10*3/uL (ref 0.7–4.0)
MCV: 84.9 fL (ref 78.0–100.0)
Monocytes Absolute: 0.4 10*3/uL (ref 0.1–1.0)
Monocytes Relative: 5 % (ref 3–12)
Neutro Abs: 4.5 10*3/uL (ref 1.7–7.7)
RBC: 4.56 MIL/uL (ref 3.87–5.11)
WBC: 7.6 10*3/uL (ref 4.0–10.5)

## 2012-05-01 LAB — LIPASE, BLOOD: Lipase: 21 U/L (ref 11–59)

## 2012-05-01 LAB — COMPREHENSIVE METABOLIC PANEL
AST: 15 U/L (ref 0–37)
BUN: 12 mg/dL (ref 6–23)
CO2: 27 mEq/L (ref 19–32)
Calcium: 10.1 mg/dL (ref 8.4–10.5)
Chloride: 95 mEq/L — ABNORMAL LOW (ref 96–112)
Creatinine, Ser: 1.05 mg/dL (ref 0.50–1.10)
GFR calc Af Amer: 71 mL/min — ABNORMAL LOW (ref >90)
GFR calc non Af Amer: 61 mL/min — ABNORMAL LOW (ref >90)
Glucose, Bld: 104 mg/dL — ABNORMAL HIGH (ref 70–99)
Total Bilirubin: 0.3 mg/dL (ref 0.3–1.2)

## 2012-05-01 LAB — URINALYSIS, ROUTINE W REFLEX MICROSCOPIC
Nitrite: NEGATIVE
Protein, ur: NEGATIVE mg/dL
Specific Gravity, Urine: 1.015 (ref 1.005–1.030)
Urobilinogen, UA: 0.2 mg/dL (ref 0.0–1.0)

## 2012-05-01 LAB — URINE MICROSCOPIC-ADD ON

## 2012-05-01 MED ORDER — PREDNISONE 20 MG PO TABS
60.0000 mg | ORAL_TABLET | Freq: Once | ORAL | Status: AC
  Administered 2012-05-01: 60 mg via ORAL
  Filled 2012-05-01: qty 3

## 2012-05-01 MED ORDER — OXYCODONE-ACETAMINOPHEN 5-325 MG PO TABS
1.0000 | ORAL_TABLET | Freq: Once | ORAL | Status: AC
  Administered 2012-05-01: 1 via ORAL
  Filled 2012-05-01: qty 1

## 2012-05-01 MED ORDER — VALACYCLOVIR HCL 500 MG PO TABS
1000.0000 mg | ORAL_TABLET | Freq: Once | ORAL | Status: AC
  Administered 2012-05-01: 1000 mg via ORAL
  Filled 2012-05-01: qty 2

## 2012-05-01 MED ORDER — ONDANSETRON 4 MG PO TBDP
4.0000 mg | ORAL_TABLET | Freq: Once | ORAL | Status: AC
  Administered 2012-05-01: 4 mg via ORAL
  Filled 2012-05-01: qty 1

## 2012-05-01 MED ORDER — SODIUM CHLORIDE 0.9 % IV BOLUS (SEPSIS)
1000.0000 mL | Freq: Once | INTRAVENOUS | Status: AC
  Administered 2012-05-01: 1000 mL via INTRAVENOUS

## 2012-05-01 MED ORDER — ONDANSETRON HCL 4 MG PO TABS: 4.0000 mg | ORAL_TABLET | Freq: Four times a day (QID) | ORAL | Status: DC

## 2012-05-01 MED ORDER — PREDNISONE 50 MG PO TABS: ORAL_TABLET | ORAL | Status: DC

## 2012-05-01 MED ORDER — OXYCODONE-ACETAMINOPHEN 5-325 MG PO TABS: 2.0000 | ORAL_TABLET | ORAL | Status: DC | PRN

## 2012-05-01 MED ORDER — HYDROMORPHONE HCL PF 1 MG/ML IJ SOLN
1.0000 mg | Freq: Once | INTRAMUSCULAR | Status: AC
  Administered 2012-05-01: 1 mg via INTRAVENOUS
  Filled 2012-05-01: qty 1

## 2012-05-01 MED ORDER — ONDANSETRON HCL 4 MG/2ML IJ SOLN
4.0000 mg | Freq: Once | INTRAMUSCULAR | Status: AC
  Administered 2012-05-01: 4 mg via INTRAVENOUS
  Filled 2012-05-01: qty 2

## 2012-05-01 MED ORDER — VALACYCLOVIR HCL 1 G PO TABS: 1000.0000 mg | ORAL_TABLET | Freq: Three times a day (TID) | ORAL | Status: AC

## 2012-05-01 NOTE — ED Provider Notes (Signed)
History  This chart was scribed for Glynn Octave, MD by Ardeen Jourdain. This patient was seen in room APA10/APA10 and the patient's care was started at 2005.  CSN: [id number]  Arrival date & time 05/01/12  1955   First MD Initiated Contact with Patient 05/01/12 2005      Chief Complaint  Patient presents with  . Flank Pain     The history is provided by the patient. No language interpreter was used.    Gina Solis is a 51 y.o. female who presents to the Emergency Department complaining of constant flank pain that started 5 days ago with associated chills, and numbness. She was admitted to the ED 4 days ago, and was discharged. She denies hematuria, fever, difficulty urinating, and bowel or urinary incontence. She denies any history of similar conditions and denies current treatment for breast cancer. She has a h/o breast CA, dermatomyositis and HTN. Pt is a former smoker but denies alcohol use.   Past Medical History  Diagnosis Date  . Breast cancer 2008    Stage IIIB  . Dermatomyositis   . Hypertension   . Hypercholesteremia   . Arthritis   . GERD (gastroesophageal reflux disease)   . Lymphedema of arm     left  . Multiple thyroid nodules     Past Surgical History  Procedure Date  . Abdominal hysterectomy     only removed uterus  . Tubal ligation 1985  . Mastectomy, partial 2008    left  . Breast reconstruction left    left  . Esophagogastroduodenoscopy 05/02/2007    Small hiatal hernia, otherwise normal stomach./Normal duodenal bulb and second portion of the duodenum/White plaques seen from the proximal to the distal esophagus/ Shallow ulcerations with mild erythema seen scattered throughout the esophagus most pronounced in the distal esophagus.  HSV1    Family History  Problem Relation Age of Onset  . Breast cancer Paternal Aunt   . Colon cancer Maternal Uncle     age 51s  . Kidney failure Mother   . Heart failure Mother   . Colon cancer Paternal  Grandfather     ???    History  Substance Use Topics  . Smoking status: Former Smoker    Types: Cigarettes  . Smokeless tobacco: Not on file  . Alcohol Use: No    OB History    Grav Para Term Preterm Abortions TAB SAB Ect Mult Living                  Review of Systems  Allergies  Codeine  Home Medications   Current Outpatient Rx  Name Route Sig Dispense Refill  . ACETAMINOPHEN 500 MG PO TABS Oral Take 1,000 mg by mouth every 6 (six) hours as needed. Pain    . AMLODIPINE BESYLATE 5 MG PO TABS Oral Take 5 mg by mouth daily.    . CYCLOBENZAPRINE HCL 10 MG PO TABS Oral Take 10 mg by mouth 3 (three) times daily as needed. For muscle pain    . HYDROCHLOROTHIAZIDE 25 MG PO TABS Oral Take 25 mg by mouth daily.    . IBUPROFEN 200 MG PO TABS Oral Take 400 mg by mouth every 6 (six) hours as needed. Pain    . LORATADINE 10 MG PO TABS Oral Take 10 mg by mouth daily.    Marland Kitchen OMEPRAZOLE 20 MG PO CPDR Oral Take 20 mg by mouth 2 (two) times daily with a meal.    . ONDANSETRON HCL  4 MG PO TABS Oral Take 1 tablet (4 mg total) by mouth every 6 (six) hours. 12 tablet 0  . OXYCODONE-ACETAMINOPHEN 5-325 MG PO TABS Oral Take 1 tablet by mouth every 4 (four) hours as needed for pain. 6 tablet 0  . OXYCODONE-ACETAMINOPHEN 5-325 MG PO TABS Oral Take 2 tablets by mouth every 4 (four) hours as needed for pain. 15 tablet 0  . PREDNISONE 50 MG PO TABS  1 tablet PO daily 5 tablet 0  . VALACYCLOVIR HCL 1 G PO TABS Oral Take 1 tablet (1,000 mg total) by mouth 3 (three) times daily. 21 tablet 0    Triage Vitals: BP 155/103  Pulse 92  Temp 98.9 F (37.2 C) (Oral)  Resp 16  Ht 5\' 3"  (1.6 m)  Wt 249 lb (112.946 kg)  BMI 44.11 kg/m2  SpO2 100%  Physical Exam  Nursing note and vitals reviewed. Constitutional: She is oriented to person, place, and time. She appears well-developed and well-nourished. No distress.  HENT:  Head: Normocephalic and atraumatic.  Eyes: EOM are normal. Pupils are equal, round,  and reactive to light.  Neck: Normal range of motion. Neck supple. No tracheal deviation present.  Cardiovascular: Normal rate, regular rhythm and normal heart sounds.   Pulmonary/Chest: Effort normal and breath sounds normal. No respiratory distress.  Abdominal: Soft. Bowel sounds are normal. She exhibits no distension. There is no tenderness.  Musculoskeletal: Normal range of motion. She exhibits no edema.       No midline tendernes, 5/5 strength in bilateral lower extremities. Ankle plantar and dorsiflexion intact. Great toe extension intact bilaterally. +2 DP and PT pulses. +2 patellar reflexes bilaterally. Normal gait.   Neurological: She is alert and oriented to person, place, and time.  Skin: Skin is warm and dry. Rash noted. There is erythema.       Erythematous vesicular LLQ rash, dermatomal distribution   Psychiatric: She has a normal mood and affect. Her behavior is normal.    ED Course  Procedures (including critical care time)  DIAGNOSTIC STUDIES: Oxygen Saturation is 100% on room air, normal by my interpretation.    COORDINATION OF CARE:  2016- Discussed treatment plan with pt at bedside and pt agreed to plan. A CBC, metabolic panel, urinalysis and lipase test were all ordered.  2030- Medication Orders- valACYclovir (VALTREX) tablet 1,000 mg Once, predniSONE (DELTASONE) tablet 60 mg Once, sodium chloride 0.9 % bolus 1,000 mL Once, ondansetron (ZOFRAN) injection 4 mg Once, HYDROmorphone (DILAUDID) injection 1 mg Once      Labs Reviewed  COMPREHENSIVE METABOLIC PANEL - Abnormal; Notable for the following:    Sodium 133 (*)     Potassium 3.2 (*)     Chloride 95 (*)     Glucose, Bld 104 (*)     GFR calc non Af Amer 61 (*)     GFR calc Af Amer 71 (*)     All other components within normal limits  URINALYSIS, ROUTINE W REFLEX MICROSCOPIC - Abnormal; Notable for the following:    APPearance CLOUDY (*)     Hgb urine dipstick SMALL (*)     All other components within  normal limits  URINE MICROSCOPIC-ADD ON - Abnormal; Notable for the following:    Squamous Epithelial / LPF FEW (*)     Bacteria, UA MANY (*)     All other components within normal limits  CBC WITH DIFFERENTIAL  LIPASE, BLOOD  URINE CULTURE   No results found.   1. Herpes  zoster       MDM  Left flank pain for the past 5 days. Seen 4 days ago and had negative CT scan and urinalysis. Persistent left low back and flank pain. It does not radiate. No weakness, numbness, tingling, fever, chills, chest pain or shortness of breath. No bowel or bladder incontinence. CT scan reviewed from October 2. Exam today shows beginnings of erythematous vesicular lesions in dermatomal distribution in the left low back.  Suspect UA contamination. Culture sent.  Will treat zoster with valtrex, steroids, pain control. Stable for PCP followup.    I personally performed the services described in this documentation, which was scribed in my presence.  The recorded information has been reviewed and considered.   Glynn Octave, MD 05/01/12 [phone]

## 2012-05-01 NOTE — ED Notes (Signed)
Sharp pain in my left side per pt. Started on Wednesday and I was here on Wednesday and it seems to be getting worse per pt. Nauseated and was vomiting on Wednesday but I have been only nauseated since per pt.

## 2012-05-03 LAB — URINE CULTURE: Colony Count: 75000

## 2012-05-30 ENCOUNTER — Ambulatory Visit: Payer: Medicare Other | Admitting: Family Medicine

## 2012-06-17 ENCOUNTER — Ambulatory Visit (INDEPENDENT_AMBULATORY_CARE_PROVIDER_SITE_OTHER): Payer: Medicare Other | Admitting: Family Medicine

## 2012-06-17 ENCOUNTER — Encounter: Payer: Self-pay | Admitting: Family Medicine

## 2012-06-17 VITALS — BP 132/88 | HR 96 | Resp 18 | Ht 63.75 in | Wt 252.1 lb

## 2012-06-17 DIAGNOSIS — E785 Hyperlipidemia, unspecified: Secondary | ICD-10-CM

## 2012-06-17 DIAGNOSIS — Z853 Personal history of malignant neoplasm of breast: Secondary | ICD-10-CM

## 2012-06-17 DIAGNOSIS — I10 Essential (primary) hypertension: Secondary | ICD-10-CM

## 2012-06-17 DIAGNOSIS — K219 Gastro-esophageal reflux disease without esophagitis: Secondary | ICD-10-CM

## 2012-06-17 NOTE — Assessment & Plan Note (Signed)
5 years cancer free, followed by oncology

## 2012-06-17 NOTE — Assessment & Plan Note (Signed)
Seen by GI continue PPI

## 2012-06-17 NOTE — Assessment & Plan Note (Signed)
Discuss need for weight loss and healthy eating

## 2012-06-17 NOTE — Patient Instructions (Signed)
I will obtain your records F/U with your colonoscopy  Stop the norvasc and continue the HCTZ  F/U 2 months for blood pressure

## 2012-06-17 NOTE — Assessment & Plan Note (Signed)
Check records for last FLP

## 2012-06-17 NOTE — Assessment & Plan Note (Signed)
Uncontrolled, we will d/c norvasc and use HCTZ once a day, I will obtain labs and records

## 2012-06-17 NOTE — Progress Notes (Signed)
  Subjective:    Patient ID: Gina Solis, female    DOB: 10-28-1960, 51 y.o.   MRN: [id number]  HPI  Pt here to establish care. Previous PCP Dr. Felecia Shelling. Oncologist Dr. Jodene Nam Medications and history reviewed Surgeon Dr. Shon Hough Patient has a history of breast cancer diagnosed in 2008. She is status post partial mastectomy with 2 surgeries for reconstruction that have failed. She occasionally gets a pulling sensation in that chest wall area. She had normal mammogram this year. She's a history of hypertension she has not been taking her blood pressure medications as prescribed she is currently on HCTZ and Norvasc. 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 She is due to have colonoscopy set up next week.   Review of Systems  GEN- denies fatigue, fever, weight loss,weakness, recent illness HEENT- denies eye drainage, change in vision, nasal discharge, CVS- denies chest pain, palpitations RESP- denies SOB, cough, wheeze ABD- denies N/V, change in stools, abd pain GU- denies dysuria, hematuria, dribbling, incontinence MSK- denies joint pain, muscle aches, injury Neuro- denies headache, dizziness, syncope, seizure activity      Objective:   Physical Exam GEN- NAD, alert and oriented x3, obese HEENT- PERRL, EOMI, non injected sclera, pink conjunctiva, MMM, oropharynx clear Neck- Supple,  CVS- RRR, no murmur RESP-CTAB EXT- trace pedal edema L >R, well healed scar on left ankle, mild deformity left ankle Pulses- Radial 2+         Assessment & Plan:

## 2012-06-20 ENCOUNTER — Encounter: Payer: Self-pay | Admitting: Gastroenterology

## 2012-06-21 ENCOUNTER — Encounter: Payer: Self-pay | Admitting: Gastroenterology

## 2012-06-21 ENCOUNTER — Ambulatory Visit: Payer: Medicare Other | Admitting: Gastroenterology

## 2012-06-21 ENCOUNTER — Ambulatory Visit (INDEPENDENT_AMBULATORY_CARE_PROVIDER_SITE_OTHER): Payer: Medicare Other | Admitting: Gastroenterology

## 2012-06-21 VITALS — BP 145/87 | HR 88 | Temp 97.4°F | Ht 63.0 in | Wt 251.8 lb

## 2012-06-21 DIAGNOSIS — K219 Gastro-esophageal reflux disease without esophagitis: Secondary | ICD-10-CM

## 2012-06-21 DIAGNOSIS — R1310 Dysphagia, unspecified: Secondary | ICD-10-CM

## 2012-06-21 DIAGNOSIS — Z1211 Encounter for screening for malignant neoplasm of colon: Secondary | ICD-10-CM

## 2012-06-21 MED ORDER — PEG 3350-KCL-NA BICARB-NACL 420 G PO SOLR: 4000.0000 mL | ORAL | Status: DC

## 2012-06-21 MED ORDER — LUBIPROSTONE 24 MCG PO CAPS: 24.0000 ug | ORAL_CAPSULE | Freq: Two times a day (BID) | ORAL | Status: DC

## 2012-06-21 NOTE — Patient Instructions (Addendum)
Please start taking Amitiza 24 mcg twice a day, WITH FOOD to avoid nausea. Hold if you have loose stools.   Review the high fiber diet attached. Continue to take Prilosec twice a day, 30 minutes before breakfast and dinner.   We have set you up for a colonoscopy and upper endoscopy with Dr. Darrick Penna in the near future.   3 month return with Dr. Darrick Penna to discuss constipation, swallowing difficulties.  High-Fiber Diet Fiber is found in fruits, vegetables, and grains. A high-fiber diet encourages the addition of more whole grains, legumes, fruits, and vegetables in your diet. The recommended amount of fiber for adult males is 38 g per day. For adult females, it is 25 g per day. Pregnant and lactating women should get 28 g of fiber per day. If you have a digestive or bowel problem, ask your caregiver for advice before adding high-fiber foods to your diet. Eat a variety of high-fiber foods instead of only a select few type of foods.   PURPOSE  To increase stool bulk.   To make bowel movements more regular to prevent constipation.   To lower cholesterol.   To prevent overeating.  WHEN IS THIS DIET USED?  It may be used if you have constipation and hemorrhoids.   It may be used if you have uncomplicated diverticulosis (intestine condition) and irritable bowel syndrome.   It may be used if you need help with weight management.   It may be used if you want to add it to your diet as a protective measure against atherosclerosis, diabetes, and cancer.  SOURCES OF FIBER  Whole-grain breads and cereals.   Fruits, such as apples, oranges, bananas, berries, prunes, and pears.   Vegetables, such as green peas, carrots, sweet potatoes, beets, broccoli, cabbage, spinach, and artichokes.   Legumes, such split peas, soy, lentils.   Almonds.  FIBER CONTENT IN FOODS Starches and Grains / Dietary Fiber (g)  Cheerios, 1 cup / 3 g   Corn Flakes cereal, 1 cup / 0.7 g   Rice crispy treat cereal, 1  cup / 0.3 g   Instant oatmeal (cooked),  cup / 2 g   Frosted wheat cereal, 1 cup / 5.1 g   Brown, long-grain rice (cooked), 1 cup / 3.5 g   White, long-grain rice (cooked), 1 cup / 0.6 g   Enriched macaroni (cooked), 1 cup / 2.5 g  Legumes / Dietary Fiber (g)  Baked beans (canned, plain, or vegetarian),  cup / 5.2 g   Kidney beans (canned),  cup / 6.8 g   Pinto beans (cooked),  cup / 5.5 g  Breads and Crackers / Dietary Fiber (g)  Plain or honey graham crackers, 2 squares / 0.7 g   Saltine crackers, 3 squares / 0.3 g   Plain, salted pretzels, 10 pieces / 1.8 g   Whole-wheat bread, 1 slice / 1.9 g   White bread, 1 slice / 0.7 g   Raisin bread, 1 slice / 1.2 g   Plain bagel, 3 oz / 2 g   Flour tortilla, 1 oz / 0.9 g   Corn tortilla, 1 small / 1.5 g   Hamburger or hotdog bun, 1 small / 0.9 g  Fruits / Dietary Fiber (g)  Apple with skin, 1 medium / 4.4 g   Sweetened applesauce,  cup / 1.5 g   Banana,  medium / 1.5 g   Grapes, 10 grapes / 0.4 g   Orange, 1 small / 2.3  g   Raisin, 1.5 oz / 1.6 g   Melon, 1 cup / 1.4 g  Vegetables / Dietary Fiber (g)  Green beans (canned),  cup / 1.3 g   Carrots (cooked),  cup / 2.3 g   Broccoli (cooked),  cup / 2.8 g   Peas (cooked),  cup / 4.4 g   Mashed potatoes,  cup / 1.6 g   Lettuce, 1 cup / 0.5 g   Corn (canned),  cup / 1.6 g   Tomato,  cup / 1.1 g  Document Released: 07/13/2005 Document Revised: 01/12/2012 Document Reviewed: 10/15/2011 [facility] Patient Information 2013 [place], [place].

## 2012-06-21 NOTE — Assessment & Plan Note (Signed)
51 year old somewhat overdue for initial screening colonoscopy. Hx of chronic constipation, scant hematochezia in the past likely benign anorectal source. No first-degree relatives with hx of colon cancer. Failed laxatives in the past.   Proceed with initiation of Amitiza 24 mcg po BID.  Proceed with TCS with Dr. Darrick Penna in near future: the risks, benefits, and alternatives have been discussed with the patient in detail. The patient states understanding and desires to proceed. 3 mos f/u

## 2012-06-21 NOTE — Assessment & Plan Note (Signed)
EGD/ED at time of TCS.

## 2012-06-21 NOTE — Progress Notes (Signed)
Referring Provider: Salley Scarlet, MD Primary Care Physician:  Milinda Antis, MD Primary Gastroenterologist: Dr. Darrick Penna   Chief Complaint  Patient presents with  . Colonoscopy    HPI:   51 year old female who presents today for a visit prior to initial screening colonoscopy. She is well-known to [place] from the past, with hx of GERD. Last seen in April 2013, with question of LPR, hoarseness. Increased PPI to BID with improvement. Occasionally has to grab throat to swallow saliva. Intermittent choking on meat. Notes abdominal bloating, days without BM, chronic constipation. Remote hx of hematochezia, scant. Not taking anything for constipation. States has tried laxatives in past without relief. No prior colonoscopy.   Past Medical History  Diagnosis Date  . Breast cancer 2008    Stage IIIB  . Dermatomyositis   . Hypertension   . Hypercholesteremia   . Arthritis   . GERD (gastroesophageal reflux disease)   . Lymphedema of arm     left  . Multiple thyroid nodules     Past Surgical History  Procedure Date  . Abdominal hysterectomy     only removed uterus  . Tubal ligation 1985  . Mastectomy, partial 2008    left  . Breast reconstruction left    X 2, trying again in spring.   . Esophagogastroduodenoscopy 05/02/2007    YNW:GNFAO hiatal hernia, otherwise normal stomach./Normal duodenal bulb and second portion of the duodenum/White plaques seen from the proximal to the distal esophagus/ Shallow ulcerations with mild erythema seen scattered throughout the esophagus most pronounced in the distal esophagus.  HSV1    Current Outpatient Prescriptions  Medication Sig Dispense Refill  . acetaminophen (TYLENOL) 500 MG tablet Take 1,000 mg by mouth every 6 (six) hours as needed. Pain      . diclofenac (VOLTAREN) 75 MG EC tablet Take 75 mg by mouth 2 (two) times daily.      . hydrochlorothiazide (HYDRODIURIL) 25 MG tablet Take 25 mg by mouth daily.      Marland Kitchen omeprazole (PRILOSEC) 20 MG  capsule Take 20 mg by mouth 2 (two) times daily with a meal.      . traMADol (ULTRAM) 50 MG tablet Take 50 mg by mouth as needed.        Allergies as of 06/21/2012 - Review Complete 06/21/2012  Allergen Reaction Noted  . Codeine Nausea Only 11/20/2011    Family History  Problem Relation Age of Onset  . Breast cancer Paternal Aunt   . Colon cancer Maternal Uncle     age 17s  . Kidney failure Mother   . Heart failure Mother   . Colon cancer Paternal Grandfather     ???    History   Social History  . Marital Status: Divorced    Spouse Name: N/A    Number of Children: 3  . Years of Education: N/A   Occupational History  . disabled    Social History Main Topics  . Smoking status: Former Smoker -- 0.5 packs/day    Types: Cigarettes  . Smokeless tobacco: None     Comment: quit years ago  . Alcohol Use: No  . Drug Use: No  . Sexually Active: None   Other Topics Concern  . None   Social History Narrative  . None    Review of Systems: Gen: Denies fever, chills, anorexia. Denies fatigue, weakness, weight loss.  CV: Denies chest pain, palpitations, syncope, peripheral edema, and claudication. Resp: +DOE GI: SEE HPI Derm: Denies rash, itching, dry  skin Psych: Denies depression, anxiety, memory loss, confusion. No homicidal or suicidal ideation.  Heme: Denies bruising, bleeding, and enlarged lymph nodes.  Physical Exam: BP 145/87  Pulse 88  Temp 97.4 F (36.3 C) (Temporal)  Ht 5\' 3"  (1.6 m)  Wt 251 lb 12.8 oz (114.216 kg)  BMI 44.60 kg/m2 General:   Alert and oriented. No distress noted. Pleasant and cooperative.  Head:  Normocephalic and atraumatic. Eyes:  Conjuctiva clear without scleral icterus. Mouth:  Oral mucosa pink and moist. Good dentition. No lesions. Neck:  Supple, without mass or thyromegaly. Heart:  S1, S2 present without murmurs, rubs, or gallops. Regular rate and rhythm. Abdomen:  +BS, soft, non-tender and non-distended. No rebound or guarding.  No HSM or masses noted. Msk:  Symmetrical without gross deformities. Normal posture. Extremities:  Trace edema bilaterally Neurologic:  Alert and  oriented x4;  grossly normal neurologically. Skin:  Intact without significant lesions or rashes. Cervical Nodes:  No significant cervical adenopathy. Psych:  Alert and cooperative. Normal mood and affect.

## 2012-06-21 NOTE — Assessment & Plan Note (Signed)
Chronic, improvement of symptoms with PPI BID. However, notes intermittent dysphagia. Last EGD in remote past by Dr. Darrick Penna, 2008. Dysphagia may be secondary to uncontrolled GERD, query web, ring, or stricture.   Continue Prilosec BID Proceed with upper endoscopy in the near future with Dr. Darrick Penna. The risks, benefits, and alternatives have been discussed in detail with patient. They have stated understanding and desire to proceed.

## 2012-06-21 NOTE — Progress Notes (Signed)
Faxed to PCP

## 2012-07-04 ENCOUNTER — Encounter (HOSPITAL_COMMUNITY): Payer: Self-pay | Admitting: Pharmacy Technician

## 2012-07-14 MED ORDER — SODIUM CHLORIDE 0.45 % IV SOLN
INTRAVENOUS | Status: DC
  Administered 2012-07-15: 1000 mL via INTRAVENOUS

## 2012-07-15 ENCOUNTER — Encounter (HOSPITAL_COMMUNITY): Payer: Self-pay | Admitting: *Deleted

## 2012-07-15 ENCOUNTER — Encounter (HOSPITAL_COMMUNITY)
Admission: RE | Disposition: A | Discharge status: Home / Self Care | Payer: Self-pay | Source: Ambulatory Visit | Attending: Gastroenterology

## 2012-07-15 ENCOUNTER — Ambulatory Visit (HOSPITAL_COMMUNITY)
Admission: RE | Admit: 2012-07-15 | Discharge: 2012-07-15 | Disposition: A | Discharge status: Home / Self Care | Payer: Medicare Other | Source: Ambulatory Visit | Attending: Gastroenterology | Admitting: Gastroenterology

## 2012-07-15 DIAGNOSIS — K621 Rectal polyp: Secondary | ICD-10-CM

## 2012-07-15 DIAGNOSIS — K2940 Chronic atrophic gastritis without bleeding: Secondary | ICD-10-CM | POA: Insufficient documentation

## 2012-07-15 DIAGNOSIS — K219 Gastro-esophageal reflux disease without esophagitis: Secondary | ICD-10-CM

## 2012-07-15 DIAGNOSIS — K620 Anal polyp: Secondary | ICD-10-CM

## 2012-07-15 DIAGNOSIS — Z1211 Encounter for screening for malignant neoplasm of colon: Secondary | ICD-10-CM

## 2012-07-15 DIAGNOSIS — K222 Esophageal obstruction: Secondary | ICD-10-CM | POA: Insufficient documentation

## 2012-07-15 DIAGNOSIS — R1310 Dysphagia, unspecified: Secondary | ICD-10-CM

## 2012-07-15 DIAGNOSIS — D131 Benign neoplasm of stomach: Secondary | ICD-10-CM | POA: Insufficient documentation

## 2012-07-15 DIAGNOSIS — K2970 Gastritis, unspecified, without bleeding: Secondary | ICD-10-CM

## 2012-07-15 DIAGNOSIS — D128 Benign neoplasm of rectum: Secondary | ICD-10-CM | POA: Insufficient documentation

## 2012-07-15 DIAGNOSIS — K2990 Gastroduodenitis, unspecified, without bleeding: Secondary | ICD-10-CM

## 2012-07-15 DIAGNOSIS — D126 Benign neoplasm of colon, unspecified: Secondary | ICD-10-CM | POA: Insufficient documentation

## 2012-07-15 DIAGNOSIS — I10 Essential (primary) hypertension: Secondary | ICD-10-CM | POA: Insufficient documentation

## 2012-07-15 DIAGNOSIS — K648 Other hemorrhoids: Secondary | ICD-10-CM | POA: Insufficient documentation

## 2012-07-15 HISTORY — PX: COLONOSCOPY WITH ESOPHAGOGASTRODUODENOSCOPY (EGD): SHX5779

## 2012-07-15 SURGERY — COLONOSCOPY WITH ESOPHAGOGASTRODUODENOSCOPY (EGD)
Anesthesia: Moderate Sedation

## 2012-07-15 MED ORDER — STERILE WATER FOR IRRIGATION IR SOLN
Status: DC | PRN
  Administered 2012-07-15: 10:00:00

## 2012-07-15 MED ORDER — MIDAZOLAM HCL 5 MG/5ML IJ SOLN
INTRAMUSCULAR | Status: DC | PRN
  Administered 2012-07-15: 1 mg via INTRAVENOUS
  Administered 2012-07-15 (×2): 2 mg via INTRAVENOUS
  Administered 2012-07-15: 1 mg via INTRAVENOUS

## 2012-07-15 MED ORDER — MEPERIDINE HCL 100 MG/ML IJ SOLN
INTRAMUSCULAR | Status: AC
  Filled 2012-07-15: qty 2

## 2012-07-15 MED ORDER — MEPERIDINE HCL 100 MG/ML IJ SOLN
INTRAMUSCULAR | Status: DC | PRN
  Administered 2012-07-15: 50 mg via INTRAVENOUS
  Administered 2012-07-15 (×2): 25 mg via INTRAVENOUS

## 2012-07-15 MED ORDER — BUTAMBEN-TETRACAINE-BENZOCAINE 2-2-14 % EX AERO
INHALATION_SPRAY | CUTANEOUS | Status: DC | PRN
  Administered 2012-07-15: 2 via TOPICAL

## 2012-07-15 MED ORDER — MIDAZOLAM HCL 5 MG/5ML IJ SOLN
INTRAMUSCULAR | Status: AC
  Filled 2012-07-15: qty 10

## 2012-07-15 MED ORDER — MINERAL OIL PO OIL
TOPICAL_OIL | ORAL | Status: AC
  Filled 2012-07-15: qty 30

## 2012-07-15 NOTE — H&P (Signed)
Primary Care Physician:  Milinda Antis, MD Primary Gastroenterologist:  Dr. Darrick Penna  Pre-Procedure History & Physical: HPI:  Gina Solis is a 51 y.o. female here for dyspepsia & screening.  Past Medical History  Diagnosis Date  . Breast cancer 2008    Stage IIIB  . Dermatomyositis   . Hypertension   . Hypercholesteremia   . Arthritis   . GERD (gastroesophageal reflux disease)   . Lymphedema of arm     left  . Multiple thyroid nodules     Past Surgical History  Procedure Date  . Abdominal hysterectomy     only removed uterus  . Tubal ligation 1985  . Mastectomy, partial 2008    left  . Breast reconstruction left    X 2, trying again in spring.   . Esophagogastroduodenoscopy 05/02/2007    WUJ:WJXBJ hiatal hernia, otherwise normal stomach./Normal duodenal bulb and second portion of the duodenum/White plaques seen from the proximal to the distal esophagus/ Shallow ulcerations with mild erythema seen scattered throughout the esophagus most pronounced in the distal esophagus.  HSV1    Prior to Admission medications   Medication Sig Start Date End Date Taking? Authorizing Provider  acetaminophen (TYLENOL) 500 MG tablet Take 1,000 mg by mouth every 6 (six) hours as needed. Pain   Yes Historical Provider, MD  diclofenac (VOLTAREN) 75 MG EC tablet Take 75 mg by mouth 2 (two) times daily.   Yes Historical Provider, MD  hydrochlorothiazide (HYDRODIURIL) 25 MG tablet Take 25 mg by mouth daily.   Yes Historical Provider, MD  lubiprostone (AMITIZA) 24 MCG capsule Take 1 capsule (24 mcg total) by mouth 2 (two) times daily with a meal. 06/21/12  Yes Nira Retort, NP  omeprazole (PRILOSEC) 20 MG capsule Take 20 mg by mouth 2 (two) times daily with a meal. 11/20/11  Yes Tiffany Kocher, PA  traMADol (ULTRAM) 50 MG tablet Take 50 mg by mouth every 6 (six) hours as needed. Pain.   Yes Historical Provider, MD    Allergies as of 06/21/2012 - Review Complete 06/21/2012  Allergen Reaction  Noted  . Codeine Nausea Only 11/20/2011    Family History  Problem Relation Age of Onset  . Breast cancer Paternal Aunt   . Colon cancer Maternal Uncle     age 107s  . Kidney failure Mother   . Heart failure Mother   . Colon cancer Paternal Grandfather     ???    History   Social History  . Marital Status: Divorced    Spouse Name: N/A    Number of Children: 3  . Years of Education: N/A   Occupational History  . disabled    Social History Main Topics  . Smoking status: Former Smoker -- 0.5 packs/day    Types: Cigarettes  . Smokeless tobacco: Not on file     Comment: quit years ago  . Alcohol Use: No  . Drug Use: No  . Sexually Active: Not on file   Other Topics Concern  . Not on file   Social History Narrative  . No narrative on file    Review of Systems: See HPI, otherwise negative ROS   Physical Exam: BP 135/74  Pulse 90  Temp 98 F (36.7 C) (Oral)  Resp 25  Ht 5\' 3"  (1.6 m)  Wt 249 lb (112.946 kg)  BMI 44.11 kg/m2  SpO2 97% General:   Alert,  pleasant and cooperative in NAD Head:  Normocephalic and atraumatic. Neck:  Supple; Lungs:  Clear throughout to auscultation.    Heart:  Regular rate and rhythm. Abdomen:  Soft, nontender and nondistended. Normal bowel sounds, without guarding, and without rebound.   Neurologic:  Alert and  oriented x4;  grossly normal neurologically.  Impression/Plan:    SCREENING/dyspepsia  Plan:  1. TCS/EGD TODAY

## 2012-07-15 NOTE — Op Note (Signed)
[facility] [address] [place], [address]   COLONOSCOPY PROCEDURE REPORT  PATIENT: Gina Solis, Gina Solis  MR#: [id number] BIRTHDATE: 21-Jun-1961 , 51  yrs. old GENDER: Female ENDOSCOPIST: Jonette Eva, MD REFERRED [id number] [place], M.D.  Glenford Peers, M.D. PROCEDURE DATE:  07/15/2012 PROCEDURE:   Colonoscopy with cold biopsy polypectomy INDICATIONS:Average risk patient for colon cancer. MEDICATIONS: Demerol 100 mg IV and Versed 5 mg IV  DESCRIPTION OF PROCEDURE:    Physical exam was performed.  Informed consent was obtained from the patient after explaining the benefits, risks, and alternatives to procedure.  The patient was connected to monitor and placed in left lateral position. Continuous oxygen was provided by nasal cannula and IV medicine administered through an indwelling cannula.  After administration of sedation and rectal exam, the patients rectum was intubated and the [id number] ([id number]) and Pentax Colonoscope [id number]  colonoscope was advanced under direct visualization to the cecum.  The scope was removed slowly by carefully examining the color, texture, anatomy, and integrity mucosa on the way out.  The patient was recovered in endoscopy and discharged home in satisfactory condition.       COLON FINDINGS: Two sessile polyps measuring 3-4 mm in size were found in the ascending colon and rectum.  A polypectomy was performed with cold forceps.  , The colon mucosa was otherwise normal.  , and Moderate sized internal hemorrhoids were found.  PREP QUALITY: excellent. CECAL W/D TIME: 14 minutes  COMPLICATIONS: None  ENDOSCOPIC IMPRESSION: 1.   Two sessile polyps measuring 3-4 mm in size were found in the ascending colon and rectum; polypectomy was performed with cold forceps 2.   The colon mucosa was otherwise normal 3.   Moderate sized internal hemorrhoids   RECOMMENDATIONS: AWAIT BIOPSY HIGH FIBER DIET TCS IN 10  YEARS       _______________________________ Rosalie DoctorJonette Eva, MD 07/15/2012 10:11 AM     PATIENT NAME:  Gina Solis, Gina Solis MR#: [id number]

## 2012-07-18 ENCOUNTER — Ambulatory Visit: Payer: Medicare Other | Admitting: Family Medicine

## 2012-07-19 ENCOUNTER — Telehealth: Payer: Self-pay | Admitting: Gastroenterology

## 2012-07-19 NOTE — Telephone Encounter (Signed)
Please call pt. She had A  simple adenoma removed from her colon. HER stomach Bx shows gastritis.   CONTINUE OMEPRAZOLE.   AVOID ITEMS THAT TRIGGER GASTRITIS.   FOLLOW A HIGH FIBER/LOW FAT DIET. AVOID ITEMS THAT CAUSE BLOATING.   FOLLOW UP IN 3 MOS WITH DR. Kaliana Albino.  TCS IN 10 YEARS.

## 2012-07-19 NOTE — Telephone Encounter (Signed)
Called and informed pt. She said her throat is still a little sore. I told her to use some chloraseptic spray. She said she has and it helps some but it gets worse at night. Please advise!

## 2012-07-19 NOTE — Telephone Encounter (Signed)
REVIEWED. AGREE. 

## 2012-07-19 NOTE — Op Note (Signed)
[facility] [address] [place], [address]   ENDOSCOPY PROCEDURE REPORT  PATIENT: Gina Solis, Gina Solis  MR#: [id number] BIRTHDATE: 17-May-1961 , 51  yrs. old GENDER: Female  ENDOSCOPIST: Jonette Eva, MD REFFERED OZ:[id number] [place], M.D.  PROCEDURE DATE:  07/15/2012 PROCEDURE:   EGD with biopsy and EGD with dilatation over guidewire   INDICATIONS:1.  dyspepsia.   2.  dysphagia. MEDICATIONS: TCS+ Versed 1mg  IV TOPICAL ANESTHETIC: Cetacaine Spray  DESCRIPTION OF PROCEDURE:   After the risks benefits and alternatives of the procedure were thoroughly explained, informed consent was obtained.  The EC-3890Li ([id number]) and EG-[id number] ([id number])  endoscope was introduced through the mouth and advanced to the second portion of the duodenum. The instrument was slowly withdrawn as the mucosa was carefully examined.  Prior to withdrawal of the scope, the guidwire was placed.  The esophagus was dilated successfully.  The patient was recovered in endoscopy and discharged home in satisfactory condition.      DIFFICULT TO PASS THE DIAGNOSTIC GASTROSCOPE INTO THE UPPER ESOPHAGUS DUE TO POSSIBLE ESOPHAGEAL WEB.   ESOPHAGUS: A stricture was found at the gastroesophageal junction.  The stenosis was traversable with the endoscope.  STOMACH: A smooth sessile polyp measuring 6 mm in size was found in the gastric body.  A polypectomy was performed with a cold forceps. Mild non-erosive gastritis (inflammation) was found in the gastric antrum.  Multiple biopsies were performed.  DUODENUM: The duodenal mucosa showed no abnormalities in the bulb and second portion of the duodenum.  Dilation was then performed at the gastroesphageal junction  PROXIMAL ESOPHAGUS. Dilator: Savary over guidewire Size(s): 12.8-16 MM MILD TO MODERATE RESISTANCE  COMPLICATIONS: There were no complications.   ENDOSCOPIC IMPRESSION: 1.   DIFFICULT TO PASS THE DIAGNOSTIC GASTROSCOPE INTO THE  UPPER ESOPHAGUS DUE TO POSSIBLE ESOPHAGEAL WEB. 2.   Stricture was found at the gastroesophageal junction 3.   Sessile polyp measuring 6 mm in size was found in the gastric body 4.   Non-erosive gastritis (inflammation) was found in the gastric antrum; multiple biopsies 5.   The duodenal mucosa showed no abnormalities in the bulb and second portion of the duodenum     RECOMMENDATIONS: CONTINUE OMEPRAZOLE.  AVOID ITEMS THAT TRIGGER GASTRITIS.  FOLLOW A HIGH FIBER/LOW FAT DIET.  AVOID ITEMS THAT CAUSE BLOATING.   BIOPSY RESULTS WILL BE BACK IN 7 DAYS.  FOLLOW UP IN 3 MOS WITH DR.  FIELDS.      _______________________________ Rosalie DoctorJonette Eva, MD 07/19/2012 11:07 AM      PATIENT NAME:  Gina Solis, Gina Solis MR#: [id number]

## 2012-07-19 NOTE — Telephone Encounter (Signed)
Path faxed to PCP, recalls made 

## 2012-07-22 ENCOUNTER — Encounter (HOSPITAL_COMMUNITY): Payer: Self-pay | Admitting: Gastroenterology

## 2012-07-25 ENCOUNTER — Encounter: Payer: Self-pay | Admitting: Family Medicine

## 2012-07-25 ENCOUNTER — Ambulatory Visit (INDEPENDENT_AMBULATORY_CARE_PROVIDER_SITE_OTHER): Payer: Medicare Other | Admitting: Family Medicine

## 2012-07-25 VITALS — BP 128/80 | HR 95 | Resp 18 | Ht 63.75 in | Wt 253.0 lb

## 2012-07-25 DIAGNOSIS — R21 Rash and other nonspecific skin eruption: Secondary | ICD-10-CM

## 2012-07-25 DIAGNOSIS — R229 Localized swelling, mass and lump, unspecified: Secondary | ICD-10-CM

## 2012-07-25 DIAGNOSIS — I10 Essential (primary) hypertension: Secondary | ICD-10-CM

## 2012-07-25 DIAGNOSIS — L659 Nonscarring hair loss, unspecified: Secondary | ICD-10-CM

## 2012-07-25 MED ORDER — SULFAMETHOXAZOLE-TRIMETHOPRIM 800-160 MG PO TABS: 1.0000 | ORAL_TABLET | Freq: Two times a day (BID) | ORAL | Status: AC

## 2012-07-25 NOTE — Patient Instructions (Addendum)
Take blood pressure medication every day Antibiotics for legs  Nizoral shampoo for hair  F/U 3 months

## 2012-07-26 ENCOUNTER — Encounter: Payer: Self-pay | Admitting: Family Medicine

## 2012-07-26 DIAGNOSIS — L659 Nonscarring hair loss, unspecified: Secondary | ICD-10-CM | POA: Insufficient documentation

## 2012-07-26 DIAGNOSIS — R229 Localized swelling, mass and lump, unspecified: Secondary | ICD-10-CM | POA: Insufficient documentation

## 2012-07-26 DIAGNOSIS — R21 Rash and other nonspecific skin eruption: Secondary | ICD-10-CM | POA: Insufficient documentation

## 2012-07-26 NOTE — Assessment & Plan Note (Signed)
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

## 2012-07-26 NOTE — Assessment & Plan Note (Signed)
Treat with antibiotics, per above, referral to derm may need biopsy done as recurrent

## 2012-07-26 NOTE — Progress Notes (Signed)
  Subjective:    Patient ID: Gina Solis, female    DOB: 12-07-1960, 51 y.o.   MRN: [id number]  HPI   Knots on skin on and off for past year, tender to touch, get red then turn dark black, previous PCP gave a cream but this did not help  Worried about balding in scalp, after breast cancer treatments saw dermatology, used various shampoos but hair has not grown back and scalp still gets itchy and dry  Taking BP meds most days, no SE  Review of Systems - per above   GEN- denies fatigue, fever, weight loss,weakness, recent illness HEENT- denies eye drainage, change in vision, nasal discharge, CVS- denies chest pain, palpitations RESP- denies SOB, cough, wheeze Neuro- denies headache, dizziness, syncope, seizure activity      Objective:   Physical Exam GEN- NAD, alert and oriented x3 HEENT- PERRL, EOMI, non injected sclera, pink conjunctiva, MMM, oropharynx clear CVS- RRR, no murmur RESP-CTAB EXT- Trace edema Skin- 4 small subcutanous nodules felt on left thigh, at surface, mild erythema and hyperpigmentation, no fluctuance, mild TTP, scarred lesion right thigh, scarring along frontal scalp line, dandruff noted in scalp Pulses- Radial, DP- 2+        Assessment & Plan:

## 2012-07-26 NOTE — Assessment & Plan Note (Signed)
Improved, reiterated compliance with medications

## 2012-07-26 NOTE — Assessment & Plan Note (Signed)
Treat with bactrim for possible infection as cause

## 2012-09-16 ENCOUNTER — Ambulatory Visit: Payer: Medicare Other | Admitting: Family Medicine

## 2012-09-20 ENCOUNTER — Telehealth: Payer: Self-pay | Admitting: Family Medicine

## 2012-09-20 ENCOUNTER — Encounter: Payer: Self-pay | Admitting: Family Medicine

## 2012-09-20 DIAGNOSIS — M3390 Dermatopolymyositis, unspecified, organ involvement unspecified: Secondary | ICD-10-CM | POA: Insufficient documentation

## 2012-09-20 NOTE — Telephone Encounter (Signed)
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 Her scalp is improved with the 2 medications that they prescribed her she does not want any further referrals and she's due to have her mammogram and follow with oncology in April. At this point we will hold as the patient is doing well

## 2012-09-22 ENCOUNTER — Encounter: Payer: Self-pay | Admitting: Gastroenterology

## 2012-09-22 NOTE — Progress Notes (Signed)
REVIEWED.   EGD/dil DEC 2013 GASTRITIS, benign polyp  TCS DEC 2013 SIMPLE ADENOMA

## 2012-09-29 ENCOUNTER — Encounter: Payer: Self-pay | Admitting: *Deleted

## 2012-10-03 ENCOUNTER — Telehealth: Payer: Self-pay | Admitting: Family Medicine

## 2012-10-03 ENCOUNTER — Encounter (HOSPITAL_COMMUNITY): Payer: Self-pay | Admitting: Oncology

## 2012-10-04 NOTE — Telephone Encounter (Signed)
She will call Dr Mariel Sleet

## 2012-10-04 NOTE — Telephone Encounter (Signed)
Please have her contact her oncology nurse, they get these ordered or sent to [facility], I do not order mastectomy bras and would not know any size or type of insert she would need. Even if she has not been to oncology in a while they would know more information

## 2012-10-04 NOTE — Telephone Encounter (Signed)
Patient wants mastectomy bras and inserts sent too [facility]

## 2012-10-05 ENCOUNTER — Telehealth: Payer: Self-pay

## 2012-10-05 ENCOUNTER — Telehealth (HOSPITAL_COMMUNITY): Payer: Self-pay | Admitting: Oncology

## 2012-10-05 NOTE — Telephone Encounter (Signed)
Mastectomy bras and prosthesis (left) # 6 with the diagnosis. I have all this written on a letterhead. Want me to stamp and fax it in?

## 2012-10-05 NOTE — Telephone Encounter (Signed)
Patient called demanding a refill on her mastectomy bras and prosthesis.  We are unable to fill this Rx at this time until the patient is seen in the clinic.  She was last seen on 05/14/2011 (17 months ago).  She missed her appointment on 11/11/2011 as a no show.  She was informed of this information.  Once she is seen in the office, would be pleased to refill this Rx.  Clinic policy is not to refill Rx if the patient has not been seen in 12 months unless otherwise indicated.  Patient is informed of this information.  She was offered a follow-up appointment and she wishes to come to the office in person to schedule an appointment.   KEFALAS,THOMAS

## 2012-10-05 NOTE — Telephone Encounter (Signed)
Said Dr Mariel Sleet is out of the office and they told her she could get it from her PCP. All that has to be written is mastectomy bras with prosthesis. They will fit her for it at [facility]othe[facility]ary. She has a banquet this weekend and she needs the prosthesis for her bra asap

## 2012-10-05 NOTE — Telephone Encounter (Signed)
Yes you can stamp and fax

## 2012-10-05 NOTE — Telephone Encounter (Signed)
noted 

## 2012-11-02 ENCOUNTER — Telehealth: Payer: Self-pay | Admitting: Family Medicine

## 2012-11-02 NOTE — Telephone Encounter (Signed)
Called and left detailed msg notifying patient that she will have to be seen in the office if she is needing abt for sinus.

## 2012-11-04 ENCOUNTER — Encounter: Payer: Self-pay | Admitting: Family Medicine

## 2012-11-04 ENCOUNTER — Ambulatory Visit (INDEPENDENT_AMBULATORY_CARE_PROVIDER_SITE_OTHER): Payer: Medicaid Other | Admitting: Family Medicine

## 2012-11-04 VITALS — BP 134/82 | HR 83 | Resp 18 | Ht 63.75 in | Wt 256.0 lb

## 2012-11-04 DIAGNOSIS — J019 Acute sinusitis, unspecified: Secondary | ICD-10-CM

## 2012-11-04 DIAGNOSIS — M7700 Medial epicondylitis, unspecified elbow: Secondary | ICD-10-CM | POA: Insufficient documentation

## 2012-11-04 DIAGNOSIS — J040 Acute laryngitis: Secondary | ICD-10-CM

## 2012-11-04 DIAGNOSIS — M7701 Medial epicondylitis, right elbow: Secondary | ICD-10-CM

## 2012-11-04 MED ORDER — AMOXICILLIN 500 MG PO TABS: 500.0000 mg | ORAL_TABLET | Freq: Three times a day (TID) | ORAL | Status: DC

## 2012-11-04 MED ORDER — DICLOFENAC SODIUM 75 MG PO TBEC: 75.0000 mg | DELAYED_RELEASE_TABLET | Freq: Two times a day (BID) | ORAL | Status: DC

## 2012-11-04 MED ORDER — LORATADINE 10 MG PO TABS: 10.0000 mg | ORAL_TABLET | Freq: Every day | ORAL | Status: DC

## 2012-11-04 NOTE — Progress Notes (Signed)
  Subjective:    Patient ID: Gina Solis, female    DOB: [birth year], 52 y.o.   MRN: [id number]  HPI  Patient presents with sinus pressure drainage headache sore throat but the nausea-like feeling for the past week. She feels her sinuses draining down the back of her throat. She also admits to subjective fever fatigue and losing her voice.  Right elbow pain for the past couple of weeks. She does not remember any particular injury cannot tell if it is swollen. Has not taken any over-the-counter medications.   Review of Systems  GEN- + fatigue, +fever, weight loss,weakness, recent illness HEENT- denies eye drainage, change in vision, +nasal discharge, CVS- denies chest pain, palpitations RESP- denies SOB, cough, wheeze ABD- denies N/V, change in stools, abd pain GU- denies dysuria, hematuria, dribbling, incontinence MSK- + joint pain, muscle aches, injury Neuro- denies headache, dizziness, syncope, seizure activity      Objective:   Physical Exam GEN- NAD, alert and oriented x3 HEENT- PERRL, EOMI, non injected sclera, pink conjunctiva, MMM, oropharynx mild injection, TM clear bilat no effusion,  + maxillary sinus tenderness, inflammed turbinates,  Nasal drainage  Neck- Supple, no LAD CVS- RRR, no murmur RESP-CTAB EXT- No edema Pulses- Radial 2+ MSK- Right elbow- no effusion, though large habitus, TTP over medical epicondyle, good ROM, Rotator cuff intact,           Assessment & Plan:

## 2012-11-04 NOTE — Patient Instructions (Addendum)
Treating sinusitis with antibiotics Start allergy medication Start nasal saline Anti-inflammatories for your elbow Call if you do not improve F/U 4 monthsMedial Epicondylitis (Golfer's Elbow) with Rehab Medial epicondylitis involves inflammation and pain around the inner (medial) portion of the elbow. This pain is caused by inflammation of the tendons in the forearm that flex (bring down) the wrist. Medial epicondylitis is also called golfer's elbow, because it is common among golfers. However, it may occur in any individual who flexes the wrist regularly. If medial epicondylitis is left untreated, it may become a chronic problem. SYMPTOMS   Pain, tenderness, or inflammation over the inner (medial) side of the elbow.  Pain or weakness with gripping activities.  Pain that increases with wrist twisting motions (using a screwdriver, playing golf, bowling). CAUSES  Medial epicondylitis is caused by inflammation of the tendons that flex the wrist. Causes of injury may include:  Chronic, repetitive stress and strain to the tendons that run from the wrist and forearm to the elbow.  Sudden strain on the forearm, including wrist snap when serving balls with racquet sports, or throwing a baseball. RISK INCREASES WITH:  Sports or occupations that require repetitive and/or strenuous forearm and wrist movements (pitching a baseball, golfing, carpentry).  Poor wrist and forearm strength and flexibility.  Failure to warm up properly before activity.  Resuming activity before healing, rehabilitation, and conditioning are complete. PREVENTION   Warm up and stretch properly before activity.  Maintain physical fitness:  Strength, flexibility, and endurance.  Cardiovascular fitness.  Wear and use properly fitted equipment.  Learn and use proper technique and have a coach correct improper technique.  Wear a tennis elbow (counterforce) brace. PROGNOSIS  The course of this condition depends on  the degree of the injury. If treated properly, acute cases (symptoms lasting less than 4 weeks) are often resolved in 2 to 6 weeks. Chronic (longer lasting cases) often resolve in 3 to 6 months, but may require physical therapy. RELATED COMPLICATIONS   Frequently recurring symptoms, resulting in a chronic problem. Properly treating the problem the first time decreases frequency of recurrence.  Chronic inflammation, scarring, and partial tendon tear, requiring surgery.  Delayed healing or resolution of symptoms. TREATMENT  Treatment first involves the use of ice and medicine, to reduce pain and inflammation. Strengthening and stretching exercises may reduce discomfort, if performed regularly. These exercises may be performed at home, if the condition is an acute injury. Chronic cases may require a referral to a physical therapist for evaluation and treatment. Your caregiver may advise a corticosteroid injection to help reduce inflammation. Rarely, surgery is needed. MEDICATION  If pain medicine is needed, nonsteroidal anti-inflammatory medicines (aspirin and ibuprofen), or other minor pain relievers (acetaminophen), are often advised.  Do not take pain medicine for 7 days before surgery.  Prescription pain relievers may be given, if your caregiver thinks they are needed. Use only as directed and only as much as you need.  Corticosteroid injections may be recommended. These injections should be reserved only for the most severe cases, because they can only be given a certain number of times. HEAT AND COLD  Cold treatment (icing) should be applied for 10 to 15 minutes every 2 to 3 hours for inflammation and pain, and immediately after activity that aggravates your symptoms. Use ice packs or an ice massage.  Heat treatment may be used before performing stretching and strengthening activities prescribed by your caregiver, physical therapist, or athletic trainer. Use a heat pack or a warm water  soak. SEEK MEDICAL CARE IF: Symptoms get worse or do not improve in 2 weeks, despite treatment.

## 2012-11-04 NOTE — Assessment & Plan Note (Signed)
Nasal saline, allergy medication, antibiotics based on duration

## 2012-11-04 NOTE — Assessment & Plan Note (Signed)
Supportive care, throat lozenges

## 2012-11-04 NOTE — Assessment & Plan Note (Signed)
Trial of anti-inflammatories, ROM and ICE

## 2012-11-08 ENCOUNTER — Telehealth: Payer: Self-pay | Admitting: Family Medicine

## 2012-11-08 NOTE — Telephone Encounter (Signed)
Pt aware.

## 2012-11-08 NOTE — Telephone Encounter (Signed)
Patient was in for appt a few days ago and she is still having pain and pressure in her head and some nausea. Feeling feverish also. Started yesterday. Took tylenol and laid down earlier and it eased the pain but still not feeling well.

## 2012-11-08 NOTE — Telephone Encounter (Signed)
Tell her to continue the antibiotics I want her to add sudafed as directed on box for the next 3 days to help decongest Also take Tylenol or ibuprofen as needed for headache Continue all the other medications

## 2012-12-08 ENCOUNTER — Other Ambulatory Visit: Payer: Self-pay | Admitting: Gastroenterology

## 2012-12-13 ENCOUNTER — Telehealth: Payer: Self-pay | Admitting: Family Medicine

## 2012-12-13 MED ORDER — OMEPRAZOLE 20 MG PO CPDR: DELAYED_RELEASE_CAPSULE | ORAL | Status: DC

## 2012-12-13 NOTE — Telephone Encounter (Signed)
This has been sent

## 2013-01-03 ENCOUNTER — Other Ambulatory Visit: Payer: Self-pay | Admitting: Family Medicine

## 2013-01-16 ENCOUNTER — Other Ambulatory Visit (HOSPITAL_COMMUNITY): Payer: Self-pay | Admitting: Oncology

## 2013-01-16 DIAGNOSIS — Z853 Personal history of malignant neoplasm of breast: Secondary | ICD-10-CM

## 2013-01-18 ENCOUNTER — Ambulatory Visit (HOSPITAL_COMMUNITY)
Admission: RE | Admit: 2013-01-18 | Discharge: 2013-01-18 | Disposition: A | Discharge status: Home / Self Care | Payer: Medicare Other | Source: Ambulatory Visit | Attending: Oncology | Admitting: Oncology

## 2013-01-18 DIAGNOSIS — Z853 Personal history of malignant neoplasm of breast: Secondary | ICD-10-CM | POA: Insufficient documentation

## 2013-02-24 ENCOUNTER — Ambulatory Visit: Payer: Self-pay | Admitting: Adult Health

## 2013-03-06 ENCOUNTER — Ambulatory Visit (INDEPENDENT_AMBULATORY_CARE_PROVIDER_SITE_OTHER): Payer: Medicare Other | Admitting: Adult Health

## 2013-03-06 ENCOUNTER — Encounter: Payer: Self-pay | Admitting: Adult Health

## 2013-03-06 VITALS — BP 136/90 | Ht 63.0 in | Wt 255.0 lb

## 2013-03-06 DIAGNOSIS — E042 Nontoxic multinodular goiter: Secondary | ICD-10-CM

## 2013-03-06 DIAGNOSIS — N898 Other specified noninflammatory disorders of vagina: Secondary | ICD-10-CM

## 2013-03-06 DIAGNOSIS — E782 Mixed hyperlipidemia: Secondary | ICD-10-CM

## 2013-03-06 DIAGNOSIS — L293 Anogenital pruritus, unspecified: Secondary | ICD-10-CM

## 2013-03-06 DIAGNOSIS — B379 Candidiasis, unspecified: Secondary | ICD-10-CM

## 2013-03-06 DIAGNOSIS — I10 Essential (primary) hypertension: Secondary | ICD-10-CM

## 2013-03-06 LAB — CBC
HCT: 38 % (ref 36.0–46.0)
MCHC: 32.9 g/dL (ref 30.0–36.0)
Platelets: 281 10*3/uL (ref 150–400)
RDW: 14.6 % (ref 11.5–15.5)

## 2013-03-06 LAB — POCT WET PREP (WET MOUNT)

## 2013-03-06 LAB — COMPREHENSIVE METABOLIC PANEL
ALT: 11 U/L (ref 0–35)
AST: 12 U/L (ref 0–37)
Albumin: 4.1 g/dL (ref 3.5–5.2)
Alkaline Phosphatase: 75 U/L (ref 39–117)
BUN: 13 mg/dL (ref 6–23)
Creat: 1.02 mg/dL (ref 0.50–1.10)
Potassium: 3.9 mEq/L (ref 3.5–5.3)

## 2013-03-06 LAB — LIPID PANEL
HDL: 45 mg/dL (ref >39)
LDL Cholesterol: 146 mg/dL — ABNORMAL HIGH (ref 0–99)
Triglycerides: 117 mg/dL (ref <150)
VLDL: 23 mg/dL (ref 0–40)

## 2013-03-06 MED ORDER — FLUCONAZOLE 150 MG PO TABS: 150.0000 mg | ORAL_TABLET | Freq: Once | ORAL | Status: DC

## 2013-03-06 NOTE — Progress Notes (Signed)
Subjective:     Patient ID: Gina Solis, female   DOB: May 08, 1961, 52 y.o.   MRN: [id number]  HPI Gina Solis is in today complaining of vaginal itch, she has been on antibiotics for a sinus infection and she said she has had 2 failed breast reconstructions,(history of breast cancer) and has chest discomfort,burns and pressure, and has not taken BP meds in 2 days. Says she wants labs, has not eaten today.  Review of Systems Positives in HPI Reviewed past medical,surgical, social and family history. Reviewed medications and allergies.      Objective:   Physical Exam BP 136/90  Ht 5\' 3"  (1.6 m)  Wt 255 lb (115.667 kg)  BMI 45.18 kg/m2   Skin warm and dry. Neck: mid line trachea, enlarge thyroid, right >left history of known nodules,Lungs: clear to ausculation bilaterally. Cardiovascular: regular rate and rhythm.she has point tenderness on chest wall, where scars are.Pelvic: external genitalia is normal in appearance, vagina: white discharge without odor, cervix and uterus are absent, adnexa: no masses or tenderness noted. Wet prep Positive for yeast Assessment:    Vaginal itch   Yeast Thyroid nodules Hypertension History elevated cholesterol  Plan:     Rx diflucan 150 mg 1 now with 1 refill Check CBC,CMP,TSH and lipids Make appt for physical in 4 weeks Follow up labs in 48 hours   If has chest pain go to ER Go home and take BP meds

## 2013-03-06 NOTE — Patient Instructions (Addendum)
If has chest pain go to ER Follow up labs in 48 hours physicalMonilial Vaginitis Vaginitis in a soreness, swelling and redness (inflammation) of the vagina and vulva. Monilial vaginitis is not a sexually transmitted infection. CAUSES  Yeast vaginitis is caused by yeast (candida) that is normally found in your vagina. With a yeast infection, the candida has overgrown in number to a point that upsets the chemical balance. SYMPTOMS   White, thick vaginal discharge.  Swelling, itching, redness and irritation of the vagina and possibly the lips of the vagina (vulva).  Burning or painful urination.  Painful intercourse. DIAGNOSIS  Things that may contribute to monilial vaginitis are:  Postmenopausal and virginal states.  Pregnancy.  Infections.  Being tired, sick or stressed, especially if you had monilial vaginitis in the past.  Diabetes. Good control will help lower the chance.  Birth control pills.  Tight fitting garments.  Using bubble bath, feminine sprays, douches or deodorant tampons.  Taking certain medications that kill germs (antibiotics).  Sporadic recurrence can occur if you become ill. TREATMENT  Your caregiver will give you medication.  There are several kinds of anti monilial vaginal creams and suppositories specific for monilial vaginitis. For recurrent yeast infections, use a suppository or cream in the vagina 2 times a week, or as directed.  Anti-monilial or steroid cream for the itching or irritation of the vulva may also be used. Get your caregiver's permission.  Painting the vagina with methylene blue solution may help if the monilial cream does not work.  Eating yogurt may help prevent monilial vaginitis. HOME CARE INSTRUCTIONS   Finish all medication as prescribed.  Do not have sex until treatment is completed or after your caregiver tells you it is okay.  Take warm sitz baths.  Do not douche.  Do not use tampons, especially scented  ones.  Wear cotton underwear.  Avoid tight pants and panty hose.  Tell your sexual partner that you have a yeast infection. They should go to their caregiver if they have symptoms such as mild rash or itching.  Your sexual partner should be treated as well if your infection is difficult to eliminate.  Practice safer sex. Use condoms.  Some vaginal medications cause latex condoms to fail. Vaginal medications that harm condoms are:  Cleocin cream.  Butoconazole (Femstat).  Terconazole (Terazol) vaginal suppository.  Miconazole (Monistat) (may be purchased over the counter). SEEK MEDICAL CARE IF:   You have a temperature by mouth above 102 F (38.9 C).  The infection is getting worse after 2 days of treatment.  The infection is not getting better after 3 days of treatment.  You develop blisters in or around your vagina.  You develop vaginal bleeding, and it is not your menstrual period.  You have pain when you urinate.  You develop intestinal problems.  You have pain with sexual intercourse. Document Released: 04/22/2005 Document Revised: 10/05/2011 Document Reviewed: 01/04/2009 [facility] Patient Information 2014 [place], [place].  in 4 weeks

## 2013-03-08 ENCOUNTER — Telehealth: Payer: Self-pay | Admitting: Adult Health

## 2013-03-08 NOTE — Telephone Encounter (Signed)
Left message to increase activity and take krell oil

## 2013-04-03 ENCOUNTER — Other Ambulatory Visit: Payer: Self-pay | Admitting: Adult Health

## 2013-04-18 ENCOUNTER — Other Ambulatory Visit: Payer: Medicare Other | Admitting: Adult Health

## 2013-04-20 ENCOUNTER — Telehealth: Payer: Self-pay | Admitting: Family Medicine

## 2013-04-20 NOTE — Telephone Encounter (Signed)
Pt needs a letter stating that she has to use her breathing machine because she gets out of breath easily to turn into social services. Pt has an appt Monday with you and would like to pick up then.

## 2013-04-21 NOTE — Telephone Encounter (Signed)
Discuss at OV, has no history of COPD/Asthma documented

## 2013-04-24 ENCOUNTER — Other Ambulatory Visit: Payer: Self-pay | Admitting: Family Medicine

## 2013-04-24 ENCOUNTER — Encounter: Payer: Self-pay | Admitting: Family Medicine

## 2013-04-24 ENCOUNTER — Ambulatory Visit (HOSPITAL_COMMUNITY)
Admission: RE | Admit: 2013-04-24 | Discharge: 2013-04-24 | Disposition: A | Discharge status: Home / Self Care | Payer: Medicare Other | Source: Ambulatory Visit | Attending: Family Medicine | Admitting: Family Medicine

## 2013-04-24 ENCOUNTER — Ambulatory Visit (INDEPENDENT_AMBULATORY_CARE_PROVIDER_SITE_OTHER): Payer: Medicare Other | Admitting: Family Medicine

## 2013-04-24 VITALS — BP 144/90 | HR 60 | Temp 97.0°F | Resp 16 | Ht 63.0 in | Wt 254.0 lb

## 2013-04-24 DIAGNOSIS — E785 Hyperlipidemia, unspecified: Secondary | ICD-10-CM

## 2013-04-24 DIAGNOSIS — R600 Localized edema: Secondary | ICD-10-CM

## 2013-04-24 DIAGNOSIS — R0602 Shortness of breath: Secondary | ICD-10-CM

## 2013-04-24 DIAGNOSIS — I10 Essential (primary) hypertension: Secondary | ICD-10-CM

## 2013-04-24 DIAGNOSIS — Z853 Personal history of malignant neoplasm of breast: Secondary | ICD-10-CM

## 2013-04-24 DIAGNOSIS — R9431 Abnormal electrocardiogram [ECG] [EKG]: Secondary | ICD-10-CM

## 2013-04-24 DIAGNOSIS — R911 Solitary pulmonary nodule: Secondary | ICD-10-CM

## 2013-04-24 DIAGNOSIS — R609 Edema, unspecified: Secondary | ICD-10-CM

## 2013-04-24 LAB — CBC WITH DIFFERENTIAL/PLATELET
Basophils Absolute: 0 10*3/uL (ref 0.0–0.1)
Basophils Relative: 0 % (ref 0–1)
Eosinophils Absolute: 0.2 10*3/uL (ref 0.0–0.7)
MCH: 27.8 pg (ref 26.0–34.0)
MCHC: 33.1 g/dL (ref 30.0–36.0)
Neutrophils Relative %: 52 % (ref 43–77)
Platelets: 271 10*3/uL (ref 150–400)
RDW: 14.8 % (ref 11.5–15.5)

## 2013-04-24 LAB — MAGNESIUM: Magnesium: 1.9 mg/dL (ref 1.5–2.5)

## 2013-04-24 LAB — BASIC METABOLIC PANEL
Calcium: 9.6 mg/dL (ref 8.4–10.5)
Glucose, Bld: 98 mg/dL (ref 70–99)
Sodium: 139 mEq/L (ref 135–145)

## 2013-04-24 MED ORDER — HYDROCHLOROTHIAZIDE 25 MG PO TABS: 25.0000 mg | ORAL_TABLET | Freq: Every day | ORAL | Status: DC

## 2013-04-24 NOTE — Progress Notes (Signed)
  Subjective:    Patient ID: Gina Solis, female    DOB: 03/09/1961, 52 y.o.   MRN: [id number]  HPI  Pt here with SOB for the past 2 months, worse with walking around home or going up stairs. Also admits to mild cough with some production past 2 weeks. Has not taken HCTZ not sure if she has 12.5 or 25mg . States does not take when she does not feel well. Denies chest pain but has soreness in across chest, mostly in area of previous breast surgery. Back pain, pain x 24 hours, was at church and felt a catch, has had similar pain before. Denies change in bowel or bladder. Took ultram last night which helped.   Labs from GYN reviewed  Review of Systems  GEN- denies fatigue, fever, weight loss,weakness, recent illness HEENT- denies eye drainage, change in vision, nasal discharge, CVS- denies chest pain, palpitations RESP- + SOB, cough, wheeze ABD- denies N/V, change in stools, abd pain GU- denies dysuria, hematuria, dribbling, incontinence MSK- +joint pain, muscle aches, injury Neuro- denies headache, dizziness, syncope, seizure activity      Objective:   Physical Exam  GEN- NAD, alert and oriented x3, obese HEENT- PERRL, EOMI, non injected sclera, pink conjunctiva, MMM, oropharynx clear, mild sinus tenderness Neck- Supple, no thyromegaly, no JVD CVS- tachycardia, no murmur RESP-CTAB, good air movement, no wheezing ABD-NABS,soft,NT,ND Back- mild TTP lumbar spine, +paraspinal tenderness, left side, neg SLR, decreased ROM  EXT- +pedal edema Pulses- Radial, DP- 2+  EKG- NSR, prolonged QTC 482, no ST changes     Assessment & Plan:

## 2013-04-24 NOTE — Assessment & Plan Note (Signed)
Refer to cardiology, mag level, lytes, TSH normal No other new meds, does not take HCTZ on regular basis Risk factors are HTN, morbid obesity, mild hyperlipidemia

## 2013-04-24 NOTE — Assessment & Plan Note (Signed)
Reminded importance of BP control restart HCTZ Labs today

## 2013-04-24 NOTE — Patient Instructions (Signed)
[facility] - Heart care Referral  Get labs done today  Get the CXR done today  Take the HCTZ 25mg  once a day-  F/U 4 weeks

## 2013-04-24 NOTE — Assessment & Plan Note (Signed)
EKG showed prolonged QT otherwise normal Normal sat, Check BNP, labs, CXR Likley MTF

## 2013-04-24 NOTE — Assessment & Plan Note (Signed)
TC and LDL elevated, needs weight loss and change in diet Cardiac work-up pending, will wait on statin drug at this time

## 2013-04-28 ENCOUNTER — Ambulatory Visit (HOSPITAL_COMMUNITY)
Admission: RE | Admit: 2013-04-28 | Discharge: 2013-04-28 | Disposition: A | Discharge status: Home / Self Care | Payer: Medicare Other | Source: Ambulatory Visit | Attending: Family Medicine | Admitting: Family Medicine

## 2013-04-28 ENCOUNTER — Ambulatory Visit (HOSPITAL_COMMUNITY): Payer: Medicare Other

## 2013-04-28 DIAGNOSIS — R911 Solitary pulmonary nodule: Secondary | ICD-10-CM

## 2013-04-28 DIAGNOSIS — E041 Nontoxic single thyroid nodule: Secondary | ICD-10-CM | POA: Insufficient documentation

## 2013-04-28 DIAGNOSIS — D3500 Benign neoplasm of unspecified adrenal gland: Secondary | ICD-10-CM | POA: Insufficient documentation

## 2013-04-28 DIAGNOSIS — R918 Other nonspecific abnormal finding of lung field: Secondary | ICD-10-CM | POA: Insufficient documentation

## 2013-04-28 DIAGNOSIS — Z853 Personal history of malignant neoplasm of breast: Secondary | ICD-10-CM

## 2013-04-28 MED ORDER — IOHEXOL 300 MG/ML  SOLN
80.0000 mL | Freq: Once | INTRAMUSCULAR | Status: AC | PRN
  Administered 2013-04-28: 80 mL via INTRAVENOUS

## 2013-05-11 ENCOUNTER — Telehealth: Payer: Self-pay

## 2013-05-11 MED ORDER — LUBIPROSTONE 24 MCG PO CAPS: 24.0000 ug | ORAL_CAPSULE | Freq: Two times a day (BID) | ORAL | Status: DC

## 2013-05-11 NOTE — Telephone Encounter (Signed)
Done

## 2013-05-11 NOTE — Telephone Encounter (Signed)
Pt came by and requested samples of Amitiza 24 mcg. I gave her # 12 to take one bid with food. She would like a refill sent to [facility] in [place].

## 2013-05-17 ENCOUNTER — Encounter: Payer: Self-pay | Admitting: *Deleted

## 2013-05-17 ENCOUNTER — Encounter: Payer: Self-pay | Admitting: Cardiology

## 2013-05-17 ENCOUNTER — Encounter: Payer: Medicare Other | Admitting: Cardiology

## 2013-05-17 NOTE — Progress Notes (Signed)
No show  This encounter was created in error - please disregard.

## 2013-05-22 ENCOUNTER — Ambulatory Visit: Payer: Medicare Other | Admitting: Family Medicine

## 2013-06-07 ENCOUNTER — Encounter: Payer: Medicare Other | Admitting: Cardiology

## 2013-06-07 ENCOUNTER — Encounter: Payer: Self-pay | Admitting: Cardiology

## 2013-06-07 NOTE — Progress Notes (Signed)
Patient canceled visit. This encounter was created in error - please disregard. 

## 2013-06-08 ENCOUNTER — Other Ambulatory Visit: Payer: Medicare Other | Admitting: Adult Health

## 2013-06-16 ENCOUNTER — Ambulatory Visit (INDEPENDENT_AMBULATORY_CARE_PROVIDER_SITE_OTHER): Payer: Medicare Other | Admitting: Cardiology

## 2013-06-16 ENCOUNTER — Encounter: Payer: Self-pay | Admitting: Cardiology

## 2013-06-16 VITALS — BP 158/89 | HR 93 | Ht 63.0 in | Wt 254.0 lb

## 2013-06-16 DIAGNOSIS — I10 Essential (primary) hypertension: Secondary | ICD-10-CM

## 2013-06-16 DIAGNOSIS — E785 Hyperlipidemia, unspecified: Secondary | ICD-10-CM

## 2013-06-16 DIAGNOSIS — R0602 Shortness of breath: Secondary | ICD-10-CM

## 2013-06-16 DIAGNOSIS — Z853 Personal history of malignant neoplasm of breast: Secondary | ICD-10-CM

## 2013-06-16 NOTE — Assessment & Plan Note (Signed)
Infiltrating ductal carcinoma, stage IIIB, status post chemotherapy, radiation, and surgery as outlined.

## 2013-06-16 NOTE — Assessment & Plan Note (Signed)
At this time managed by diet. Keep follow up with Dr. Jeanice Lim.

## 2013-06-16 NOTE — Progress Notes (Signed)
Clinical Summary Gina Solis is a 52 y.o.female referred for cardiology consultation by Dr. Jeanice Lim. Records indicate dyspnea on exertion. She reports NYHA class 2-3 dyspnea and exertion since August. No exertional chest pain or palpitations. Feels a general discomfort on the left side of her chest sometimes when she lies down. She has a history of left-sided breast cancer, infiltrating ductal carcinoma, stage IIIB, underwent both radiation and chemotherapy (FEC) as well as surgery. She has had subsequent reconstructions on the left. She had no evidence of decline in LVEF by MUGA scans at that time.  Recent lab work reviewed finding BNP 20.7, hemoglobin 12.8, platelets 271, potassium 4.4, BUN 12, creatinine 1.1. Chest CT in September ordered by Dr. Jeanice Lim reported a small new left upper lobe pulmonary nodule, nodule within the right lobe of the thyroid gland, and previously noted nodule in the right lung.  Last MUGA scan in 2008 reported LVEF 73%  She has undergone no recent cardiac structural or ischemic workup. ECG reviewed showing sinus rhythm with increased voltage and nonspecific ST changes. Cardiac risk factors include hypertension and hyperlipidemia. Patient states her mother had heart failure in her late 54s.  Allergies  Allergen Reactions  . Codeine Nausea Only    Current Outpatient Prescriptions  Medication Sig Dispense Refill  . acetaminophen (TYLENOL) 500 MG tablet Take 1,000 mg by mouth every 6 (six) hours as needed. Pain      . diclofenac (VOLTAREN) 75 MG EC tablet TAKE 1 TABLET TWICE A DAY  60 tablet  1  . hydrochlorothiazide (HYDRODIURIL) 25 MG tablet Take 1 tablet (25 mg total) by mouth daily.  30 tablet  3  . lubiprostone (AMITIZA) 24 MCG capsule Take 1 capsule (24 mcg total) by mouth 2 (two) times daily with a meal.  60 capsule  3  . omeprazole (PRILOSEC) 20 MG capsule TAKE 1 TABLET BY MOUTH TWICE A DAY WITH A MEAL  60 capsule  5  . traMADol (ULTRAM) 50 MG tablet Take 50 mg  by mouth every 6 (six) hours as needed. Pain.       No current facility-administered medications for this visit.    Past Medical History  Diagnosis Date  . Breast cancer 2008    Stage IIIB  . Dermatomyositis   . Essential hypertension, benign   . Hypercholesteremia   . Arthritis   . GERD (gastroesophageal reflux disease)   . Lymphedema of arm     Left  . Multiple thyroid nodules   . 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 Abdominal hysterectomy    . Tubal ligation  1985  . Mastectomy, partial  2008    Left  . Breast reconstruction  left    X 2, trying again in spring.   . Esophagogastroduodenoscopy  05/02/2007    WUJ:WJXBJ hiatal hernia, otherwise normal stomach./Normal duodenal bulb and second portion of the duodenum/White plaques seen from the proximal to the distal esophagus/ Shallow ulcerations with mild erythema seen scattered throughout the esophagus most pronounced in the distal esophagus.  HSV1  . Colonoscopy with esophagogastroduodenoscopy (egd)  07/15/2012    Procedure: COLONOSCOPY WITH ESOPHAGOGASTRODUODENOSCOPY (EGD);  Surgeon: West Bali, MD;  Location: [facility];  Service: Endoscopy;  Laterality: N/A;  9:30    Family History  Problem Relation Age of Onset  . Breast cancer Paternal Aunt   . Colon cancer Maternal Uncle     age  60s  . Kidney failure Mother   . Heart failure Mother   . Colon cancer Paternal Grandfather     ???    Social History Ms. Hazell reports that she has quit smoking. Her smoking use included Cigarettes. She smoked 0.50 packs per day. She has never used smokeless tobacco. Ms. Rockhold reports that she does not drink alcohol.  Review of Systems Chronic ankle discomfort with degenerative disease, patient states she is disabled. He is able to walk and does exercise as tolerated. No cough or hemoptysis. Chronic leg edema that is unchanged. No orthopnea or PND.  Otherwise negative  Physical Examination Filed Vitals:   06/16/13 1352  BP: 158/89  Pulse: 93   Filed Weights   06/16/13 1352  Weight: 254 lb (115.214 kg)   Obese woman, appears comfortable at rest. HEENT: Conjunctiva and lids normal, oropharynx clear. Neck: Supple, no elevated JVP or carotid bruits, no thyromegaly. Lungs: Clear to auscultation, nonlabored breathing at rest. Cardiac: Regular rate and rhythm, no S3 or significant systolic murmur, no pericardial rub. Abdomen: Soft, nontender, bowel sounds present. Extremities: Trace edema, distal pulses 2+. Skin: Warm and dry. Musculoskeletal: No kyphosis. Neuropsychiatric: Alert and oriented x3, affect grossly appropriate.   Problem List and Plan   SOB (shortness of breath) on exertion Outlined above, no clearly associated chest pain, has a vague heaviness in the left side of her chest sometimes when she lies down. ECG is nonspecific. Has prior history of breast cancer with radiation and chemotherapy as well as reconstructive surgery as noted above. Plan will be to obtain an echocardiogram to followup on cardiac structure and function, also an exercise Cardiolite for ischemic evaluation. We will plan to inform her of the results.  HYPERTENSION Blood pressure is elevated today. Patient reports compliance with her medications. Keep follow up with Dr. Jeanice Lim and she may need further titration over diuretic.  HYPERLIPIDEMIA At this time managed by diet. Keep follow up with Dr. Jeanice Lim.  BREAST CANCER, HX OF Infiltrating ductal carcinoma, stage IIIB, status post chemotherapy, radiation, and surgery as outlined.    Jonelle Sidle, M.D., F.A.C.C.

## 2013-06-16 NOTE — Assessment & Plan Note (Signed)
Outlined above, no clearly associated chest pain, has a vague heaviness in the left side of her chest sometimes when she lies down. ECG is nonspecific. Has prior history of breast cancer with radiation and chemotherapy as well as reconstructive surgery as noted above. Plan will be to obtain an echocardiogram to followup on cardiac structure and function, also an exercise Cardiolite for ischemic evaluation. We will plan to inform her of the results.

## 2013-06-16 NOTE — Patient Instructions (Addendum)
Your physician recommends that you schedule a follow-up appointment  If needed after tests complete.We will call you with results    .Your physician has requested that you have en exercise stress myoview. For further information please visit [email or web address] Please follow instruction sheet, as given.

## 2013-06-16 NOTE — Assessment & Plan Note (Signed)
Blood pressure is elevated today. Patient reports compliance with her medications. Keep follow up with Dr. Jeanice Lim and she may need further titration over diuretic.

## 2013-06-19 NOTE — Addendum Note (Signed)
Addended by: Thompson Grayer on: 06/19/2013 05:14 PM   Modules accepted: Orders

## 2013-07-04 ENCOUNTER — Encounter (HOSPITAL_COMMUNITY): Payer: Medicare Other

## 2013-07-04 ENCOUNTER — Inpatient Hospital Stay (HOSPITAL_COMMUNITY)
Admission: RE | Admit: 2013-07-04 | Discharge: 2013-07-04 | Disposition: A | Discharge status: Home / Self Care | Payer: Medicare Other | Source: Ambulatory Visit | Attending: Cardiology | Admitting: Cardiology

## 2013-07-04 ENCOUNTER — Ambulatory Visit (HOSPITAL_COMMUNITY): Payer: Medicare Other

## 2013-07-04 DIAGNOSIS — R0602 Shortness of breath: Secondary | ICD-10-CM

## 2013-07-14 ENCOUNTER — Encounter (HOSPITAL_COMMUNITY): Payer: Medicare Other

## 2013-09-08 ENCOUNTER — Emergency Department (HOSPITAL_COMMUNITY): Payer: Medicare Other

## 2013-09-08 ENCOUNTER — Encounter (HOSPITAL_COMMUNITY): Payer: Self-pay | Admitting: Emergency Medicine

## 2013-09-08 ENCOUNTER — Emergency Department (HOSPITAL_COMMUNITY)
Admission: EM | Admit: 2013-09-08 | Discharge: 2013-09-08 | Disposition: A | Discharge status: Home / Self Care | Payer: Medicare Other | Attending: Emergency Medicine | Admitting: Emergency Medicine

## 2013-09-08 DIAGNOSIS — M129 Arthropathy, unspecified: Secondary | ICD-10-CM | POA: Insufficient documentation

## 2013-09-08 DIAGNOSIS — R071 Chest pain on breathing: Secondary | ICD-10-CM | POA: Insufficient documentation

## 2013-09-08 DIAGNOSIS — Z87891 Personal history of nicotine dependence: Secondary | ICD-10-CM | POA: Insufficient documentation

## 2013-09-08 DIAGNOSIS — R6884 Jaw pain: Secondary | ICD-10-CM | POA: Insufficient documentation

## 2013-09-08 DIAGNOSIS — Z8639 Personal history of other endocrine, nutritional and metabolic disease: Secondary | ICD-10-CM | POA: Insufficient documentation

## 2013-09-08 DIAGNOSIS — Z8619 Personal history of other infectious and parasitic diseases: Secondary | ICD-10-CM | POA: Insufficient documentation

## 2013-09-08 DIAGNOSIS — K219 Gastro-esophageal reflux disease without esophagitis: Secondary | ICD-10-CM | POA: Insufficient documentation

## 2013-09-08 DIAGNOSIS — Z79899 Other long term (current) drug therapy: Secondary | ICD-10-CM | POA: Insufficient documentation

## 2013-09-08 DIAGNOSIS — R079 Chest pain, unspecified: Secondary | ICD-10-CM

## 2013-09-08 DIAGNOSIS — I10 Essential (primary) hypertension: Secondary | ICD-10-CM | POA: Insufficient documentation

## 2013-09-08 DIAGNOSIS — Z862 Personal history of diseases of the blood and blood-forming organs and certain disorders involving the immune mechanism: Secondary | ICD-10-CM | POA: Insufficient documentation

## 2013-09-08 DIAGNOSIS — Z853 Personal history of malignant neoplasm of breast: Secondary | ICD-10-CM | POA: Insufficient documentation

## 2013-09-08 DIAGNOSIS — H9209 Otalgia, unspecified ear: Secondary | ICD-10-CM | POA: Insufficient documentation

## 2013-09-08 LAB — CBC WITH DIFFERENTIAL/PLATELET
Basophils Absolute: 0 10*3/uL (ref 0.0–0.1)
Basophils Relative: 1 % (ref 0–1)
Eosinophils Absolute: 0.2 10*3/uL (ref 0.0–0.7)
Eosinophils Relative: 3 % (ref 0–5)
HCT: 39.2 % (ref 36.0–46.0)
Hemoglobin: 12.8 g/dL (ref 12.0–15.0)
Lymphocytes Relative: 40 % (ref 12–46)
Lymphs Abs: 2.5 10*3/uL (ref 0.7–4.0)
MCH: 28.1 pg (ref 26.0–34.0)
MCHC: 32.7 g/dL (ref 30.0–36.0)
MCV: 86.2 fL (ref 78.0–100.0)
Monocytes Absolute: 0.2 10*3/uL (ref 0.1–1.0)
Monocytes Relative: 4 % (ref 3–12)
Neutro Abs: 3.2 10*3/uL (ref 1.7–7.7)
Neutrophils Relative %: 53 % (ref 43–77)
Platelets: 257 10*3/uL (ref 150–400)
RBC: 4.55 MIL/uL (ref 3.87–5.11)
RDW: 14.4 % (ref 11.5–15.5)
WBC: 6.1 10*3/uL (ref 4.0–10.5)

## 2013-09-08 LAB — BASIC METABOLIC PANEL
BUN: 14 mg/dL (ref 6–23)
CO2: 25 mEq/L (ref 19–32)
Calcium: 9.2 mg/dL (ref 8.4–10.5)
Chloride: 101 mEq/L (ref 96–112)
Creatinine, Ser: 1.04 mg/dL (ref 0.50–1.10)
GFR calc Af Amer: 70 mL/min — ABNORMAL LOW (ref >90)
GFR calc non Af Amer: 61 mL/min — ABNORMAL LOW (ref >90)
Glucose, Bld: 98 mg/dL (ref 70–99)
Potassium: 3.5 mEq/L — ABNORMAL LOW (ref 3.7–5.3)
Sodium: 138 mEq/L (ref 137–147)

## 2013-09-08 LAB — TROPONIN I: Troponin I: <0.3 ng/mL (ref <0.30)

## 2013-09-08 MED ORDER — IBUPROFEN 400 MG PO TABS
600.0000 mg | ORAL_TABLET | Freq: Once | ORAL | Status: AC
Start: 2013-09-08 — End: 2013-09-08
  Administered 2013-09-08: 600 mg via ORAL
  Filled 2013-09-08: qty 2

## 2013-09-08 MED ORDER — ASPIRIN 81 MG PO CHEW
324.0000 mg | CHEWABLE_TABLET | Freq: Once | ORAL | Status: AC
  Administered 2013-09-08: 324 mg via ORAL
  Filled 2013-09-08: qty 4

## 2013-09-08 MED ORDER — OXYCODONE-ACETAMINOPHEN 5-325 MG PO TABS: 1.0000 | ORAL_TABLET | ORAL | Status: DC | PRN

## 2013-09-08 NOTE — ED Notes (Signed)
Patient c/o left side chest pain radiating into left arm that started on Wednesday and has progressively gotten worse. Patient reports headache , shortness of breath, and weakness. Patient reports productive cough with thick clear sputum.

## 2013-09-08 NOTE — ED Provider Notes (Signed)
CSN: [id number]     Arrival date & time 09/08/13  4259 History  This chart was scribed for Gina Manifold, MD by Ludger Nutting, ED Scribe. This patient was seen in room [facility]10/[facility]10 and the patient's care was started 8:40 AM.    Chief Complaint  Patient presents with  . Chest Pain      The history is provided by the patient. No language interpreter was used.    HPI Comments: Gina Solis is a 53 y.o. female with past medical history of hypercholesterolemia and breast cancer who presents to the Emergency Department complaining of 2 episodes of left sided chest pain. She states the first episode occurred 2 days ago while she was getting ready for bed. She describes the pain as sharp pain and states it lasted a few hours before it resolved. She states the second episode occurred today and is described as burning. She reports having associated pain to the left jaw and left ear. She also has radiation to the left shoulder and upper arm. She states the pain radiated to the entire left arm which lasted for a few minutes. She states the chest pain currently is mild and described as tightness. She states the pain is worse with deep breathing. She denies leg pain or swelling, SOB, nausea, diaphoresis.    Past Medical History  Diagnosis Date  . Breast cancer 2008    Stage IIIB  . Dermatomyositis   . Essential hypertension, benign   . Hypercholesteremia   . Arthritis   . GERD (gastroesophageal reflux disease)   . Lymphedema of arm     Left  . Multiple thyroid nodules   . 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 Abdominal hysterectomy    . Tubal ligation  1985  . Mastectomy, partial  2008    Left  . Breast reconstruction  left    X 2, trying again in spring.   . Esophagogastroduodenoscopy  05/02/2007    DGL:OVFIE hiatal hernia, otherwise normal stomach./Normal duodenal bulb and second portion of the duodenum/White plaques  seen from the proximal to the distal esophagus/ Shallow ulcerations with mild erythema seen scattered throughout the esophagus most pronounced in the distal esophagus.  HSV1  . Colonoscopy with esophagogastroduodenoscopy (egd)  07/15/2012    Procedure: COLONOSCOPY WITH ESOPHAGOGASTRODUODENOSCOPY (EGD);  Surgeon: [name] Binder, MD;  Location: [facility];  Service: Endoscopy;  Laterality: N/A;  9:30   Family History  Problem Relation Age of Onset  . Breast cancer Paternal Aunt   . Colon cancer Maternal Uncle     age 20s  . Kidney failure Mother   . Heart failure Mother   . Colon cancer Paternal Grandfather     ???   History  Substance Use Topics  . Smoking status: Former Smoker -- 0.50 packs/day for 2 years    Types: Cigarettes    Quit date: 07/27/2002  . Smokeless tobacco: Never Used     Comment: quit years ago  . Alcohol Use: No   OB History   Grav Para Term Preterm Abortions TAB SAB Ect Mult Living   3 3        2      Review of Systems  Constitutional: Negative for diaphoresis.  HENT: Positive for ear pain (left).   Respiratory: Negative for shortness of breath.   Cardiovascular: Positive for chest pain. Negative for leg swelling.  Gastrointestinal: Negative for nausea.  Neurological: Negative for weakness.  All other systems reviewed and are negative.      Allergies  Codeine  Home Medications   Current Outpatient Rx  Name  Route  Sig  Dispense  Refill  . acetaminophen (TYLENOL) 500 MG tablet   Oral   Take 1,000 mg by mouth every 6 (six) hours as needed. Pain         . diclofenac (VOLTAREN) 75 MG EC tablet      TAKE 1 TABLET TWICE A DAY   60 tablet   1   . hydrochlorothiazide (HYDRODIURIL) 25 MG tablet   Oral   Take 1 tablet (25 mg total) by mouth daily.   30 tablet   3   . lubiprostone (AMITIZA) 24 MCG capsule   Oral   Take 1 capsule (24 mcg total) by mouth 2 (two) times daily with a meal.   60 capsule   3   . omeprazole (PRILOSEC) 20 MG  capsule      TAKE 1 TABLET BY MOUTH TWICE A DAY WITH A MEAL   60 capsule   5   . traMADol (ULTRAM) 50 MG tablet   Oral   Take 50 mg by mouth every 6 (six) hours as needed. Pain.          BP 138/77  Pulse 81  Temp(Src) 97.7 F (36.5 C) (Oral)  Ht 5\' 3"  (1.6 m)  Wt 253 lb (114.76 kg)  BMI 44.83 kg/m2  SpO2 97% Physical Exam  Nursing note and vitals reviewed. Constitutional: She is oriented to person, place, and time. She appears well-developed and well-nourished.  HENT:  Head: Normocephalic.  Left Ear: Tympanic membrane, external ear and ear canal normal.  Eyes: EOM are normal.  Neck: Normal range of motion.  Cardiovascular: Normal rate, regular rhythm and normal heart sounds.   Pulmonary/Chest: Effort normal and breath sounds normal. She exhibits tenderness.  Deformity to the left anterior chest wall. Mild local tenderness.   Abdominal: She exhibits no distension.  Musculoskeletal: Normal range of motion. She exhibits no tenderness.  No calf tenderness.   Neurological: She is alert and oriented to person, place, and time.  Psychiatric: She has a normal mood and affect.    ED Course  Procedures (including critical care time)  DIAGNOSTIC STUDIES: Oxygen Saturation is 97% on RA, adequate by my interpretation.    COORDINATION OF CARE: 8:50 AM Discussed treatment plan with pt at bedside and pt agreed to plan.   Labs Review Labs Reviewed  BASIC METABOLIC PANEL - Abnormal; Notable for the following:    Potassium 3.5 (*)    GFR calc non Af Amer 61 (*)    GFR calc Af Amer 70 (*)    All other components within normal limits  CBC WITH DIFFERENTIAL  TROPONIN I   Imaging Review Dg Chest 2 View  09/08/2013   CLINICAL DATA:  Left-sided chest pain and left upper extremity numbness over the past 2 days. Shortness of breath. Productive cough. Left earache.  EXAM: CHEST  2 VIEW  COMPARISON:  CT CHEST W/CM dated 04/28/2013; DG CHEST 2 VIEW dated 04/24/2013; CT ABD/PELV WO CM  dated 04/27/2012; DG CHEST 1V PORT dated 08/10/2010  FINDINGS: Cardiomediastinal silhouette unremarkable and unchanged. Lungs clear. Bronchovascular markings normal. Pulmonary vascularity normal. No visible pleural effusions. No pneumothorax. Left upper lobe nodule identified on prior CT not visualized on the chest x-ray. Prior left mastectomy and axillary node dissection. Visualized bony thorax intact.  IMPRESSION: No acute  cardiopulmonary disease.   Electronically Signed   By: Evangeline Dakin M.D.   On: 09/08/2013 09:26    EKG Interpretation    Date/Time:  Friday September 08 2013 08:35:00 EST Ventricular Rate:  87 PR Interval:  146 QRS Duration: 84 QT Interval:  410 QTC Calculation: 493 R Axis:   29 Text Interpretation:  Normal sinus rhythm Prolonged QT Left atrial enlargement When compared with ECG of 04-May-2010 01:35, No significant change was found Confirmed by Wilson Singer  MD, Kaytelyn Glore ([id number]) on 09/08/2013 9:41:39 AM            MDM   Final diagnoses:  Chest pain    53 year old female with left-sided chest pain. Somewhat atypical. There is a pruritic nature. Chest x-ray is clear. Afebrile. Doubt pulmonary embolism. EKG is unchanged. Troponin normal. Return precautions discussed. Outpatient followup.  I personally preformed the services scribed in my presence. The recorded information has been reviewed is accurate. Gina Manifold, MD.   Gina Manifold, MD 09/12/13 [phone]

## 2013-09-08 NOTE — Discharge Instructions (Signed)
Chest Pain (Nonspecific) °It is often hard to give a specific diagnosis for the cause of chest pain. There is always a chance that your pain could be related to something serious, such as a heart attack or a blood clot in the lungs. You need to follow up with your caregiver for further evaluation. °CAUSES  °· Heartburn. °· Pneumonia or bronchitis. °· Anxiety or stress. °· Inflammation around your heart (pericarditis) or lung (pleuritis or pleurisy). °· A blood clot in the lung. °· A collapsed lung (pneumothorax). It can develop suddenly on its own (spontaneous pneumothorax) or from injury (trauma) to the chest. °· Shingles infection (herpes zoster virus). °The chest wall is composed of bones, muscles, and cartilage. Any of these can be the source of the pain. °· The bones can be bruised by injury. °· The muscles or cartilage can be strained by coughing or overwork. °· The cartilage can be affected by inflammation and become sore (costochondritis). °DIAGNOSIS  °Lab tests or other studies, such as X-rays, electrocardiography, stress testing, or cardiac imaging, may be needed to find the cause of your pain.  °TREATMENT  °· Treatment depends on what may be causing your chest pain. Treatment may include: °· Acid blockers for heartburn. °· Anti-inflammatory medicine. °· Pain medicine for inflammatory conditions. °· Antibiotics if an infection is present. °· You may be advised to change lifestyle habits. This includes stopping smoking and avoiding alcohol, caffeine, and chocolate. °· You may be advised to keep your head raised (elevated) when sleeping. This reduces the chance of acid going backward from your stomach into your esophagus. °· Most of the time, nonspecific chest pain will improve within 2 to 3 days with rest and mild pain medicine. °HOME CARE INSTRUCTIONS  °· If antibiotics were prescribed, take your antibiotics as directed. Finish them even if you start to feel better. °· For the next few days, avoid physical  activities that bring on chest pain. Continue physical activities as directed. °· Do not smoke. °· Avoid drinking alcohol. °· Only take over-the-counter or prescription medicine for pain, discomfort, or fever as directed by your caregiver. °· Follow your caregiver's suggestions for further testing if your chest pain does not go away. °· Keep any follow-up appointments you made. If you do not go to an appointment, you could develop lasting (chronic) problems with pain. If there is any problem keeping an appointment, you must call to reschedule. °SEEK MEDICAL CARE IF:  °· You think you are having problems from the medicine you are taking. Read your medicine instructions carefully. °· Your chest pain does not go away, even after treatment. °· You develop a rash with blisters on your chest. °SEEK IMMEDIATE MEDICAL CARE IF:  °· You have increased chest pain or pain that spreads to your arm, neck, jaw, back, or abdomen. °· You develop shortness of breath, an increasing cough, or you are coughing up blood. °· You have severe back or abdominal pain, feel nauseous, or vomit. °· You develop severe weakness, fainting, or chills. °· You have a fever. °THIS IS AN EMERGENCY. Do not wait to see if the pain will go away. Get medical help at once. Call your local emergency services (911 in [place] Do not drive yourself to the hospital. °MAKE SURE YOU:  °· Understand these instructions. °· Will watch your condition. °· Will get help right away if you are not doing well or get worse. °Document Released: 04/22/2005 Document Revised: 10/05/2011 Document Reviewed: 02/16/2008 °[employer]® Patient Information ©2014 [employer]   [employer]. ° °

## 2013-09-18 ENCOUNTER — Telehealth: Payer: Self-pay | Admitting: *Deleted

## 2013-09-18 DIAGNOSIS — R072 Precordial pain: Secondary | ICD-10-CM

## 2013-09-18 NOTE — Telephone Encounter (Signed)
Message copied by Truett Mainland on Mon Sep 18, 2013  1:11 PM ------      Message from: Lestine Mount      Created: Mon Sep 18, 2013 12:51 PM       NEED ORDER PUT IN FOR 2-D ECHO DR MCDOWELL DX  PRECORDIAL PAIN ------

## 2013-09-18 NOTE — Telephone Encounter (Signed)
Order put in.

## 2013-09-20 ENCOUNTER — Encounter: Payer: Self-pay | Admitting: Gastroenterology

## 2013-09-20 ENCOUNTER — Encounter: Payer: Self-pay | Admitting: Adult Health

## 2013-09-20 ENCOUNTER — Ambulatory Visit (INDEPENDENT_AMBULATORY_CARE_PROVIDER_SITE_OTHER): Payer: Medicare Other | Admitting: Adult Health

## 2013-09-20 VITALS — BP 130/80 | HR 76 | Ht 63.0 in | Wt 254.0 lb

## 2013-09-20 DIAGNOSIS — R0789 Other chest pain: Secondary | ICD-10-CM

## 2013-09-20 DIAGNOSIS — K219 Gastro-esophageal reflux disease without esophagitis: Secondary | ICD-10-CM

## 2013-09-20 DIAGNOSIS — I200 Unstable angina: Secondary | ICD-10-CM | POA: Insufficient documentation

## 2013-09-20 DIAGNOSIS — R072 Precordial pain: Secondary | ICD-10-CM

## 2013-09-20 MED ORDER — DEXLANSOPRAZOLE 60 MG PO CPDR: 60.0000 mg | DELAYED_RELEASE_CAPSULE | Freq: Every day | ORAL | Status: AC

## 2013-09-20 NOTE — Patient Instructions (Addendum)
Chest Pain (Nonspecific) It is often hard to give a specific diagnosis for the cause of chest pain. There is always a chance that your pain could be related to something serious, such as a heart attack or a blood clot in the lungs. You need to follow up with your caregiver for further evaluation. CAUSES   Heartburn.  Pneumonia or bronchitis.  Anxiety or stress.  Inflammation around your heart (pericarditis) or lung (pleuritis or pleurisy).  A blood clot in the lung.  A collapsed lung (pneumothorax). It can develop suddenly on its own (spontaneous pneumothorax) or from injury (trauma) to the chest.  Shingles infection (herpes zoster virus). The chest wall is composed of bones, muscles, and cartilage. Any of these can be the source of the pain.  The bones can be bruised by injury.  The muscles or cartilage can be strained by coughing or overwork.  The cartilage can be affected by inflammation and become sore (costochondritis). DIAGNOSIS  Lab tests or other studies, such as X-rays, electrocardiography, stress testing, or cardiac imaging, may be needed to find the cause of your pain.  TREATMENT   Treatment depends on what may be causing your chest pain. Treatment may include:  Acid blockers for heartburn.  Anti-inflammatory medicine.  Pain medicine for inflammatory conditions.  Antibiotics if an infection is present.  You may be advised to change lifestyle habits. This includes stopping smoking and avoiding alcohol, caffeine, and chocolate.  You may be advised to keep your head raised (elevated) when sleeping. This reduces the chance of acid going backward from your stomach into your esophagus.  Most of the time, nonspecific chest pain will improve within 2 to 3 days with rest and mild pain medicine. HOME CARE INSTRUCTIONS   If antibiotics were prescribed, take your antibiotics as directed. Finish them even if you start to feel better.  For the next few days, avoid physical  activities that bring on chest pain. Continue physical activities as directed.  Do not smoke.  Avoid drinking alcohol.  Only take over-the-counter or prescription medicine for pain, discomfort, or fever as directed by your caregiver.  Follow your caregiver's suggestions for further testing if your chest pain does not go away.  Keep any follow-up appointments you made. If you do not go to an appointment, you could develop lasting (chronic) problems with pain. If there is any problem keeping an appointment, you must call to reschedule. SEEK MEDICAL CARE IF:   You think you are having problems from the medicine you are taking. Read your medicine instructions carefully.  Your chest pain does not go away, even after treatment.  You develop a rash with blisters on your chest. SEEK IMMEDIATE MEDICAL CARE IF:   You have increased chest pain or pain that spreads to your arm, neck, jaw, back, or abdomen.  You develop shortness of breath, an increasing cough, or you are coughing up blood.  You have severe back or abdominal pain, feel nauseous, or vomit.  You develop severe weakness, fainting, or chills.  You have a fever. THIS IS AN EMERGENCY. Do not wait to see if the pain will go away. Get medical help at once. Call your local emergency services (911 in [place] Do not drive yourself to the hospital. MAKE SURE YOU:   Understand these instructions.  Will watch your condition.  Will get help right away if you are not doing well or get worse. Document Released: 04/22/2005 Document Revised: 10/05/2011 Document Reviewed: 02/16/2008 [facility] Patient Information 2014 [place],  [employer]. Gastroesophageal Reflux Disease, Adult Gastroesophageal reflux disease (GERD) happens when acid from your stomach flows up into the esophagus. When acid comes in contact with the esophagus, the acid causes soreness (inflammation) in the esophagus. Over time, GERD may create small holes (ulcers) in the lining of the  esophagus. CAUSES   Increased body weight. This puts pressure on the stomach, making acid rise from the stomach into the esophagus.  Smoking. This increases acid production in the stomach.  Drinking alcohol. This causes decreased pressure in the lower esophageal sphincter (valve or ring of muscle between the esophagus and stomach), allowing acid from the stomach into the esophagus.  Late evening meals and a full stomach. This increases pressure and acid production in the stomach.  A malformed lower esophageal sphincter. Sometimes, no cause is found. SYMPTOMS   Burning pain in the lower part of the mid-chest behind the breastbone and in the mid-stomach area. This may occur twice a week or more often.  Trouble swallowing.  Sore throat.  Dry cough.  Asthma-like symptoms including chest tightness, shortness of breath, or wheezing. DIAGNOSIS  Your caregiver may be able to diagnose GERD based on your symptoms. In some cases, X-rays and other tests may be done to check for complications or to check the condition of your stomach and esophagus. TREATMENT  Your caregiver may recommend over-the-counter or prescription medicines to help decrease acid production. Ask your caregiver before starting or adding any new medicines.  HOME CARE INSTRUCTIONS   Change the factors that you can control. Ask your caregiver for guidance concerning weight loss, quitting smoking, and alcohol consumption.  Avoid foods and drinks that make your symptoms worse, such as:  Caffeine or alcoholic drinks.  Chocolate.  Peppermint or mint flavorings.  Garlic and onions.  Spicy foods.  Citrus fruits, such as oranges, lemons, or limes.  Tomato-based foods such as sauce, chili, salsa, and pizza.  Fried and fatty foods.  Avoid lying down for the 3 hours prior to your bedtime or prior to taking a nap.  Eat small, frequent meals instead of large meals.  Wear loose-fitting clothing. Do not wear anything  tight around your waist that causes pressure on your stomach.  Raise the head of your bed 6 to 8 inches with wood blocks to help you sleep. Extra pillows will not help.  Only take over-the-counter or prescription medicines for pain, discomfort, or fever as directed by your caregiver.  Do not take aspirin, ibuprofen, or other nonsteroidal anti-inflammatory drugs (NSAIDs). SEEK IMMEDIATE MEDICAL CARE IF:   You have pain in your arms, neck, jaw, teeth, or back.  Your pain increases or changes in intensity or duration.  You develop nausea, vomiting, or sweating (diaphoresis).  You develop shortness of breath, or you faint.  Your vomit is green, yellow, black, or looks like coffee grounds or blood.  Your stool is red, bloody, or black. These symptoms could be signs of other problems, such as heart disease, gastric bleeding, or esophageal bleeding. MAKE SURE YOU:   Understand these instructions.  Will watch your condition.  Will get help right away if you are not doing well or get worse. Document Released: 04/22/2005 Document Revised: 10/05/2011 Document Reviewed: 01/30/2011 [facility] Patient Information 2014 [place], [place]. Take dexilant Do eat late Or lay down after eating don't eat and drink at same time Refer to Dr Oneida Alar

## 2013-09-20 NOTE — Progress Notes (Addendum)
Subjective:     Patient ID: Gina Solis, female   DOB: April 06, 1961, 53 y.o.   MRN: [id number]  HPI Gina Solis is a 53 year old black female in complaining of pain in chest and has history of GERD.She was seen in ER at [facility] 2/13 and had EKG and chest xray that did not show cause of pain.She is sp breast cancer with chemo and radiation.This pain started about 2 weeks ago, and hurts and burns in mid chest, radiated to left arm when went to ER.Has some nausea.Pain is usually a 2 but when is bad it is a 8.Has had Prilosec brand change recently.She has stress test 2/26 and sees cardiologist this week.she said if she could burp it would feel better.Is going to have surgery on left breast for reconstruction in near future.Her voice is raspy and she has to clear her throat often.   Review of Systems See HPI for positives  Reviewed past medical,surgical, social and family history. Reviewed medications and allergies.     Objective:   Physical Exam BP 130/80  Pulse 76  Ht 5\' 3"  (1.6 m)  Wt 254 lb (115.214 kg)  BMI 45.01 kg/m2 Skin warm and dry. Neck: mid line trachea, enlarged thyroid. Lungs: clear to ausculation bilaterally. Cardiovascular: regular rate and rhythm.    Breasts:no dominate palpable mass, retraction or nipple discharge on right, on left, breast smaller sp lumpectomy and has skin changes sp radiation, has no nipple discharge does have pulling at scar, does having tenderness at sternum, abdomen soft non tender no masses felt, Discussed with pt that women do not have same pain as men with heart attack and to go to ER as needed.  Discussed may need thyroid US in future but will get cardiac ruled out first.  Assessment:     Mid sternal chest pain GERD    Plan:     Stop prilosec Rx dexilant 60 mg #30 1 daily with 6 refills Return 3/5 as scheduled Refer to Dr Oneida Alar for ?EGD to rule out and burn from radiation Review handouts on chest pain and GERD Eat bland soft, don't eat and drink at  same time and do not lay down after eating

## 2013-09-21 ENCOUNTER — Encounter (HOSPITAL_COMMUNITY): Payer: Medicare Other

## 2013-09-21 ENCOUNTER — Inpatient Hospital Stay (HOSPITAL_COMMUNITY): Admission: RE | Admit: 2013-09-21 | Payer: Medicare Other | Source: Ambulatory Visit

## 2013-09-22 ENCOUNTER — Ambulatory Visit (HOSPITAL_COMMUNITY)
Admission: RE | Admit: 2013-09-22 | Discharge: 2013-09-22 | Disposition: A | Discharge status: Home / Self Care | Payer: Medicare Other | Source: Ambulatory Visit | Attending: Cardiology | Admitting: Cardiology

## 2013-09-22 ENCOUNTER — Telehealth: Payer: Self-pay

## 2013-09-22 DIAGNOSIS — Z87891 Personal history of nicotine dependence: Secondary | ICD-10-CM | POA: Insufficient documentation

## 2013-09-22 DIAGNOSIS — Z6841 Body Mass Index (BMI) 40.0 and over, adult: Secondary | ICD-10-CM | POA: Insufficient documentation

## 2013-09-22 DIAGNOSIS — E785 Hyperlipidemia, unspecified: Secondary | ICD-10-CM | POA: Insufficient documentation

## 2013-09-22 DIAGNOSIS — I059 Rheumatic mitral valve disease, unspecified: Secondary | ICD-10-CM

## 2013-09-22 DIAGNOSIS — I10 Essential (primary) hypertension: Secondary | ICD-10-CM | POA: Insufficient documentation

## 2013-09-22 DIAGNOSIS — R0609 Other forms of dyspnea: Secondary | ICD-10-CM | POA: Insufficient documentation

## 2013-09-22 DIAGNOSIS — K219 Gastro-esophageal reflux disease without esophagitis: Secondary | ICD-10-CM | POA: Insufficient documentation

## 2013-09-22 DIAGNOSIS — R0989 Other specified symptoms and signs involving the circulatory and respiratory systems: Secondary | ICD-10-CM | POA: Insufficient documentation

## 2013-09-22 DIAGNOSIS — Z853 Personal history of malignant neoplasm of breast: Secondary | ICD-10-CM | POA: Insufficient documentation

## 2013-09-22 DIAGNOSIS — R072 Precordial pain: Secondary | ICD-10-CM | POA: Insufficient documentation

## 2013-09-22 NOTE — Telephone Encounter (Signed)
Message copied by Bernita Raisin on Fri Sep 22, 2013  3:51 PM ------      Message from: Satira Sark      Created: Fri Sep 22, 2013  2:43 PM       Reviewed. LVEF is normal in the range of 60-65%, no wall motion abnormalities. Mild diastolic dysfunction noted. This study would not necessarily be a      an explanation for her shortness of breath. Will await stress testing results. ------

## 2013-09-22 NOTE — Telephone Encounter (Signed)
LMTCB

## 2013-09-22 NOTE — Progress Notes (Signed)
*  PRELIMINARY RESULTS* Echocardiogram 2D Echocardiogram has been performed.  [place], [place] 09/22/2013, 1:49 PM

## 2013-09-26 ENCOUNTER — Encounter (HOSPITAL_COMMUNITY): Payer: Self-pay

## 2013-09-26 ENCOUNTER — Encounter (HOSPITAL_COMMUNITY)
Admission: RE | Admit: 2013-09-26 | Discharge: 2013-09-26 | Disposition: A | Discharge status: Home / Self Care | Payer: Medicare Other | Source: Ambulatory Visit | Attending: Cardiology | Admitting: Cardiology

## 2013-09-26 ENCOUNTER — Telehealth: Payer: Self-pay

## 2013-09-26 ENCOUNTER — Encounter (HOSPITAL_BASED_OUTPATIENT_CLINIC_OR_DEPARTMENT_OTHER): Payer: Self-pay | Admitting: *Deleted

## 2013-09-26 DIAGNOSIS — Z923 Personal history of irradiation: Secondary | ICD-10-CM | POA: Insufficient documentation

## 2013-09-26 DIAGNOSIS — I259 Chronic ischemic heart disease, unspecified: Secondary | ICD-10-CM | POA: Insufficient documentation

## 2013-09-26 DIAGNOSIS — Z9221 Personal history of antineoplastic chemotherapy: Secondary | ICD-10-CM | POA: Insufficient documentation

## 2013-09-26 DIAGNOSIS — R079 Chest pain, unspecified: Secondary | ICD-10-CM | POA: Insufficient documentation

## 2013-09-26 DIAGNOSIS — E785 Hyperlipidemia, unspecified: Secondary | ICD-10-CM | POA: Insufficient documentation

## 2013-09-26 DIAGNOSIS — Z853 Personal history of malignant neoplasm of breast: Secondary | ICD-10-CM | POA: Insufficient documentation

## 2013-09-26 DIAGNOSIS — R0602 Shortness of breath: Secondary | ICD-10-CM

## 2013-09-26 DIAGNOSIS — R0789 Other chest pain: Secondary | ICD-10-CM

## 2013-09-26 DIAGNOSIS — K219 Gastro-esophageal reflux disease without esophagitis: Secondary | ICD-10-CM | POA: Insufficient documentation

## 2013-09-26 MED ORDER — TECHNETIUM TC 99M SESTAMIBI - CARDIOLITE
10.0000 | Freq: Once | INTRAVENOUS | Status: AC | PRN
  Administered 2013-09-26: 10 via INTRAVENOUS

## 2013-09-26 MED ORDER — REGADENOSON 0.4 MG/5ML IV SOLN
INTRAVENOUS | Status: AC
  Filled 2013-09-26: qty 5

## 2013-09-26 MED ORDER — SODIUM CHLORIDE 0.9 % IJ SOLN
INTRAMUSCULAR | Status: AC
  Administered 2013-09-26: 10 mL via INTRAVENOUS
  Filled 2013-09-26: qty 10

## 2013-09-26 MED ORDER — NITROGLYCERIN 0.4 MG SL SUBL
SUBLINGUAL_TABLET | SUBLINGUAL | Status: AC
Start: 2013-09-26 — End: 2013-09-26
  Administered 2013-09-26: 0.4 mg via SUBLINGUAL
  Filled 2013-09-26: qty 1

## 2013-09-26 MED ORDER — TECHNETIUM TC 99M SESTAMIBI GENERIC - CARDIOLITE
30.0000 | Freq: Once | INTRAVENOUS | Status: AC | PRN
  Administered 2013-09-26: 30 via INTRAVENOUS

## 2013-09-26 NOTE — Telephone Encounter (Signed)
Spoke with daughter Vita Barley who will bring mother tomorrow for 0820 hrs apt with Dr.McDowell

## 2013-09-26 NOTE — Telephone Encounter (Signed)
Message copied by Bernita Raisin on Tue Sep 26, 2013  3:46 PM ------      Message from: Satira Sark      Created: Tue Sep 26, 2013  3:21 PM       Reviewed. Please let her know the test today was abnormal and consistent with CAD, likely in theLAD distribution. Please schedule office visit ASAP to discuss and make plans for cardiac catheterization to further evaluate. ------

## 2013-09-26 NOTE — Progress Notes (Signed)
Stress Lab Nurses Notes - Gina Solis  Gina Solis 09/26/2013 Reason for doing test: Chest Pain and Dyspnea Type of test: Stress Cardiolite Nurse performing test: Gerrit Halls, RN Nuclear Medicine Tech: Melburn Hake Echo Tech: Not Applicable MD performing test: S. McDowell/K.Lawrence NP Family MD: [facility] explained and consent signed: yes IV started: 22g jelco, Saline lock flushed, No redness or edema and Saline lock started in radiology Symptoms: Chest pain #7 & Fatigue Treatment/Intervention: NTG 0.4SL x 1 Reason test stopped: fatigue and chest pain After recovery IV was: Discontinued via X-ray tech and No redness or edema Patient to return to [facility]. Med at : 13:30 Patient discharged: Home Patient's Condition upon discharge was: stable Comments: During stress Peak BP 199/88 & HR 162 .  Recovery BP 141/90 & HR 95. Symptoms resolved in recovery. Chest pain #0 Gina Solis

## 2013-09-27 ENCOUNTER — Encounter: Payer: Self-pay | Admitting: *Deleted

## 2013-09-27 ENCOUNTER — Other Ambulatory Visit: Payer: Self-pay | Admitting: Interventional Cardiology

## 2013-09-27 ENCOUNTER — Encounter (HOSPITAL_COMMUNITY): Payer: Self-pay | Admitting: Pharmacy Technician

## 2013-09-27 ENCOUNTER — Encounter: Payer: Self-pay | Admitting: Cardiology

## 2013-09-27 ENCOUNTER — Ambulatory Visit (INDEPENDENT_AMBULATORY_CARE_PROVIDER_SITE_OTHER): Payer: Medicaid Other | Admitting: Cardiology

## 2013-09-27 VITALS — BP 156/96 | HR 96 | Ht 63.0 in | Wt 256.4 lb

## 2013-09-27 DIAGNOSIS — I10 Essential (primary) hypertension: Secondary | ICD-10-CM

## 2013-09-27 DIAGNOSIS — R9439 Abnormal result of other cardiovascular function study: Secondary | ICD-10-CM | POA: Insufficient documentation

## 2013-09-27 DIAGNOSIS — E785 Hyperlipidemia, unspecified: Secondary | ICD-10-CM

## 2013-09-27 DIAGNOSIS — I200 Unstable angina: Secondary | ICD-10-CM

## 2013-09-27 MED ORDER — ASPIRIN EC 81 MG PO TBEC: 81.0000 mg | DELAYED_RELEASE_TABLET | Freq: Every day | ORAL | Status: DC

## 2013-09-27 NOTE — Assessment & Plan Note (Signed)
Previous LDL 146 last year. Will need to consider statin therapy, particularly if ischemic heart disease is present.

## 2013-09-27 NOTE — Assessment & Plan Note (Signed)
Anginal chest pain with abnormal ST changes and perfusion evidence of LAD ischemia, LVEF 59%.

## 2013-09-27 NOTE — Assessment & Plan Note (Signed)
Moderate LVH with severe basal septal hypertrophy by recent echocardiogram. She is followed by Dr. Buelah Manis. At this point only on hydrochlorothiazide. May need additional agents.

## 2013-09-27 NOTE — Assessment & Plan Note (Signed)
Exertional, noted over the last 2-3 weeks. Cardiolite study from yesterday was abnormal as detailed above, consistent with ischemia in the LAD distribution. She is being scheduled for a cardiac catheterization with Dr. Tamala Julian tomorrow to assess for revascularization options. Lab work to be obtained on arrival to short stay.

## 2013-09-27 NOTE — Progress Notes (Signed)
Clinical Summary Ms. Haselton is a 53 y.o.female there was seen by me in consultation back in November 2014 and referral from Dr. Buelah Manis. She was describing dyspnea on exertion at that time as well as a general discomfort on the left side of her chest that she noted sometimes when she would lie down. ECG was nonspecific. My recommendation at that point was to obtain an echocardiogram as well as an exercise Cardiolite for cardiac structural and ischemic evaluation. She elected not to proceed with testing at that time.  Subsequent records reviewed. I note that she was seen in the ER on February 13 complaining of chest pain. Troponin I was normal at that time and ECG showed no acute ST segment changes. She called our office to get a followup visit, and we recommended that she go ahead and proceed with the cardiac testing that we had previously recommended, so that we could review it at her office visit.  Echocardiogram from February 27 showed moderate LVH with severe basal septal hypertrophy, LVEF 23-55%, grade 1 diastolic dysfunction, mild mitral regurgitation, and mild tricuspid regurgitation. Exercise Cardiolite from March 3 was abnormal showing ST segment changes consistent with ischemia, anginal chest pain, hypertensive response, and perfusion evidence of LAD distribution ischemia with LVEF 59%.  She is here with her daughter today. I reviewed the results of her testing and discuss the implications and risk for CAD. She tells me that the most recent chest pain symptoms have been left sided, radiation to the neck and left arm, precipitated by exertion. She states that this is different from the symptoms she was having last November. We discussed risks and benefits of a diagnostic cardiac catheterization to evaluate her coronary anatomy for revascularization options. She is in agreement to proceed.   Allergies  Allergen Reactions  . Codeine Nausea Only    Current Outpatient Prescriptions    Medication Sig Dispense Refill  . acetaminophen (TYLENOL) 500 MG tablet Take 1,000 mg by mouth every 6 (six) hours as needed. Pain      . dexlansoprazole (DEXILANT) 60 MG capsule Take 1 capsule (60 mg total) by mouth daily.  30 capsule  6  . diclofenac (VOLTAREN) 75 MG EC tablet TAKE 1 TABLET TWICE A DAY  60 tablet  1  . hydrochlorothiazide (HYDRODIURIL) 25 MG tablet Take 1 tablet (25 mg total) by mouth daily.  30 tablet  3  . lubiprostone (AMITIZA) 24 MCG capsule Take 1 capsule (24 mcg total) by mouth 2 (two) times daily with a meal.  60 capsule  3  . omeprazole (PRILOSEC) 20 MG capsule TAKE 1 TABLET BY MOUTH TWICE A DAY WITH A MEAL  60 capsule  5  . oxyCODONE-acetaminophen (PERCOCET/ROXICET) 5-325 MG per tablet Take 1 tablet by mouth every 4 (four) hours as needed for severe pain.  10 tablet  0  . traMADol (ULTRAM) 50 MG tablet Take 50 mg by mouth every 6 (six) hours as needed. Pain.      Marland Kitchen aspirin EC 81 MG tablet Take 1 tablet (81 mg total) by mouth daily.       No current facility-administered medications for this visit.    Past Medical History  Diagnosis Date  . Breast cancer 2008    Stage IIIB  . Dermatomyositis   . Essential hypertension, benign   . Hypercholesteremia   . Arthritis   . GERD (gastroesophageal reflux disease)   . Lymphedema of arm     Left  . Multiple thyroid nodules   .  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 Abdominal hysterectomy    . Tubal ligation  1985  . Esophagogastroduodenoscopy  05/02/2007    [id number] hiatal hernia, otherwise normal stomach./Normal duodenal bulb and second portion of the duodenum/White plaques seen from the proximal to the distal esophagus/ Shallow ulcerations with mild erythema seen scattered throughout the esophagus most pronounced in the distal esophagus.  HSV1  . Colonoscopy with esophagogastroduodenoscopy (egd)  07/15/2012    Procedure: COLONOSCOPY WITH  ESOPHAGOGASTRODUODENOSCOPY (EGD);  Surgeon: [name] Binder, MD;  Location: [facility];  Service: Endoscopy;  Laterality: N/A;  9:30  . Breast reconstruction  left    X 2, trying again in spring.   . Mastectomy, partial  2008    Left-lumpectomy-axillary nodes x14    Family History  Problem Relation Age of Onset  . Breast cancer Paternal Aunt   . Colon cancer Maternal Uncle     age 49s  . Kidney failure Mother   . Heart failure Mother   . Colon cancer Paternal Grandfather     ???    Social History Ms. Rhames reports that she quit smoking about 11 years ago. Her smoking use included Cigarettes. She has a 1 pack-year smoking history. She has never used smokeless tobacco. Ms. Offner reports that she does not drink alcohol.  Review of Systems No palpitations or syncope. Stable appetite. No reported bleeding problems. No claudication. Otherwise negative.  Physical Examination Filed Vitals:   09/27/13 0827  BP: 156/96  Pulse: 96   Filed Weights   09/27/13 0827  Weight: 256 lb 6.4 oz (116.302 kg)    Obese woman, appears comfortable at rest.  HEENT: Conjunctiva and lids normal, oropharynx clear.  Neck: Supple, no elevated JVP or carotid bruits, no thyromegaly.  Lungs: Clear to auscultation, nonlabored breathing at rest.  Cardiac: Regular rate and rhythm, no S3 or significant systolic murmur, no pericardial rub.  Abdomen: Soft, nontender, bowel sounds present.  Extremities: Trace edema, distal pulses 2+.  Skin: Warm and dry.  Musculoskeletal: No kyphosis.  Neuropsychiatric: Alert and oriented x3, affect grossly appropriate.   Problem List and Plan   Accelerating angina Exertional, noted over the last 2-3 weeks. Cardiolite study from yesterday was abnormal as detailed above, consistent with ischemia in the LAD distribution. She is being scheduled for a cardiac catheterization with Dr. Tamala Julian tomorrow to assess for revascularization options. Lab work to be obtained on arrival to  short stay.  Abnormal myocardial perfusion study Anginal chest pain with abnormal ST changes and perfusion evidence of LAD ischemia, LVEF 59%.  HYPERTENSION Moderate LVH with severe basal septal hypertrophy by recent echocardiogram. She is followed by Dr. Buelah Manis. At this point only on hydrochlorothiazide. May need additional agents.  HYPERLIPIDEMIA Previous LDL 146 last year. Will need to consider statin therapy, particularly if ischemic heart disease is present.    Satira Sark, M.D., F.A.C.C.

## 2013-09-27 NOTE — Patient Instructions (Signed)
   Continue Aspirin 81mg  daily Your physician has requested that you have a cardiac catheterization. Cardiac catheterization is used to diagnose and/or treat various heart conditions. Doctors may recommend this procedure for a number of different reasons. The most common reason is to evaluate chest pain. Chest pain can be a symptom of coronary artery disease (CAD), and cardiac catheterization can show whether plaque is narrowing or blocking your heart's arteries. This procedure is also used to evaluate the valves, as well as measure the blood flow and oxygen levels in different parts of your heart. For further information please visit [email or web address]. Please follow instruction sheet, as given. Follow up will be given after discharge

## 2013-09-28 ENCOUNTER — Encounter (HOSPITAL_COMMUNITY): Payer: Self-pay | Admitting: *Deleted

## 2013-09-28 ENCOUNTER — Encounter (HOSPITAL_COMMUNITY)
Admission: RE | Disposition: A | Discharge status: Home / Self Care | Payer: Medicare Other | Source: Ambulatory Visit | Attending: Interventional Cardiology

## 2013-09-28 ENCOUNTER — Other Ambulatory Visit: Payer: Medicare Other | Admitting: Adult Health

## 2013-09-28 ENCOUNTER — Ambulatory Visit (HOSPITAL_COMMUNITY)
Admission: RE | Admit: 2013-09-28 | Discharge: 2013-09-29 | Disposition: A | Discharge status: Home / Self Care | Payer: Medicare Other | Source: Ambulatory Visit | Attending: Interventional Cardiology | Admitting: Interventional Cardiology

## 2013-09-28 ENCOUNTER — Other Ambulatory Visit: Payer: Self-pay

## 2013-09-28 DIAGNOSIS — Z9289 Personal history of other medical treatment: Secondary | ICD-10-CM

## 2013-09-28 DIAGNOSIS — Z79899 Other long term (current) drug therapy: Secondary | ICD-10-CM | POA: Insufficient documentation

## 2013-09-28 DIAGNOSIS — R9439 Abnormal result of other cardiovascular function study: Secondary | ICD-10-CM

## 2013-09-28 DIAGNOSIS — I209 Angina pectoris, unspecified: Secondary | ICD-10-CM | POA: Insufficient documentation

## 2013-09-28 DIAGNOSIS — E042 Nontoxic multinodular goiter: Secondary | ICD-10-CM | POA: Insufficient documentation

## 2013-09-28 DIAGNOSIS — M3390 Dermatopolymyositis, unspecified, organ involvement unspecified: Secondary | ICD-10-CM | POA: Insufficient documentation

## 2013-09-28 DIAGNOSIS — Z853 Personal history of malignant neoplasm of breast: Secondary | ICD-10-CM

## 2013-09-28 DIAGNOSIS — I890 Lymphedema, not elsewhere classified: Secondary | ICD-10-CM | POA: Diagnosis present

## 2013-09-28 DIAGNOSIS — Z6841 Body Mass Index (BMI) 40.0 and over, adult: Secondary | ICD-10-CM | POA: Insufficient documentation

## 2013-09-28 DIAGNOSIS — I200 Unstable angina: Secondary | ICD-10-CM | POA: Diagnosis present

## 2013-09-28 DIAGNOSIS — M3313 Other dermatomyositis without myopathy: Secondary | ICD-10-CM | POA: Insufficient documentation

## 2013-09-28 DIAGNOSIS — M129 Arthropathy, unspecified: Secondary | ICD-10-CM | POA: Insufficient documentation

## 2013-09-28 DIAGNOSIS — E785 Hyperlipidemia, unspecified: Secondary | ICD-10-CM | POA: Diagnosis present

## 2013-09-28 DIAGNOSIS — Z87891 Personal history of nicotine dependence: Secondary | ICD-10-CM | POA: Insufficient documentation

## 2013-09-28 DIAGNOSIS — Z923 Personal history of irradiation: Secondary | ICD-10-CM | POA: Insufficient documentation

## 2013-09-28 DIAGNOSIS — K219 Gastro-esophageal reflux disease without esophagitis: Secondary | ICD-10-CM | POA: Diagnosis present

## 2013-09-28 DIAGNOSIS — I10 Essential (primary) hypertension: Secondary | ICD-10-CM | POA: Diagnosis present

## 2013-09-28 DIAGNOSIS — Z7982 Long term (current) use of aspirin: Secondary | ICD-10-CM | POA: Insufficient documentation

## 2013-09-28 DIAGNOSIS — Z9010 Acquired absence of unspecified breast and nipple: Secondary | ICD-10-CM | POA: Insufficient documentation

## 2013-09-28 DIAGNOSIS — I2510 Atherosclerotic heart disease of native coronary artery without angina pectoris: Secondary | ICD-10-CM | POA: Diagnosis present

## 2013-09-28 DIAGNOSIS — Z9221 Personal history of antineoplastic chemotherapy: Secondary | ICD-10-CM | POA: Insufficient documentation

## 2013-09-28 DIAGNOSIS — E7800 Pure hypercholesterolemia, unspecified: Secondary | ICD-10-CM | POA: Insufficient documentation

## 2013-09-28 HISTORY — PX: LEFT HEART CATHETERIZATION WITH CORONARY ANGIOGRAM: SHX5451

## 2013-09-28 HISTORY — DX: Atherosclerotic heart disease of native coronary artery without angina pectoris: I25.10

## 2013-09-28 LAB — BASIC METABOLIC PANEL
BUN: 16 mg/dL (ref 6–23)
CALCIUM: 9.6 mg/dL (ref 8.4–10.5)
CO2: 24 meq/L (ref 19–32)
Chloride: 102 mEq/L (ref 96–112)
Creatinine, Ser: 0.97 mg/dL (ref 0.50–1.10)
GFR calc Af Amer: 76 mL/min — ABNORMAL LOW (ref >90)
GFR calc non Af Amer: 66 mL/min — ABNORMAL LOW (ref >90)
Glucose, Bld: 89 mg/dL (ref 70–99)
Potassium: 3.7 mEq/L (ref 3.7–5.3)
Sodium: 141 mEq/L (ref 137–147)

## 2013-09-28 LAB — CBC
HCT: 38.9 % (ref 36.0–46.0)
Hemoglobin: 12.8 g/dL (ref 12.0–15.0)
MCH: 28.1 pg (ref 26.0–34.0)
MCHC: 32.9 g/dL (ref 30.0–36.0)
MCV: 85.5 fL (ref 78.0–100.0)
PLATELETS: 273 10*3/uL (ref 150–400)
RBC: 4.55 MIL/uL (ref 3.87–5.11)
RDW: 14.3 % (ref 11.5–15.5)
WBC: 6.8 10*3/uL (ref 4.0–10.5)

## 2013-09-28 LAB — PROTIME-INR
INR: 0.98 (ref 0.00–1.49)
Prothrombin Time: 12.8 seconds (ref 11.6–15.2)

## 2013-09-28 LAB — POCT ACTIVATED CLOTTING TIME: Activated Clotting Time: 642 seconds

## 2013-09-28 SURGERY — LEFT HEART CATHETERIZATION WITH CORONARY ANGIOGRAM
Anesthesia: LOCAL

## 2013-09-28 MED ORDER — HEPARIN SODIUM (PORCINE) 1000 UNIT/ML IJ SOLN
INTRAMUSCULAR | Status: AC
  Filled 2013-09-28: qty 1

## 2013-09-28 MED ORDER — ATORVASTATIN CALCIUM 80 MG PO TABS
80.0000 mg | ORAL_TABLET | ORAL | Status: AC
  Administered 2013-09-28: 80 mg via ORAL
  Filled 2013-09-28 (×2): qty 1

## 2013-09-28 MED ORDER — BIVALIRUDIN 250 MG IV SOLR
INTRAVENOUS | Status: AC
  Filled 2013-09-28: qty 250

## 2013-09-28 MED ORDER — ACETAMINOPHEN 500 MG PO TABS
1000.0000 mg | ORAL_TABLET | Freq: Four times a day (QID) | ORAL | Status: DC | PRN
  Administered 2013-09-28 – 2013-09-29 (×3): 1000 mg via ORAL
  Filled 2013-09-28 (×3): qty 2

## 2013-09-28 MED ORDER — ASPIRIN 81 MG PO CHEW
81.0000 mg | CHEWABLE_TABLET | Freq: Every day | ORAL | Status: DC
  Administered 2013-09-29: 10:00:00 81 mg via ORAL
  Filled 2013-09-28: qty 1

## 2013-09-28 MED ORDER — MIDAZOLAM HCL 2 MG/2ML IJ SOLN
INTRAMUSCULAR | Status: AC
  Filled 2013-09-28: qty 2

## 2013-09-28 MED ORDER — ASPIRIN 81 MG PO CHEW: 81.0000 mg | CHEWABLE_TABLET | ORAL | Status: DC

## 2013-09-28 MED ORDER — ONDANSETRON HCL 4 MG/2ML IJ SOLN
4.0000 mg | Freq: Four times a day (QID) | INTRAMUSCULAR | Status: DC
  Filled 2013-09-28: qty 2

## 2013-09-28 MED ORDER — SODIUM CHLORIDE 0.9 % IJ SOLN: 3.0000 mL | Freq: Two times a day (BID) | INTRAMUSCULAR | Status: DC

## 2013-09-28 MED ORDER — SODIUM CHLORIDE 0.9 % IV SOLN: 250.0000 mL | INTRAVENOUS | Status: DC | PRN

## 2013-09-28 MED ORDER — VERAPAMIL HCL 2.5 MG/ML IV SOLN
INTRAVENOUS | Status: AC
  Filled 2013-09-28: qty 2

## 2013-09-28 MED ORDER — TRAMADOL HCL 50 MG PO TABS
50.0000 mg | ORAL_TABLET | Freq: Four times a day (QID) | ORAL | Status: DC | PRN
  Administered 2013-09-29: 50 mg via ORAL
  Filled 2013-09-28 (×2): qty 1

## 2013-09-28 MED ORDER — LIDOCAINE HCL (PF) 1 % IJ SOLN
INTRAMUSCULAR | Status: AC
  Filled 2013-09-28: qty 30

## 2013-09-28 MED ORDER — SODIUM CHLORIDE 0.9 % IV SOLN
INTRAVENOUS | Status: DC
  Administered 2013-09-28: 09:00:00 via INTRAVENOUS

## 2013-09-28 MED ORDER — ONDANSETRON HCL 4 MG/2ML IJ SOLN: 4.0000 mg | Freq: Four times a day (QID) | INTRAMUSCULAR | Status: DC | PRN

## 2013-09-28 MED ORDER — LUBIPROSTONE 24 MCG PO CAPS
24.0000 ug | ORAL_CAPSULE | Freq: Two times a day (BID) | ORAL | Status: DC | PRN
  Filled 2013-09-28: qty 1

## 2013-09-28 MED ORDER — FENTANYL CITRATE 0.05 MG/ML IJ SOLN
INTRAMUSCULAR | Status: AC
  Filled 2013-09-28: qty 2

## 2013-09-28 MED ORDER — TICAGRELOR 90 MG PO TABS
90.0000 mg | ORAL_TABLET | Freq: Two times a day (BID) | ORAL | Status: DC
  Administered 2013-09-28 – 2013-09-29 (×3): 90 mg via ORAL
  Filled 2013-09-28 (×4): qty 1

## 2013-09-28 MED ORDER — PANTOPRAZOLE SODIUM 40 MG PO TBEC
40.0000 mg | DELAYED_RELEASE_TABLET | Freq: Every day | ORAL | Status: DC
  Administered 2013-09-28 – 2013-09-29 (×2): 40 mg via ORAL
  Filled 2013-09-28 (×2): qty 1

## 2013-09-28 MED ORDER — HEPARIN (PORCINE) IN NACL 2-0.9 UNIT/ML-% IJ SOLN
INTRAMUSCULAR | Status: AC
  Filled 2013-09-28: qty 1000

## 2013-09-28 MED ORDER — ATORVASTATIN CALCIUM 80 MG PO TABS: 80.0000 mg | ORAL_TABLET | Freq: Every day | ORAL | Status: DC

## 2013-09-28 MED ORDER — SODIUM CHLORIDE 0.9 % IV SOLN: INTRAVENOUS | Status: AC

## 2013-09-28 MED ORDER — HYDROCHLOROTHIAZIDE 25 MG PO TABS
25.0000 mg | ORAL_TABLET | Freq: Every day | ORAL | Status: DC
  Administered 2013-09-29: 10:00:00 25 mg via ORAL
  Filled 2013-09-28 (×2): qty 1

## 2013-09-28 MED ORDER — ONDANSETRON 4 MG PO TBDP
4.0000 mg | ORAL_TABLET | Freq: Two times a day (BID) | ORAL | Status: DC | PRN
  Filled 2013-09-28: qty 1

## 2013-09-28 MED ORDER — SODIUM CHLORIDE 0.9 % IJ SOLN: 3.0000 mL | INTRAMUSCULAR | Status: DC | PRN

## 2013-09-28 NOTE — Interval H&P Note (Signed)
History and Physical Interval Note:  09/28/2013 74:68 PM 53 year old female with a less than 4 week history of exertional dyspnea and chest discomfort who underwent a nuclear stress test within the last 72 hours demonstrating anterior wall ischemia. She has been referred for diagnostic catheterization.medical regimen includes aspirin one per day. Symptoms are class III, preventing her from doing activities of daily living without discomfort.Cath Lab Visit (complete for each Cath Lab visit)  Clinical Evaluation Leading to the Procedure:   ACS: no  Non-ACS:    Anginal Classification: CCS III  Anti-ischemic medical therapy: Minimal Therapy (1 class of medications)  Non-Invasive Test Results: High-risk stress test findings: cardiac mortality >3%/year  Prior CABG: No previous CABG        Robinette Haines  has presented today for surgery, with the diagnosis of abnormal cardiolite  The various methods of treatment have been discussed with the patient and family. After consideration of risks, benefits and other options for treatment, the patient has consented to  Procedure(s): LEFT HEART CATHETERIZATION WITH CORONARY ANGIOGRAM (N/A) as a surgical intervention .  The patient's history has been reviewed, patient examined, no change in status, stable for surgery.  I have reviewed the patient's chart and labs.  Questions were answered to the patient's satisfaction.     Sinclair Grooms

## 2013-09-28 NOTE — H&P (View-Only) (Signed)
Clinical Summary Gina Solis is a 53 y.o.female there was seen by me in consultation back in November 2014 and referral from Dr. Buelah Manis. She was describing dyspnea on exertion at that time as well as a general discomfort on the left side of her chest that she noted sometimes when she would lie down. ECG was nonspecific. My recommendation at that point was to obtain an echocardiogram as well as an exercise Cardiolite for cardiac structural and ischemic evaluation. She elected not to proceed with testing at that time.  Subsequent records reviewed. I note that she was seen in the ER on February 13 complaining of chest pain. Troponin I was normal at that time and ECG showed no acute ST segment changes. She called our office to get a followup visit, and we recommended that she go ahead and proceed with the cardiac testing that we had previously recommended, so that we could review it at her office visit.  Echocardiogram from February 27 showed moderate LVH with severe basal septal hypertrophy, LVEF 23-55%, grade 1 diastolic dysfunction, mild mitral regurgitation, and mild tricuspid regurgitation. Exercise Cardiolite from March 3 was abnormal showing ST segment changes consistent with ischemia, anginal chest pain, hypertensive response, and perfusion evidence of LAD distribution ischemia with LVEF 59%.  She is here with her daughter today. I reviewed the results of her testing and discuss the implications and risk for CAD. She tells me that the most recent chest pain symptoms have been left sided, radiation to the neck and left arm, precipitated by exertion. She states that this is different from the symptoms she was having last November. We discussed risks and benefits of a diagnostic cardiac catheterization to evaluate her coronary anatomy for revascularization options. She is in agreement to proceed.   Allergies  Allergen Reactions  . Codeine Nausea Only    Current Outpatient Prescriptions    Medication Sig Dispense Refill  . acetaminophen (TYLENOL) 500 MG tablet Take 1,000 mg by mouth every 6 (six) hours as needed. Pain      . dexlansoprazole (DEXILANT) 60 MG capsule Take 1 capsule (60 mg total) by mouth daily.  30 capsule  6  . diclofenac (VOLTAREN) 75 MG EC tablet TAKE 1 TABLET TWICE A DAY  60 tablet  1  . hydrochlorothiazide (HYDRODIURIL) 25 MG tablet Take 1 tablet (25 mg total) by mouth daily.  30 tablet  3  . lubiprostone (AMITIZA) 24 MCG capsule Take 1 capsule (24 mcg total) by mouth 2 (two) times daily with a meal.  60 capsule  3  . omeprazole (PRILOSEC) 20 MG capsule TAKE 1 TABLET BY MOUTH TWICE A DAY WITH A MEAL  60 capsule  5  . oxyCODONE-acetaminophen (PERCOCET/ROXICET) 5-325 MG per tablet Take 1 tablet by mouth every 4 (four) hours as needed for severe pain.  10 tablet  0  . traMADol (ULTRAM) 50 MG tablet Take 50 mg by mouth every 6 (six) hours as needed. Pain.      Marland Kitchen aspirin EC 81 MG tablet Take 1 tablet (81 mg total) by mouth daily.       No current facility-administered medications for this visit.    Past Medical History  Diagnosis Date  . Breast cancer 2008    Stage IIIB  . Dermatomyositis   . Essential hypertension, benign   . Hypercholesteremia   . Arthritis   . GERD (gastroesophageal reflux disease)   . Lymphedema of arm     Left  . Multiple thyroid nodules   .  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 Abdominal hysterectomy    . Tubal ligation  1985  . Esophagogastroduodenoscopy  05/02/2007    SLF:Small hiatal hernia, otherwise normal stomach./Normal duodenal bulb and second portion of the duodenum/White plaques seen from the proximal to the distal esophagus/ Shallow ulcerations with mild erythema seen scattered throughout the esophagus most pronounced in the distal esophagus.  HSV1  . Colonoscopy with esophagogastroduodenoscopy (egd)  07/15/2012    Procedure: COLONOSCOPY WITH  ESOPHAGOGASTRODUODENOSCOPY (EGD);  Surgeon: Sandi L Fields, MD;  Location: [facility];  Service: Endoscopy;  Laterality: N/A;  9:30  . Breast reconstruction  left    X 2, trying again in spring.   . Mastectomy, partial  2008    Left-lumpectomy-axillary nodes x14    Family History  Problem Relation Age of Onset  . Breast cancer Paternal Aunt   . Colon cancer Maternal Uncle     age 60s  . Kidney failure Mother   . Heart failure Mother   . Colon cancer Paternal Grandfather     ???    Social History Ms. Laffoon reports that she quit smoking about 11 years ago. Her smoking use included Cigarettes. She has a 1 pack-year smoking history. She has never used smokeless tobacco. Ms. Harrington reports that she does not drink alcohol.  Review of Systems No palpitations or syncope. Stable appetite. No reported bleeding problems. No claudication. Otherwise negative.  Physical Examination Filed Vitals:   09/27/13 0827  BP: 156/96  Pulse: 96   Filed Weights   09/27/13 0827  Weight: 256 lb 6.4 oz (116.302 kg)    Obese woman, appears comfortable at rest.  HEENT: Conjunctiva and lids normal, oropharynx clear.  Neck: Supple, no elevated JVP or carotid bruits, no thyromegaly.  Lungs: Clear to auscultation, nonlabored breathing at rest.  Cardiac: Regular rate and rhythm, no S3 or significant systolic murmur, no pericardial rub.  Abdomen: Soft, nontender, bowel sounds present.  Extremities: Trace edema, distal pulses 2+.  Skin: Warm and dry.  Musculoskeletal: No kyphosis.  Neuropsychiatric: Alert and oriented x3, affect grossly appropriate.   Problem List and Plan   Accelerating angina Exertional, noted over the last 2-3 weeks. Cardiolite study from yesterday was abnormal as detailed above, consistent with ischemia in the LAD distribution. She is being scheduled for a cardiac catheterization with Dr. Smith tomorrow to assess for revascularization options. Lab work to be obtained on arrival to  short stay.  Abnormal myocardial perfusion study Anginal chest pain with abnormal ST changes and perfusion evidence of LAD ischemia, LVEF 59%.  HYPERTENSION Moderate LVH with severe basal septal hypertrophy by recent echocardiogram. She is followed by Dr. La Coma. At this point only on hydrochlorothiazide. May need additional agents.  HYPERLIPIDEMIA Previous LDL 146 last year. Will need to consider statin therapy, particularly if ischemic heart disease is present.    Samuel G. McDowell, M.D., F.A.C.C.   

## 2013-09-28 NOTE — Care Management Note (Addendum)
  Page 1 of 1   09/29/2013     2:17:12 PM   CARE MANAGEMENT NOTE 09/29/2013  Patient:  Gina Solis, Gina Solis   Account Number:  [id number]  Date Initiated:  09/28/2013  Documentation initiated by:  Kavan Devan  Subjective/Objective Assessment:   Adm with hx/o dyspnea and chest discomfort;  underwent Solis nuclear stress test within the last 72 hours demonstrating anterior wall ischemia; class III     Action/Plan:   CM to follow for disposition needs   Anticipated DC Date:  09/28/2013   Anticipated DC Plan:  HOME/SELF CARE         Choice offered to / List presented to:             Status of service:  Completed, signed off Medicare Important Message given?   (If response is "NO", the following Medicare IM given date fields will be blank) Date Medicare IM given:   Date Additional Medicare IM given:    Discharge Disposition:  HOME/SELF CARE  Per UR Regulation:  Reviewed for med. necessity/level of care/duration of stay  If discussed at [place] of Stay Meetings, dates discussed:    Comments:  09/29/2013 ADD:  today  09/29/13 Mohd. Derflinger RN, BSN, MSHL, CCM 09/29/2013  OPIB:  Left Heart Cath 09/28/2013 Benefits Check: CMA Request Brilinta 90mg  po BID #60 Pt has medicaid [address] RN, BSN, [place], [place] 09/28/2013

## 2013-09-28 NOTE — CV Procedure (Signed)
     Left Heart Catheterization with Coronary Angiography  Report  Gina Solis  53 y.o.  female 11-15-1960  Procedure Date: 09/28/2013  Referring Physician: Rozann Lesches, M.D. Primary Cardiologist:: Rozann Lesches, M.D.  INDICATIONS: Class 2-3 angina and high-risk myocardial perfusion study.  PROCEDURE: 1. Diagnostic left heart catheterization; 2.Coronary angiography; 3. Left ventriculography  CONSENT:  The risks, benefits, and details of the procedure were explained in detail to the patient. Risks including death, stroke, heart attack, kidney injury, allergy, limb ischemia, bleeding and radiation injury were discussed.  The patient verbalized understanding and wanted to proceed.  Informed written consent was obtained.  PROCEDURE TECHNIQUE:  After Xylocaine anesthesia a 5 French Slender sheath was placed in the right radial artery with a single anterior needle wall stick.  Coronary angiography was done using a 5 [place] JR 4 and JL 3.5 diagnostic catheters.  Left ventriculography was done using the 5 FrenchJR 4 catheter and hand injection.   We reviewed the digital images. We identified a high grade segmental LAD stenosis starting at the ostium and extending into the proximal vessel for proximally to 15 mm. Other vascular territories were widely patent. In the setting of previous left chest radiation, coronary bypass grafting with internal mammary artery use would have a high risk of potential healing concerns. After discussing the treatment options with the patient we decided to proceed with PCI. After awakening the patient to discuss the findings she develops severe radial/brachial spasm that made it very difficult to remove the diagnostic catheter and the guidewire. We spent some time attempting to recent date the patient and to reverse vasospasm but whenever able to advance the guide catheter to the ascending aorta. For that reason we terminated the diagnostic procedure from the  radial approach and converted to right femoral. Please see the separate PCI report.  MEDICATIONS: 3 mg Versed; 75 mcg fentanyl    CONTRAST:  Total of 100 cc.  COMPLICATIONS:  Severe radial artery spasm   HEMODYNAMICS:  Aortic pressure 153/89 mmHg ; LV pressure 156/72mmHg; LVEDP 16 mmHg  ANGIOGRAPHIC DATA:   The left main coronary artery is widely patent.  The left anterior descending artery is severely diseased starting at the origin from the left main and extending 15 mm into the proximal segment. The LAD is obstructed up to 85%.  The left circumflex artery is large and gives origin to 2 large obtuse marginal branches.no significant obstruction is noted..  The right coronary artery is dominant and free of any significant obstruction.Marland Kitchen   LEFT VENTRICULOGRAM:  Left ventricular angiogram was done in the 30 RAO projection and revealed LVEF 70% with symmetrical wall motion.   IMPRESSIONS:  1. Severe ostial LAD stenosis in a patient with prior left mastectomy and high-dose radiation.  2. Widely patent circumflex and right coronary  3. Normal left ventricular function   RECOMMENDATION:  Have recommended that the patient undergo percutaneous intervention with stenting of the ostial to proximal LAD.

## 2013-09-28 NOTE — CV Procedure (Signed)
     PERCUTANEOUS CORONARY INTERVENTION   Gina Solis is a 53 y.o. female  INDICATION: Severe ostial LAD obstruction with concomitant abnormal myocardial perfusion study demonstrating a high risk setting with anterior wall ischemia  Primary Cardiologist: Rozann Lesches, M.D.  PROCEDURE: 1. Drug-eluting stent implantation proximal/ostial LAD   CONSENT: The risks, benefits, and details of the procedure were explained to the patient. Risks including death, MI, stroke, bleeding, limb ischemia, emergency CABG, renal failure and allergy were described and accepted by the patient.  Informed written consent was obtained prior to proceeding.  PROCEDURE TECHNIQUE:  After Xylocaine anesthesia a 6 French sheath was placed in the right femoral artery with a single anterior needle wall stick.   Coronary guiding shots were made using a XB LAD 3.0 cm guide catheter. Antithrombotic therapy,bivalirudin bolus and infusion, was begun and determined to be therapeutic by ACT. Antiplatelet therapy, Brilinta 180 mg, was loaded.  A 2.5 x 15 mm emerge balloon was used for pre-dilatation. We then positioned and deployed to a 2.75 by 18 mm long Xience Alpine DES. The stent was deployed to 14 atmospheres. Postdilatation was then performed with a 15 x 3.25 mm [place] balloon to 13 atmospheres. I still felt that the stent was under sized. We therefore upgraded to a 3.5 x 15 [place] Emerge and post dilated to 14 atmospheres. The final angiographic result was felt to be appropriately matched for the vessel diameter. 0% stenosis was noted post stent deployment. TIMI grade 3 flow was noted. No immediate post procedure complications were noted .  CONTRAST:  Total of 125 cc.  COMPLICATIONS:  none.    ANGIOGRAPHIC RESULTS:   Segmental ostial to proximal 85-90% LAD stenosis reduced to 0% with TIMI grade 3 flow. LAD, pulse still diameter was 3.5 mm at high pressure   IMPRESSIONS:  1. Successful ostial proximal LAD DES implantation  with reduction in stenosis from 85% to 0% with TIMI grade 3 flow   RECOMMENDATION:  Aspirin and Brilinta (or other dual antiplatelet therapy regimen)for greater than 12 months and possibly indefinitely.  Candidate for AM discharge if right radial and right femoral cath sites are unremarkable and the patient has had no ischemic complications.    Sinclair Grooms, MD 09/28/2013 2:48 PM

## 2013-09-29 ENCOUNTER — Encounter (HOSPITAL_COMMUNITY): Payer: Self-pay | Admitting: Physician Assistant

## 2013-09-29 DIAGNOSIS — Z9289 Personal history of other medical treatment: Secondary | ICD-10-CM

## 2013-09-29 DIAGNOSIS — I2510 Atherosclerotic heart disease of native coronary artery without angina pectoris: Secondary | ICD-10-CM | POA: Diagnosis present

## 2013-09-29 DIAGNOSIS — R9439 Abnormal result of other cardiovascular function study: Secondary | ICD-10-CM

## 2013-09-29 DIAGNOSIS — I200 Unstable angina: Secondary | ICD-10-CM

## 2013-09-29 DIAGNOSIS — I10 Essential (primary) hypertension: Secondary | ICD-10-CM

## 2013-09-29 HISTORY — PX: CORONARY ANGIOPLASTY WITH STENT PLACEMENT: SHX49

## 2013-09-29 LAB — CBC
HEMATOCRIT: 38.5 % (ref 36.0–46.0)
HEMOGLOBIN: 12.9 g/dL (ref 12.0–15.0)
MCH: 28.4 pg (ref 26.0–34.0)
MCHC: 33.5 g/dL (ref 30.0–36.0)
MCV: 84.8 fL (ref 78.0–100.0)
Platelets: 251 10*3/uL (ref 150–400)
RBC: 4.54 MIL/uL (ref 3.87–5.11)
RDW: 14.3 % (ref 11.5–15.5)
WBC: 7.3 10*3/uL (ref 4.0–10.5)

## 2013-09-29 LAB — BASIC METABOLIC PANEL
BUN: 10 mg/dL (ref 6–23)
CO2: 26 mEq/L (ref 19–32)
Calcium: 9.3 mg/dL (ref 8.4–10.5)
Chloride: 102 mEq/L (ref 96–112)
Creatinine, Ser: 0.97 mg/dL (ref 0.50–1.10)
GFR calc non Af Amer: 66 mL/min — ABNORMAL LOW (ref >90)
GFR, EST AFRICAN AMERICAN: 76 mL/min — AB (ref >90)
Glucose, Bld: 100 mg/dL — ABNORMAL HIGH (ref 70–99)
Potassium: 3.7 mEq/L (ref 3.7–5.3)
Sodium: 141 mEq/L (ref 137–147)

## 2013-09-29 MED ORDER — ATORVASTATIN CALCIUM 80 MG PO TABS: 80.0000 mg | ORAL_TABLET | Freq: Every day | ORAL | Status: DC

## 2013-09-29 MED ORDER — ASPIRIN 81 MG PO TABS: 81.0000 mg | ORAL_TABLET | Freq: Every day | ORAL | Status: DC

## 2013-09-29 MED ORDER — TICAGRELOR 90 MG PO TABS: 90.0000 mg | ORAL_TABLET | Freq: Two times a day (BID) | ORAL | Status: DC

## 2013-09-29 MED ORDER — METOPROLOL TARTRATE 25 MG PO TABS
25.0000 mg | ORAL_TABLET | Freq: Two times a day (BID) | ORAL | Status: DC
  Administered 2013-09-29: 12:00:00 25 mg via ORAL

## 2013-09-29 MED ORDER — METOPROLOL TARTRATE 25 MG PO TABS: 25.0000 mg | ORAL_TABLET | Freq: Two times a day (BID) | ORAL | Status: DC

## 2013-09-29 MED FILL — Sodium Chloride IV Soln 0.9%: INTRAVENOUS | Qty: 50 | Status: AC

## 2013-09-29 NOTE — Discharge Summary (Signed)
Patient seen and examined and history reviewed. Agree with above findings and plan. See my earlier rounding note. Collier Salina JordanMD 09/29/2013 10:50 AM

## 2013-09-29 NOTE — Progress Notes (Signed)
TELEMETRY: Reviewed telemetry pt in NSR  Filed Vitals:   09/28/13 2000 09/28/13 2232 09/29/13 0011 09/29/13 0524  BP: 131/56  162/70 162/62  Pulse: 89 79 83 91  Temp: 98.4 F (36.9 C)  98.1 F (36.7 C) 98 F (36.7 C)  TempSrc: Oral  Oral Oral  Resp: 18  18 18   Height:  5\' 3"  (1.6 m)    Weight:    257 lb 0.9 oz (116.6 kg)  SpO2: 99% 100% 99% 99%    Intake/Output Summary (Last 24 hours) at 09/29/13 0725 Last data filed at 09/28/13 2200  Gross per 24 hour  Intake  752.5 ml  Output    400 ml  Net  352.5 ml    SUBJECTIVE Still some soreness in the right arm. No chest pain. BP elevated but patient reports she hasn't had med in 3 days.  LABS: Basic Metabolic Panel:  Recent Labs  09/28/13 0830 09/29/13 0555  NA 141 141  K 3.7 3.7  CL 102 102  CO2 24 26  GLUCOSE 89 100*  BUN 16 10  CREATININE 0.97 0.97  CALCIUM 9.6 9.3   Liver Function Tests: No results found for this basename: AST, ALT, ALKPHOS, BILITOT, PROT, ALBUMIN,  in the last 72 hours No results found for this basename: LIPASE, AMYLASE,  in the last 72 hours CBC:  Recent Labs  09/28/13 0830 09/29/13 0555  WBC 6.8 7.3  HGB 12.8 12.9  HCT 38.9 38.5  MCV 85.5 84.8  PLT 273 251    Radiology/Studies:  Dg Chest 2 View  09/08/2013   CLINICAL DATA:  Left-sided chest pain and left upper extremity numbness over the past 2 days. Shortness of breath. Productive cough. Left earache.  EXAM: CHEST  2 VIEW  COMPARISON:  CT CHEST W/CM dated 04/28/2013; DG CHEST 2 VIEW dated 04/24/2013; CT ABD/PELV WO CM dated 04/27/2012; DG CHEST 1V PORT dated 08/10/2010  FINDINGS: Cardiomediastinal silhouette unremarkable and unchanged. Lungs clear. Bronchovascular markings normal. Pulmonary vascularity normal. No visible pleural effusions. No pneumothorax. Left upper lobe nodule identified on prior CT not visualized on the chest x-ray. Prior left mastectomy and axillary node dissection. Visualized bony thorax intact.  IMPRESSION: No acute  cardiopulmonary disease.   Electronically Signed   By: Evangeline Dakin M.D.   On: 09/08/2013 09:26   Nm Myocar Single W/spect W/wall Motion And Ef  09/26/2013   CLINICAL DATA:  53 year old woman with a history of breast cancer status post chemotherapy and radiation, hypertension, hyperlipidemia, and GERD, referred for the assessment of ischemia.  EXAM: MYOCARDIAL IMAGING WITH SPECT (REST AND EXERCISE)  GATED LEFT VENTRICULAR WALL MOTION STUDY  LEFT VENTRICULAR EJECTION FRACTION  TECHNIQUE: Standard myocardial SPECT imaging was performed after resting intravenous injection of 10 mCi Tc-73m sestamibi. Subsequently, exercise tolerance test was performed by the patient under the supervision of the Cardiology staff. At peak-stress, 30 mCi Tc-31m sestamibi was injected intravenously and standard myocardial SPECT imaging was performed. Quantitative gated imaging was also performed to evaluate left ventricular wall motion, and estimate left ventricular ejection fraction.  COMPARISON:  None.  FINDINGS: Baseline tracing shows normal sinus rhythm with decreased R wave progression. Patient exercise on a Bruce protocol for 4 min and 31 seconds achieving a maximum work load of 7 METS. Peak heart rate was 164 beats per min which was 97% of the maximal age predicted heart rate response. Peak blood pressure was 199/88. Patient developed anginal chest pain during the study, and abnormal ST segment depression  in leads II, III, AVF, and V4 through V6 consistent with ischemia. Lead motion artifact was also noted, there were no arrhythmias. ST segment changes became nondiagnostic by 5 min in recovery.  Analysis of the overall perfusion data finds adequate radiotracer uptake.  Tomographic views were obtained using the short axis, vertical long axis, and horizontal long axis planes. There is a moderate to large-sized, moderate to severe intensity, anteroseptal wall defect that is partially reversible consistent with ischemia in the LAD  distribution.  Gated imaging reveals an EDV of a 74, ESV of 30, TID ratio 0.85, and LVEF of 59% without wall motion abnormalities.  IMPRESSION: High risk abnormal exercise Cardiolite. Patient developed anginal chest pain and abnormal ST changes resulting in discontinuation of the study at a maximum work load of 7 METS and adequate heart rate response. There was a hypertensive response to exercise. Perfusion imaging is consistent with ischemia in the LAD distribution. LVEF is normal at 59% with normal volumes.   Electronically Signed   By: Rozann Lesches M.D.   On: 09/26/2013 15:02   Ecg: Normal.  PHYSICAL EXAM General: Well developed, well nourished, in no acute distress. Head: Normocephalic, atraumatic, sclera non-icteric, no xanthomas, nares are without discharge. Neck: Negative for carotid bruits. JVD not elevated. Lungs: Clear bilaterally to auscultation without wheezes, rales, or rhonchi. Breathing is unlabored. Heart: RRR S1 S2 without murmurs, rubs, or gallops.  Abdomen: Soft, non-tender, non-distended with normoactive bowel sounds. No hepatomegaly. No rebound/guarding. No obvious abdominal masses. Msk:  Strength and tone appears normal for age. Extremities: No clubbing, cyanosis or edema.  Distal pedal pulses are 2+ and equal bilaterally. No right groin hematoma. No radial site hematoma.  Neuro: Alert and oriented X 3. Moves all extremities spontaneously. Psych:  Responds to questions appropriately with a normal affect.  ASSESSMENT AND PLAN: 1. Unstable angina s/p DES ostial LAD. Continue DAPT with ASA and Bilinta for at least one year. Stable for DC today. Follow up with Dr. Domenic Polite in 2 weeks. 2. HTN- will resume hydrodiuril and monitor. May need additional BP meds if it remains high. 3. Hypercholesterolemia. Start high dose statin.  Active Problems:   Other and unspecified angina pectoris    Signed, Cheyrl Buley [place] MD,FACC 09/29/2013 7:29 AM

## 2013-09-29 NOTE — Progress Notes (Signed)
Katie PA called back ordered metoprolol which I gave to patient and 1hour 30 min later walked her approx 1000 ft she tolerated well maintained heart rate in 90's BP 164/84 . The plan is now to add this medicine and she will follow up in the office she is now ready for discharge home today.

## 2013-09-29 NOTE — Progress Notes (Signed)
Notified Katie PA about patient heart rate elevated with activity 130's and elevated BP. No orders received patient waiting for ride for discharge.

## 2013-09-29 NOTE — Progress Notes (Signed)
CARDIAC REHAB PHASE I   PRE:  Rate/Rhythm: 80 SR    BP: sitting 164/68    SaO2:   MODE:  Ambulation: 500 ft   POST:  Rate/Rhythm: 109 sT    BP: sitting 181/75     SaO2:   Pt tolerated fairly well. Tires easily, probably some element of deconditioning and her weight. Also has a arthritic ankle. Ed completed encouraging slow increase in her exercise. Pt interested in [place] and will send referral to [place].  [phone]   Gina Solis CES, ACSM 09/29/2013 9:16 AM

## 2013-09-29 NOTE — Discharge Summary (Signed)
Patient seen and examined and history reviewed. Agree with above findings and plan. See earlier rounding note. With elevated HR on exertion we will start beta blocker therapy.  Luana Shu 09/29/2013 6:28 PM

## 2013-09-29 NOTE — Discharge Instructions (Signed)
Radial Site Care °Refer to this sheet in the next few weeks. These instructions provide you with information on caring for yourself after your procedure. Your caregiver may also give you more specific instructions. Your treatment has been planned according to current medical practices, but problems sometimes occur. Call your caregiver if you have any problems or questions after your procedure. °HOME CARE INSTRUCTIONS °· You may shower the day after the procedure. Remove the bandage (dressing) and gently wash the site with plain soap and water. Gently pat the site dry.  °· Do not apply powder or lotion to the site.  °· Do not submerge the affected site in water for 3 to 5 days.  °· Inspect the site at least twice daily.  °· Do not flex or bend the affected arm for 24 hours.  °· No lifting over 5 pounds (2.3 kg) for 5 days after your procedure.  °· Do not drive home if you are discharged the same day of the procedure. Have someone else drive you.  °· You may drive 24 hours after the procedure unless otherwise instructed by your caregiver.  °What to expect: °· Any bruising will usually fade within 1 to 2 weeks.  °· Blood that collects in the tissue (hematoma) may be painful to the touch. It should usually decrease in size and tenderness within 1 to 2 weeks.  °SEEK IMMEDIATE MEDICAL CARE IF: °· You have unusual pain at the radial site.  °· You have redness, warmth, swelling, or pain at the radial site.  °· You have drainage (other than a small amount of blood on the dressing).  °· You have chills.  °· You have a fever or persistent symptoms for more than 72 hours.  °· You have a fever and your symptoms suddenly get worse.  °· Your arm becomes pale, cool, tingly, or numb.  °· You have heavy bleeding from the site. Hold pressure on the site.  ° ° °Groin Site Care °Refer to this sheet in the next few weeks. These instructions provide you with information on caring for yourself after your procedure. Your caregiver may also  give you more specific instructions. Your treatment has been planned according to current medical practices, but problems sometimes occur. Call your caregiver if you have any problems or questions after your procedure. °HOME CARE INSTRUCTIONS °· You may shower 24 hours after the procedure. Remove the bandage (dressing) and gently wash the site with plain soap and water. Gently pat the site dry.  °· Do not apply powder or lotion to the site.  °· Do not sit in a bathtub, swimming pool, or whirlpool for 5 to 7 days.  °· No bending, squatting, or lifting anything over 10 pounds (4.5 kg) as directed by your caregiver.  °· Inspect the site at least twice daily.  °· Do not drive home if you are discharged the same day of the procedure. Have someone else drive you.  °· You may drive 24 hours after the procedure unless otherwise instructed by your caregiver.  °What to expect: °· Any bruising will usually fade within 1 to 2 weeks.  °· Blood that collects in the tissue (hematoma) may be painful to the touch. It should usually decrease in size and tenderness within 1 to 2 weeks.  °SEEK IMMEDIATE MEDICAL CARE IF: °· You have unusual pain at the groin site or down the affected leg.  °· You have redness, warmth, swelling, or pain at the groin site.  °· You have drainage (  other than a small amount of blood on the dressing).  °· You have chills.  °· You have a fever or persistent symptoms for more than 72 hours.  °· You have a fever and your symptoms suddenly get worse.  °· Your leg becomes pale, cool, tingly, or numb.  °You have heavy bleeding from the site. Hold pressure on the site. . ° °

## 2013-09-29 NOTE — Discharge Summary (Addendum)
Discharge Summary   Patient ID: Gina Solis MRN: [id number], DOB/AGE: [birth year] 53 y.o. Admit date: 09/28/2013 D/C date:     09/29/2013  Primary Cardiologist: Dr. Domenic Polite  Principal Problem:   Accelerating angina   CAD (coronary artery disease)  Active Problems:     HYPERLIPIDEMIA   Morbid obesity: BMI 45.5   HYPERTENSION   LYMPHEDEMA, LEFT ARM   GERD   BREAST CANCER, HX OF   History of echocardiogram   Admission Dates: 09/28/13-09/29/13 Discharge Diagnosis: Accelerated angina s/p 2.75 x18 mm Xience Alpine DES to ostial LAD on this admission.  HPI: Gina Solis is a 53 y.o. female with a history of morbid obesity, breast cancer s/p radiation, GERD, HTN and HLD who presented to [facility] on 09/27/13 for an elective cardiac catheterization. Dr. Domenic Polite had seen her in the office on 09/27/13 for evaluation of worsening exertional chest pain after an ER visit in February for chest pain. Troponin I was normal at that time and ECG showed no acute ST segment changes. Subsequent ECHO on 09/22/13 showed moderate LVH with severe basal septal hypertrophy, LVEF 79-02%, grade 1 diastolic dysfunction, mild MR/TR. Exercise Cardiolite from 09/26/13 was abnormal showing ST segment changes consistent with ischemia and perfusion evidence of LAD distribution ischemia with LVEF 59%. Additionally, the patient had anginal chest pain and a hypertensive response during the testing. Her results were discussed at a follow up visit and it was elected to proceed with a cardiac cath the following day. On 09/28/13 she underwent cardiac catheterization which revealed severe ostial LAD obstruction which was successfully treated with DES implantation to proximal/ostial LAD. She was started on DAPT with ASA/Brilinta which will be continued for 12 months and possibly indefinitely.   The patient has had an uncomplicated hospital course and is recovering well. She does complain of some mild soreness in the right arm, but the radial and  femoral catheter sites are healing well. Her BP is elevated; however, she has not had her BP medication in 3 days. She will resume hydrodiuril. She may need additional medication if BP remains high. This can be decided at follow up appointment as an outpatient. Additionally, a high dose statin was added to her medical regimen. The patient has been seen by Dr. Martinique today and deemed stable for discharge home. All follow-up appointments have been scheduled. A prescription for a 30-day free supply of Brilinta was given to the patient. Discharge medications include ASA/Bilinta for at least one year, HCTZ 25mg , and Lipitor 80mg .   Addendum, It was noted at discharge that the patient was having HRs in the 130s-150s while ambulating. She was given 25 mg Lopressor with much better control. She will leave with 25mg  BID which can be re-evaluated at her follow up appointment as an outpatient.    Discharge Vitals: Blood pressure 164/68, pulse 81, temperature 97.9 F (36.6 C), temperature source Oral, resp. rate 19, height 5\' 3"  (1.6 m), weight 257 lb 0.9 oz (116.6 kg), SpO2 99.00%.  Labs: Lab Results  Component Value Date   WBC 7.3 09/29/2013   HGB 12.9 09/29/2013   HCT 38.5 09/29/2013   MCV 84.8 09/29/2013   PLT 251 09/29/2013     Recent Labs Lab 09/29/13 0555  NA 141  K 3.7  CL 102  CO2 26  BUN 10  CREATININE 0.97  CALCIUM 9.3  GLUCOSE 100*      Diagnostic Studies/Procedures   09/28/13  PERCUTANEOUS CORONARY INTERVENTION  Gina Solis is a  53 y.o. female  INDICATION: Primary Cardiologist: Rozann Lesches, M.D. Severe ostial LAD obstruction with concomitant abnormal myocardial perfusion study demonstrating a high risk setting with anterior wall ischemia   PROCEDURE: 1. Drug-eluting stent implantation proximal/ostial LAD  CONSENT:  The risks, benefits, and details of the procedure were explained to the patient. Risks including death, MI, stroke, bleeding, limb ischemia, emergency CABG, renal  failure and allergy were described and accepted by the patient. Informed written consent was obtained prior to proceeding.  PROCEDURE TECHNIQUE: After Xylocaine anesthesia a 6 French sheath was placed in the right femoral artery with a single anterior needle wall stick. Coronary guiding shots were made using a XB LAD 3.0 cm guide catheter. Antithrombotic therapy,bivalirudin bolus and infusion, was begun and determined to be therapeutic by ACT. Antiplatelet therapy, Brilinta 180 mg, was loaded.  A 2.5 x 15 mm emerge balloon was used for pre-dilatation. We then positioned and deployed to a 2.75 by 18 mm long Xience Alpine DES. The stent was deployed to 14 atmospheres. Postdilatation was then performed with a 15 x 3.25 mm [place] balloon to 13 atmospheres. I still felt that the stent was under sized. We therefore upgraded to a 3.5 x 15 [place] Emerge and post dilated to 14 atmospheres. The final angiographic result was felt to be appropriately matched for the vessel diameter. 0% stenosis was noted post stent deployment. TIMI grade 3 flow was noted. No immediate post procedure complications were noted .  CONTRAST: Total of 125 cc.  COMPLICATIONS: none.  ANGIOGRAPHIC RESULTS: Segmental ostial to proximal 85-90% LAD stenosis reduced to 0% with TIMI grade 3 flow. LAD, pulse still diameter was 3.5 mm at high pressure  IMPRESSIONS: 1. Successful ostial proximal LAD DES implantation with reduction in stenosis from 85% to 0% with TIMI grade 3 flow  RECOMMENDATION: Aspirin and Brilinta (or other dual antiplatelet therapy regimen)for greater than 12 months and possibly indefinitely.  Candidate for AM discharge if right radial and right femoral cath sites are unremarkable and the patient has had no ischemic complications.   Diagnostic cath: 09/28/2013  INDICATIONS: Class 2-3 angina and high-risk myocardial perfusion study.  PROCEDURE: 1. Diagnostic left heart catheterization; 2.Coronary angiography; 3. Left ventriculography    CONSENT:  The risks, benefits, and details of the procedure were explained in detail to the patient. Risks including death, stroke, heart attack, kidney injury, allergy, limb ischemia, bleeding and radiation injury were discussed. The patient verbalized understanding and wanted to proceed. Informed written consent was obtained.  PROCEDURE TECHNIQUE: After Xylocaine anesthesia a 5 French [place] sheath was placed in the right radial artery with a single anterior needle wall stick. Coronary angiography was done using a 5 [place] JR 4 and JL 3.5 diagnostic catheters. Left ventriculography was done using the 5 FrenchJR 4 catheter and hand injection.  We reviewed the digital images. We identified a high grade segmental LAD stenosis starting at the ostium and extending into the proximal vessel for proximally to 15 mm. Other vascular territories were widely patent. In the setting of previous left chest radiation, coronary bypass grafting with internal mammary artery use would have a high risk of potential healing concerns. After discussing the treatment options with the patient we decided to proceed with PCI. After awakening the patient to discuss the findings she develops severe radial/brachial spasm that made it very difficult to remove the diagnostic catheter and the guidewire. We spent some time attempting to recent date the patient and to reverse vasospasm but whenever able to advance  the guide catheter to the ascending aorta. For that reason we terminated the diagnostic procedure from the radial approach and converted to right femoral. Please see the separate PCI report.  MEDICATIONS: 3 mg Versed; 75 mcg fentanyl  CONTRAST: Total of 100 cc.  COMPLICATIONS: Severe radial artery spasm  HEMODYNAMICS: Aortic pressure 153/89 mmHg ; LV pressure 156/63mmHg; LVEDP 16 mmHg  ANGIOGRAPHIC DATA: The left main coronary artery is widely patent.  The left anterior descending artery is severely diseased starting at the  origin from the left main and extending 15 mm into the proximal segment. The LAD is obstructed up to 85%.  The left circumflex artery is large and gives origin to 2 large obtuse marginal branches.no significant obstruction is noted..  The right coronary artery is dominant and free of any significant obstruction.Marland Kitchen  LEFT VENTRICULOGRAM: Left ventricular angiogram was done in the 30 RAO projection and revealed LVEF 70% with symmetrical wall motion.  IMPRESSIONS: 1. Severe ostial LAD stenosis in a patient with prior left mastectomy and high-dose radiation.  2. Widely patent circumflex and right coronary  3. Normal left ventricular function  RECOMMENDATION: Have recommended that the patient undergo percutaneous intervention with stenting of the ostial to proximal LAD.   Dg Chest 2 View  09/08/2013   CLINICAL DATA:  Left-sided chest pain and left upper extremity numbness over the past 2 days. Shortness of breath. Productive cough. Left earache.  EXAM: CHEST  2 VIEW  COMPARISON:  CT CHEST W/CM dated 04/28/2013; DG CHEST 2 VIEW dated 04/24/2013; CT ABD/PELV WO CM dated 04/27/2012; DG CHEST 1V PORT dated 08/10/2010  FINDINGS: Cardiomediastinal silhouette unremarkable and unchanged. Lungs clear. Bronchovascular markings normal. Pulmonary vascularity normal. No visible pleural effusions. No pneumothorax. Left upper lobe nodule identified on prior CT not visualized on the chest x-ray. Prior left mastectomy and axillary node dissection. Visualized bony thorax intact.  IMPRESSION: No acute cardiopulmonary disease.    Nm Myocar Single W/spect W/wall Motion And Ef  09/26/2013   CLINICAL DATA:  53 year old woman with a history of breast cancer status post chemotherapy and radiation, hypertension, hyperlipidemia, and GERD, referred for the assessment of ischemia.  EXAM: MYOCARDIAL IMAGING WITH SPECT (REST AND EXERCISE)  GATED LEFT VENTRICULAR WALL MOTION STUDY  LEFT VENTRICULAR EJECTION FRACTION  TECHNIQUE: Standard  myocardial SPECT imaging was performed after resting intravenous injection of 10 mCi Tc-59m sestamibi. Subsequently, exercise tolerance test was performed by the patient under the supervision of the Cardiology staff. At peak-stress, 30 mCi Tc-72m sestamibi was injected intravenously and standard myocardial SPECT imaging was performed. Quantitative gated imaging was also performed to evaluate left ventricular wall motion, and estimate left ventricular ejection fraction.  COMPARISON:  None.  FINDINGS: Baseline tracing shows normal sinus rhythm with decreased R wave progression. Patient exercise on a Bruce protocol for 4 min and 31 seconds achieving a maximum work load of 7 METS. Peak heart rate was 164 beats per min which was 97% of the maximal age predicted heart rate response. Peak blood pressure was 199/88. Patient developed anginal chest pain during the study, and abnormal ST segment depression in leads II, III, AVF, and V4 through V6 consistent with ischemia. Lead motion artifact was also noted, there were no arrhythmias. ST segment changes became nondiagnostic by 5 min in recovery.  Analysis of the overall perfusion data finds adequate radiotracer uptake.  Tomographic views were obtained using the short axis, vertical long axis, and horizontal long axis planes. There is a moderate to large-sized, moderate to severe  intensity, anteroseptal wall defect that is partially reversible consistent with ischemia in the LAD distribution.  Gated imaging reveals an EDV of a 74, ESV of 30, TID ratio 0.85, and LVEF of 59% without wall motion abnormalities.  IMPRESSION: High risk abnormal exercise Cardiolite. Patient developed anginal chest pain and abnormal ST changes resulting in discontinuation of the study at a maximum work load of 7 METS and adequate heart rate response. There was a hypertensive response to exercise. Perfusion imaging is consistent with ischemia in the LAD distribution. LVEF is normal at 59% with normal  volumes.      ECHO  09/22/2013 LV EF: 60% - 65% ------------------------------------------------------------ Study Conclusions - Left ventricle: Moderate concentric and severe basal septal hypertrophy. The cavity size was normal. Systolic function was normal. The estimated ejection fraction was in the range of 60% to 65%. Wall motion was normal; there were no regional wall motion abnormalities. There was an increased relative contribution of atrial contraction to ventricular filling. Doppler parameters are consistent with abnormal left ventricular relaxation (grade 1 diastolic dysfunction). Doppler parameters are consistent with high ventricular filling pressure. - Mitral valve: Mild regurgitation. - Tricuspid valve: Mild regurgitation.   Discharge Medications     Medication List         acetaminophen 500 MG tablet  Commonly known as:  TYLENOL  Take 1,000 mg by mouth every 6 (six) hours as needed for mild pain.     aspirin 81 MG tablet  Take 1 tablet (81 mg total) by mouth daily.     atorvastatin 80 MG tablet  Commonly known as:  LIPITOR  Take 1 tablet (80 mg total) by mouth daily at 6 PM.     dexlansoprazole 60 MG capsule  Commonly known as:  DEXILANT  Take 1 capsule (60 mg total) by mouth daily.     diclofenac 75 MG EC tablet  Commonly known as:  VOLTAREN  Take 75 mg by mouth 2 (two) times daily as needed for mild pain.     hydrochlorothiazide 25 MG tablet  Commonly known as:  HYDRODIURIL  Take 1 tablet (25 mg total) by mouth daily.     lubiprostone 24 MCG capsule  Commonly known as:  AMITIZA  Take 24 mcg by mouth 2 (two) times daily as needed for constipation.     oxyCODONE-acetaminophen 5-325 MG per tablet  Commonly known as:  PERCOCET/ROXICET  Take 1 tablet by mouth every 4 (four) hours as needed for severe pain.     Ticagrelor 90 MG Tabs tablet  Commonly known as:  BRILINTA  Take 1 tablet (90 mg total) by mouth 2 (two) times daily.     traMADol 50  MG tablet  Commonly known as:  ULTRAM  Take 50 mg by mouth every 6 (six) hours as needed for moderate pain.        Disposition   The patient will be discharged in stable condition to home.  Future Appointments Provider Department Dept Phone   10/10/2013 8:00 AM West[facility] Pam [facility] [phone]   10/13/2013 1:30 PM Lendon Colonel, NP [facility] [phone]     Follow-up Information   Follow up with Jory Sims, NP On 10/13/2013. (@1 :30pm)    Specialty:  Nurse Practitioner   Contact information:   [address] [place] [address] [phone]         Duration of Discharge Encounter: Greater than 30 minutes including physician and PA time.  Signed, Perry Mount PA-C  09/29/2013, 8:35 AM

## 2013-10-02 ENCOUNTER — Encounter (HOSPITAL_BASED_OUTPATIENT_CLINIC_OR_DEPARTMENT_OTHER): Admission: RE | Payer: Self-pay | Source: Ambulatory Visit

## 2013-10-02 ENCOUNTER — Ambulatory Visit (HOSPITAL_BASED_OUTPATIENT_CLINIC_OR_DEPARTMENT_OTHER): Admission: RE | Admit: 2013-10-02 | Payer: Medicare Other | Source: Ambulatory Visit | Admitting: Specialist

## 2013-10-02 SURGERY — BREAST RECONSTRUCTION WITH PLACEMENT OF TISSUE EXPANDER AND FLEX HD (ACELLULAR HYDRATED DERMIS)
Anesthesia: General | Site: Breast | Laterality: Left

## 2013-10-10 ENCOUNTER — Ambulatory Visit: Payer: Medicare Other | Admitting: Gastroenterology

## 2013-10-12 ENCOUNTER — Telehealth: Payer: Self-pay | Admitting: Cardiovascular Disease

## 2013-10-12 ENCOUNTER — Telehealth: Payer: Self-pay | Admitting: Adult Health

## 2013-10-12 NOTE — Telephone Encounter (Signed)
I left message for pt to touch base with her pcp to inquire about medication for her neck pain.It appears she is taking Voltaren and Tramadol

## 2013-10-12 NOTE — Telephone Encounter (Signed)
Patient has questions regarding medications that she can take for pain in her neck. / tgs

## 2013-10-13 ENCOUNTER — Encounter: Payer: Medicare Other | Admitting: Adult Health

## 2013-10-13 NOTE — Telephone Encounter (Signed)
Left message I called,call me back.  

## 2013-10-13 NOTE — Telephone Encounter (Signed)
Pt called to let me know had to reschedule appt with Dr Oneida Alar and that after her stress test had to have stent to in heart had blockage,? Related to radaition, doing well, just sore, has appt here in April.

## 2013-10-13 NOTE — Progress Notes (Signed)
HPI: Gina Solis is a 53 year old patient of Dr. Domenic Polite, we are following for ongoing assessment and management of chest discomfort, hypertension, with history of dyspnea on exertion. She recent echocardiogram in February of 2015 revealing moderate LVH with severe basal septal hypertrophy, LVEF of 65% with grade 1 diastolic dysfunction, mild mitral regurg, and mild tricuspid regurg.    Exercise Cardiolite in March of 2000 again showing abnormal ST segment changes consistent with ischemia, anginal chest pain, and hypertensive response. The perfusion evidence of LAD distress fusion ischemia was noted. EF of 59%. She was subsequently referred for cardiac catheterization on 09/28/2013 this revealed a near ostial LAD stenosis, and the patient with a prior left mastectomy and high dose radiation. White lead paint in circumflex and right coronary artery, normal left ventricular function. The patient was recommended for percutaneous intervention of the proximal LAD. She proceeded to successful ostial proximal LAD DES implantation with reduction in stenosis from 85% to 0% with TIMI grade 3 flow.   She had evidence of right radial artery spasm during procedure. Had some soreness remaining thereafter. She was sent home onASA/Bilinta for at least one year, HCTZ 25mg , and Lipitor 80mg . She is here for post hospitalization, interventional followup.       Allergies  Allergen Reactions  . Codeine Nausea Only    Current Outpatient Prescriptions  Medication Sig Dispense Refill  . acetaminophen (TYLENOL) 500 MG tablet Take 1,000 mg by mouth every 6 (six) hours as needed for mild pain.       Marland Kitchen aspirin 81 MG tablet Take 1 tablet (81 mg total) by mouth daily.      Marland Kitchen atorvastatin (LIPITOR) 80 MG tablet Take 1 tablet (80 mg total) by mouth daily at 6 PM.  30 tablet  6  . dexlansoprazole (DEXILANT) 60 MG capsule Take 1 capsule (60 mg total) by mouth daily.  30 capsule  6  . diclofenac (VOLTAREN) 75 MG EC tablet Take  75 mg by mouth 2 (two) times daily as needed for mild pain.      . hydrochlorothiazide (HYDRODIURIL) 25 MG tablet Take 1 tablet (25 mg total) by mouth daily.  30 tablet  3  . lubiprostone (AMITIZA) 24 MCG capsule Take 24 mcg by mouth 2 (two) times daily as needed for constipation.      . metoprolol tartrate (LOPRESSOR) 25 MG tablet Take 1 tablet (25 mg total) by mouth 2 (two) times daily.  60 tablet  6  . oxyCODONE-acetaminophen (PERCOCET/ROXICET) 5-325 MG per tablet Take 1 tablet by mouth every 4 (four) hours as needed for severe pain.  10 tablet  0  . Ticagrelor (BRILINTA) 90 MG TABS tablet Take 1 tablet (90 mg total) by mouth 2 (two) times daily.  60 tablet  11  . traMADol (ULTRAM) 50 MG tablet Take 50 mg by mouth every 6 (six) hours as needed for moderate pain.        No current facility-administered medications for this visit.    Past Medical History  Diagnosis Date  . Breast cancer 2008    a. diagnosed at stage IIIB  . Dermatomyositis   . Essential hypertension, benign   . Hypercholesteremia   . Arthritis   . GERD (gastroesophageal reflux disease)   . Lymphedema of arm     a. in left arm  . Multiple thyroid nodules   . 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 CAD (coronary artery disease)     a.  s/p DES to LAD (09/29/13)  . History of echocardiogram     a. mod concentric and severe basal septal hypertrophy. EF 60-65%, no WMA, g1dd. Mild MR/TR    Past Surgical History  Procedure Laterality Date  . Abdominal hysterectomy    . Tubal ligation  1985  . Esophagogastroduodenoscopy  05/02/2007    QAS:TMHDQ hiatal hernia, otherwise normal stomach./Normal duodenal bulb and second portion of the duodenum/White plaques seen from the proximal to the distal esophagus/ Shallow ulcerations with mild erythema seen scattered throughout the esophagus most pronounced in the distal esophagus.  HSV1  . Colonoscopy with esophagogastroduodenoscopy (egd)  07/15/2012    Procedure:  COLONOSCOPY WITH ESOPHAGOGASTRODUODENOSCOPY (EGD);  Surgeon: [name] Binder, MD;  Location: [facility];  Service: Endoscopy;  Laterality: N/A;  9:30  . Breast reconstruction  left    X 2, trying again in spring.   . Mastectomy, partial  2008    Left-lumpectomy-axillary nodes x14    ROS: PHYSICAL EXAM There were no vitals taken for this visit.  EKG:  ASSESSMENT AND PLAN

## 2013-10-16 ENCOUNTER — Ambulatory Visit (INDEPENDENT_AMBULATORY_CARE_PROVIDER_SITE_OTHER): Payer: Medicare Other | Admitting: Adult Health

## 2013-10-16 ENCOUNTER — Encounter: Payer: Self-pay | Admitting: Adult Health

## 2013-10-16 VITALS — BP 136/78 | HR 88 | Ht 63.0 in | Wt 258.0 lb

## 2013-10-16 DIAGNOSIS — I10 Essential (primary) hypertension: Secondary | ICD-10-CM

## 2013-10-16 DIAGNOSIS — I2510 Atherosclerotic heart disease of native coronary artery without angina pectoris: Secondary | ICD-10-CM

## 2013-10-16 DIAGNOSIS — E785 Hyperlipidemia, unspecified: Secondary | ICD-10-CM

## 2013-10-16 NOTE — Patient Instructions (Signed)
Your physician recommends that you schedule a follow-up appointment in: 3 months. Your physician recommends that you continue on your current medications as directed. Please refer to the Current Medication list given to you today. 

## 2013-10-16 NOTE — Assessment & Plan Note (Signed)
I have spent 25 minutes with this patient going over the location of her stent placement, medication treatment with DAPT, and need to take it daily, along with her atorvastatin. She was then referred to cardiac rehabilitation at [facility]. The patient does verbalize understanding concerning her medication compliance. She is able to afford Brililnta. She has been given a drug card to assist with her ability to get the medication.  She has had been advised to call us for any complaints of recurrent chest pain similar to what she experienced prior to his stent placements. She also is to call his she has any issues of bleeding, recurrent shortness of breath, or dizziness. We will see her again in 3 months unless she becomes symptomatic. He is my hope that she will change her lifestyle and lose some weight to assist with overall health status and prevention of progressive CAD.

## 2013-10-16 NOTE — Progress Notes (Deleted)
Name: Gina Solis    DOB: [birth year]  Age: 53 y.o.  MR#: [id number]       PCP:  Vic Blackbird, MD      Insurance: Payor: MEDICARE / Plan: MEDICARE PART A AND B / Product Type: *No Product type* /   CC:   No chief complaint on file.   VS Filed Vitals:   10/16/13 1531  BP: 136/78  Pulse: 88  Height: 5\' 3"  (1.6 m)  Weight: 258 lb (117.028 kg)    Weights Current Weight  10/16/13 258 lb (117.028 kg)  09/29/13 257 lb 0.9 oz (116.6 kg)  09/29/13 257 lb 0.9 oz (116.6 kg)    Blood Pressure  BP Readings from Last 3 Encounters:  10/16/13 136/78  09/29/13 164/84  09/29/13 164/84     Admit date:  (Not on file) Last encounter with RMR:  Visit date not found   Allergy Codeine  Current Outpatient Prescriptions  Medication Sig Dispense Refill  . acetaminophen (TYLENOL) 500 MG tablet Take 1,000 mg by mouth every 6 (six) hours as needed for mild pain.       Marland Kitchen aspirin 81 MG tablet Take 1 tablet (81 mg total) by mouth daily.      Marland Kitchen atorvastatin (LIPITOR) 80 MG tablet Take 1 tablet (80 mg total) by mouth daily at 6 PM.  30 tablet  6  . dexlansoprazole (DEXILANT) 60 MG capsule Take 1 capsule (60 mg total) by mouth daily.  30 capsule  6  . diclofenac (VOLTAREN) 75 MG EC tablet Take 75 mg by mouth 2 (two) times daily as needed for mild pain.      . hydrochlorothiazide (HYDRODIURIL) 25 MG tablet Take 1 tablet (25 mg total) by mouth daily.  30 tablet  3  . lubiprostone (AMITIZA) 24 MCG capsule Take 24 mcg by mouth 2 (two) times daily as needed for constipation.      . metoprolol tartrate (LOPRESSOR) 25 MG tablet Take 1 tablet (25 mg total) by mouth 2 (two) times daily.  60 tablet  6  . oxyCODONE-acetaminophen (PERCOCET/ROXICET) 5-325 MG per tablet Take 1 tablet by mouth every 4 (four) hours as needed for severe pain.  10 tablet  0  . Ticagrelor (BRILINTA) 90 MG TABS tablet Take 1 tablet (90 mg total) by mouth 2 (two) times daily.  60 tablet  11  . traMADol (ULTRAM) 50 MG tablet Take 50 mg by  mouth every 6 (six) hours as needed for moderate pain.        No current facility-administered medications for this visit.    Discontinued Meds:   There are no discontinued medications.  Patient Active Problem List   Diagnosis Date Noted  . CAD (coronary artery disease)   . Unstable angina   . History of echocardiogram   . Other and unspecified angina pectoris 09/28/2013  . Abnormal myocardial perfusion study 09/27/2013  . Accelerating angina 09/20/2013  . SOB (shortness of breath) on exertion 04/24/2013  . Prolonged QT interval 04/24/2013  . Leg edema 04/24/2013  . Laryngitis 11/04/2012  . Dermatomyositis 09/20/2012  . Alopecia 07/26/2012  . GERD (gastroesophageal reflux disease) 06/21/2012  . Hemorrhoids 11/20/2011  . Laryngopharyngeal reflux (LPR) 11/20/2011  . LYMPHEDEMA, LEFT ARM 09/04/2008  . THYROID NODULE 04/05/2007  . HYPERLIPIDEMIA 02/22/2007  . Morbid obesity 02/22/2007  . HYPERTENSION 02/22/2007  . GERD 02/22/2007  . BREAST CANCER, HX OF 02/22/2007    LABS    Component Value Date/Time   NA 141 09/29/2013  0555   NA 141 09/28/2013 0830   NA 138 09/08/2013 0858   K 3.7 09/29/2013 0555   K 3.7 09/28/2013 0830   K 3.5* 09/08/2013 0858   CL 102 09/29/2013 0555   CL 102 09/28/2013 0830   CL 101 09/08/2013 0858   CO2 26 09/29/2013 0555   CO2 24 09/28/2013 0830   CO2 25 09/08/2013 0858   GLUCOSE 100* 09/29/2013 0555   GLUCOSE 89 09/28/2013 0830   GLUCOSE 98 09/08/2013 0858   BUN 10 09/29/2013 0555   BUN 16 09/28/2013 0830   BUN 14 09/08/2013 0858   CREATININE 0.97 09/29/2013 0555   CREATININE 0.97 09/28/2013 0830   CREATININE 1.04 09/08/2013 0858   CREATININE 1.10 04/24/2013 0853   CREATININE 1.02 03/06/2013 1103   CALCIUM 9.3 09/29/2013 0555   CALCIUM 9.6 09/28/2013 0830   CALCIUM 9.2 09/08/2013 0858   GFRNONAA 66* 09/29/2013 0555   GFRNONAA 66* 09/28/2013 0830   GFRNONAA 61* 09/08/2013 0858   GFRAA 76* 09/29/2013 0555   GFRAA 76* 09/28/2013 0830   GFRAA 70* 09/08/2013 0858   CMP      Component Value Date/Time   NA 141 09/29/2013 0555   K 3.7 09/29/2013 0555   CL 102 09/29/2013 0555   CO2 26 09/29/2013 0555   GLUCOSE 100* 09/29/2013 0555   BUN 10 09/29/2013 0555   CREATININE 0.97 09/29/2013 0555   CREATININE 1.10 04/24/2013 0853   CALCIUM 9.3 09/29/2013 0555   PROT 7.3 03/06/2013 1103   ALBUMIN 4.1 03/06/2013 1103   AST 12 03/06/2013 1103   ALT 11 03/06/2013 1103   ALKPHOS 75 03/06/2013 1103   BILITOT 0.4 03/06/2013 1103   GFRNONAA 66* 09/29/2013 0555   GFRAA 76* 09/29/2013 0555       Component Value Date/Time   WBC 7.3 09/29/2013 0555   WBC 6.8 09/28/2013 0830   WBC 6.1 09/08/2013 0858   HGB 12.9 09/29/2013 0555   HGB 12.8 09/28/2013 0830   HGB 12.8 09/08/2013 0858   HCT 38.5 09/29/2013 0555   HCT 38.9 09/28/2013 0830   HCT 39.2 09/08/2013 0858   MCV 84.8 09/29/2013 0555   MCV 85.5 09/28/2013 0830   MCV 86.2 09/08/2013 0858    Lipid Panel     Component Value Date/Time   CHOL 214* 03/06/2013 1103   TRIG 117 03/06/2013 1103   HDL 45 03/06/2013 1103   CHOLHDL 4.8 03/06/2013 1103   VLDL 23 03/06/2013 1103   LDLCALC 146* 03/06/2013 1103    ABG    Component Value Date/Time   TCO2 28 04/14/2010 0659     Lab Results  Component Value Date   TSH 0.873 03/06/2013   BNP (last 3 results) No results found for this basename: PROBNP,  in the last 8760 hours Cardiac Panel (last 3 results) No results found for this basename: CKTOTAL, CKMB, TROPONINI, RELINDX,  in the last 72 hours  Iron/TIBC/Ferritin No results found for this basename: iron, tibc, ferritin     EKG Orders placed during the hospital encounter of 09/28/13  . EKG 12-LEAD  . EKG 12-LEAD  . EKG 12-LEAD  . EKG 12-LEAD     Prior Assessment and Plan Problem List as of 10/16/2013     Cardiovascular and Mediastinum   Accelerating angina   Last Assessment & Plan   09/27/2013 Office Visit Written 09/27/2013  9:21 AM by Satira Sark, MD     Exertional, noted over the last 2-3 weeks. Cardiolite study from yesterday  was abnormal as  detailed above, consistent with ischemia in the LAD distribution. She is being scheduled for a cardiac catheterization with Dr. Tamala Julian tomorrow to assess for revascularization options. Lab work to be obtained on arrival to short stay.    HYPERTENSION   Last Assessment & Plan   09/27/2013 Office Visit Written 09/27/2013  9:22 AM by Satira Sark, MD     Moderate LVH with severe basal septal hypertrophy by recent echocardiogram. She is followed by Dr. Buelah Manis. At this point only on hydrochlorothiazide. May need additional agents.    Hemorrhoids   Prolonged QT interval   Last Assessment & Plan   04/24/2013 Office Visit Written 04/24/2013  9:12 PM by Alycia Rossetti, MD     Refer to cardiology, mag level, lytes, TSH normal No other new meds, does not take HCTZ on regular basis Risk factors are HTN, morbid obesity, mild hyperlipidemia    Other and unspecified angina pectoris   CAD (coronary artery disease)   Unstable angina     Respiratory   Laryngopharyngeal reflux (LPR)   Last Assessment & Plan   11/20/2011 Office Visit Written 11/20/2011 11:35 AM by Mahala Menghini, PA     ?hoarseness secondary to LPR based on her description of ENT evaluation. Never tried PPI BID. Worth trying increase omeprazole for 3 months but if no significant improvement in hoarseness then she will go back to once daily dosing.    Laryngitis   Last Assessment & Plan   11/04/2012 Office Visit Written 11/04/2012 10:23 AM by Alycia Rossetti, MD     Supportive care, throat lozenges      Digestive   GERD   Last Assessment & Plan   06/17/2012 Office Visit Written 06/17/2012 12:45 PM by Alycia Rossetti, MD     Seen by GI continue PPI    GERD (gastroesophageal reflux disease)   Last Assessment & Plan   06/21/2012 Office Visit Written 06/21/2012  3:39 PM by Orvil Feil, NP      Chronic, improvement of symptoms with PPI BID. However, notes intermittent dysphagia. Last EGD in remote past by Dr. Oneida Alar, 2008. Dysphagia may  be secondary to uncontrolled GERD, query web, ring, or stricture.   Continue Prilosec BID Proceed with upper endoscopy in the near future with Dr. Oneida Alar. The risks, benefits, and alternatives have been discussed in detail with patient. They have stated understanding and desire to proceed.        Endocrine   THYROID NODULE     Musculoskeletal and Integument   Alopecia   Last Assessment & Plan   07/25/2012 Office Visit Written 07/26/2012  9:25 PM by Alycia Rossetti, MD     Referral to dermatology, advised nizoral shampoo, she seems to have a scarring type of alopecia, hair growth may be very limited    Dermatomyositis     Other   HYPERLIPIDEMIA   Last Assessment & Plan   09/27/2013 Office Visit Written 09/27/2013  9:23 AM by Satira Sark, MD     Previous LDL 146 last year. Will need to consider statin therapy, particularly if ischemic heart disease is present.    Morbid obesity   Last Assessment & Plan   06/17/2012 Office Visit Written 06/17/2012 12:44 PM by Alycia Rossetti, MD     Discuss need for weight loss and healthy eating    LYMPHEDEMA, LEFT ARM   BREAST CANCER, HX OF   Last Assessment & Plan   06/16/2013 Office  Visit Written 06/16/2013  2:15 PM by Satira Sark, MD     Infiltrating ductal carcinoma, stage IIIB, status post chemotherapy, radiation, and surgery as outlined.    SOB (shortness of breath) on exertion   Last Assessment & Plan   06/16/2013 Office Visit Written 06/16/2013  2:13 PM by Satira Sark, MD     Outlined above, no clearly associated chest pain, has a vague heaviness in the left side of her chest sometimes when she lies down. ECG is nonspecific. Has prior history of breast cancer with radiation and chemotherapy as well as reconstructive surgery as noted above. Plan will be to obtain an echocardiogram to followup on cardiac structure and function, also an exercise Cardiolite for ischemic evaluation. We will plan to inform her of the results.     Leg edema   Abnormal myocardial perfusion study   Last Assessment & Plan   09/27/2013 Office Visit Written 09/27/2013  9:21 AM by Satira Sark, MD     Anginal chest pain with abnormal ST changes and perfusion evidence of LAD ischemia, LVEF 59%.    History of echocardiogram       Imaging: Nm Myocar Single W/spect W/wall Motion And Ef  09/26/2013   CLINICAL DATA:  53 year old woman with a history of breast cancer status post chemotherapy and radiation, hypertension, hyperlipidemia, and GERD, referred for the assessment of ischemia.  EXAM: MYOCARDIAL IMAGING WITH SPECT (REST AND EXERCISE)  GATED LEFT VENTRICULAR WALL MOTION STUDY  LEFT VENTRICULAR EJECTION FRACTION  TECHNIQUE: Standard myocardial SPECT imaging was performed after resting intravenous injection of 10 mCi Tc-54m sestamibi. Subsequently, exercise tolerance test was performed by the patient under the supervision of the Cardiology staff. At peak-stress, 30 mCi Tc-58m sestamibi was injected intravenously and standard myocardial SPECT imaging was performed. Quantitative gated imaging was also performed to evaluate left ventricular wall motion, and estimate left ventricular ejection fraction.  COMPARISON:  None.  FINDINGS: Baseline tracing shows normal sinus rhythm with decreased R wave progression. Patient exercise on a Bruce protocol for 4 min and 31 seconds achieving a maximum work load of 7 METS. Peak heart rate was 164 beats per min which was 97% of the maximal age predicted heart rate response. Peak blood pressure was 199/88. Patient developed anginal chest pain during the study, and abnormal ST segment depression in leads II, III, AVF, and V4 through V6 consistent with ischemia. Lead motion artifact was also noted, there were no arrhythmias. ST segment changes became nondiagnostic by 5 min in recovery.  Analysis of the overall perfusion data finds adequate radiotracer uptake.  Tomographic views were obtained using the short axis, vertical  long axis, and horizontal long axis planes. There is a moderate to large-sized, moderate to severe intensity, anteroseptal wall defect that is partially reversible consistent with ischemia in the LAD distribution.  Gated imaging reveals an EDV of a 74, ESV of 30, TID ratio 0.85, and LVEF of 59% without wall motion abnormalities.  IMPRESSION: High risk abnormal exercise Cardiolite. Patient developed anginal chest pain and abnormal ST changes resulting in discontinuation of the study at a maximum work load of 7 METS and adequate heart rate response. There was a hypertensive response to exercise. Perfusion imaging is consistent with ischemia in the LAD distribution. LVEF is normal at 59% with normal volumes.   Electronically Signed   By: Rozann Lesches M.D.   On: 09/26/2013 15:02

## 2013-10-16 NOTE — Progress Notes (Signed)
HPI: [name] is a very pleasant morbidly obese female patient of Dr. Domenic Polite were following posthospitalization after admission for cardiac catheterization in the setting of an abnormal stress Myoview. The patient was complaining of left-sided chest pain radiating to her shoulder and neck with associated shortness of breath. She was seen initially by Dr. Domenic Polite in March of 2015. An exercise Cardiolite on March 3 revealed abnormalities showing ST segment changes consistent with ischemia, anginal chest pain, with hypertensive response, and perfusion evidence of LAD distribution ischemia with an LVEF of 59%.    The patient was referred to [facility], where she had a cardiac catheterization completed 5 2015. This revealed severe ostial LAD obstruction which was successfully treated with drug-eluting stent to the proximal and ostial LAD. She was started on dual antiplatelet therapy with aspirin and Brlinta, which is to be continued for 12 months, and possibly indefinitely.   It was also noted, just prior to discharge, her heart rates are running between 1300-150 bpmt while ambulating with cardiac rehabilitation. She was started on metoprolol 25 mg twice a day. She was referred to cardiac rehabilitation at [facility] post discharge.   Since discharge she has been feeling much better, no recurrent chest pain, no shortness of breath. She is a little fatigued but she feels is related to medication. She has been started on atorvastatin 80 mg daily and denies any myalgias.  Allergies  Allergen Reactions  . Codeine Nausea Only    Current Outpatient Prescriptions  Medication Sig Dispense Refill  . acetaminophen (TYLENOL) 500 MG tablet Take 1,000 mg by mouth every 6 (six) hours as needed for mild pain.       Marland Kitchen aspirin 81 MG tablet Take 1 tablet (81 mg total) by mouth daily.      Marland Kitchen atorvastatin (LIPITOR) 80 MG tablet Take 1 tablet (80 mg total) by mouth daily at 6 PM.  30 tablet  6  .  dexlansoprazole (DEXILANT) 60 MG capsule Take 1 capsule (60 mg total) by mouth daily.  30 capsule  6  . diclofenac (VOLTAREN) 75 MG EC tablet Take 75 mg by mouth 2 (two) times daily as needed for mild pain.      . hydrochlorothiazide (HYDRODIURIL) 25 MG tablet Take 1 tablet (25 mg total) by mouth daily.  30 tablet  3  . lubiprostone (AMITIZA) 24 MCG capsule Take 24 mcg by mouth 2 (two) times daily as needed for constipation.      . metoprolol tartrate (LOPRESSOR) 25 MG tablet Take 1 tablet (25 mg total) by mouth 2 (two) times daily.  60 tablet  6  . oxyCODONE-acetaminophen (PERCOCET/ROXICET) 5-325 MG per tablet Take 1 tablet by mouth every 4 (four) hours as needed for severe pain.  10 tablet  0  . Ticagrelor (BRILINTA) 90 MG TABS tablet Take 1 tablet (90 mg total) by mouth 2 (two) times daily.  60 tablet  11  . traMADol (ULTRAM) 50 MG tablet Take 50 mg by mouth every 6 (six) hours as needed for moderate pain.        No current facility-administered medications for this visit.    Past Medical History  Diagnosis Date  . Breast cancer 2008    a. diagnosed at stage IIIB  . Dermatomyositis   . Essential hypertension, benign   . Hypercholesteremia   . Arthritis   . GERD (gastroesophageal reflux disease)   . Lymphedema of arm     a. in left arm  .  Multiple thyroid nodules   . 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 CAD (coronary artery disease)     a. s/p DES to LAD (09/29/13)  . History of echocardiogram     a. mod concentric and severe basal septal hypertrophy. EF 60-65%, no WMA, g1dd. Mild MR/TR    Past Surgical History  Procedure Laterality Date  . Abdominal hysterectomy    . Tubal ligation  1985  . Esophagogastroduodenoscopy  05/02/2007    KGY:JEHUD hiatal hernia, otherwise normal stomach./Normal duodenal bulb and second portion of the duodenum/White plaques seen from the proximal to the distal esophagus/ Shallow ulcerations with mild erythema seen scattered  throughout the esophagus most pronounced in the distal esophagus.  HSV1  . Colonoscopy with esophagogastroduodenoscopy (egd)  07/15/2012    Procedure: COLONOSCOPY WITH ESOPHAGOGASTRODUODENOSCOPY (EGD);  Surgeon: [name] Binder, MD;  Location: [facility];  Service: Endoscopy;  Laterality: N/A;  9:30  . Breast reconstruction  left    X 2, trying again in spring.   . Mastectomy, partial  2008    Left-lumpectomy-axillary nodes x14    JSH:FWYOVZ of systems complete and found to be negative unless listed above  PHYSICAL EXAM BP 136/78  Pulse 88  Ht 5\' 3"  (1.6 m)  Wt 258 lb (117.028 kg)  BMI 45.71 kg/m2  General: Well developed, well nourished, in no acute distress, obese. Head: Eyes PERRLA, No xanthomas.   Normal cephalic and atramatic  Lungs: Clear bilaterally to auscultation and percussion. Heart: HRRR S1 S2, without MRG.  Pulses are 2+ & equal.            No carotid bruit. No JVD.  No abdominal bruits. No femoral bruits. Abdomen: Bowel sounds are positive, abdomen soft and non-tender without masses or                  Hernia's noted. Msk:  Back normal, normal gait. Normal strength and tone for age. Extremities: No clubbing, cyanosis or edema.  DP +1 Neuro: Alert and oriented X 3. Psych:  Good affect, responds appropriately    ASSESSMENT AND PLAN

## 2013-10-16 NOTE — Assessment & Plan Note (Signed)
She is recently been started on atorvastatin 80 mg daily. Our plan is to continue her on this medication and check lipids and LFTs in 3 months on next appointment. She has been advised on low cholesterol diet. And will have reinforcement of this with cardiac rehabilitation when she begins the program.

## 2013-10-16 NOTE — Assessment & Plan Note (Signed)
Blood pressure is well-controlled currently. She continues on metoprolol 25 mg twice a day heart rate is running in the 80s. She denies any dizziness, bronchospasms, or coughing. We will keep her on current medication regimen as directed, and followup with her in 3 months with Dr. Domenic Polite for ongoing management.

## 2013-10-30 ENCOUNTER — Ambulatory Visit: Payer: Medicare Other | Admitting: Family Medicine

## 2013-11-02 ENCOUNTER — Ambulatory Visit (INDEPENDENT_AMBULATORY_CARE_PROVIDER_SITE_OTHER): Payer: Medicare Other | Admitting: Gastroenterology

## 2013-11-02 ENCOUNTER — Encounter: Payer: Self-pay | Admitting: Gastroenterology

## 2013-11-02 VITALS — BP 130/75 | HR 76 | Temp 97.6°F | Ht 63.0 in | Wt 258.2 lb

## 2013-11-02 DIAGNOSIS — K219 Gastro-esophageal reflux disease without esophagitis: Secondary | ICD-10-CM

## 2013-11-02 DIAGNOSIS — K5900 Constipation, unspecified: Secondary | ICD-10-CM

## 2013-11-02 DIAGNOSIS — I2510 Atherosclerotic heart disease of native coronary artery without angina pectoris: Secondary | ICD-10-CM

## 2013-11-02 DIAGNOSIS — J387 Other diseases of larynx: Secondary | ICD-10-CM

## 2013-11-02 MED ORDER — LUBIPROSTONE 24 MCG PO CAPS: 24.0000 ug | ORAL_CAPSULE | Freq: Two times a day (BID) | ORAL | Status: AC | PRN

## 2013-11-02 NOTE — Assessment & Plan Note (Signed)
Managed with Amitiza. New prescription provided.

## 2013-11-02 NOTE — Progress Notes (Signed)
Primary Care Physician: Vic Blackbird, MD  Primary Gastroenterologist:  Barney Drain, MD   Chief Complaint  Patient presents with  . Gastrophageal Reflux    HPI: Gina Solis is a 53 y.o. female here for further evaluation of recent chest pain. She was referred by [facility]. States she developed worsening exertional chest pain back in February. She also had some burning type sensation and thought it was reflux and initially. Actually seen in the emergency department in February and at that time troponin was normal an EKG showed no acute ST segment changes. Subsequent echo showed moderate LVH with severe basal septal hypertrophy, LVEF of 01-75%, grade 1 diastolic dysfunction, mild MR/TR. Exercise Cardiolite from March 3 showed abnormal ST segment changes consistent with ischemia and perfusion evidence of LAD distribution ischemia with LVEF of 59%. Went on to have cardiac catheterization which revealed severe ostial LAD obstruction treated successfully with DES implantation to the proximal/ostial LAD. She was started on aspirin and Brilinta.  Since stenting, her chest pain has resolved. Really denies any significant heartburn symptoms. Notably in the beginning she was switched from omeprazole to [place]. No significant swallowing issues. Denies odynophagia. Appetite is good. Bowel movements are regular with Amitiza. Denies melena or rectal bleeding. Continues to have chronic intermittent hoarseness of more than 3 years duration, previously evaluated by ENT.   Current Outpatient Prescriptions  Medication Sig Dispense Refill  . acetaminophen (TYLENOL) 500 MG tablet Take 1,000 mg by mouth every 6 (six) hours as needed for mild pain.       Marland Kitchen aspirin 81 MG tablet Take 1 tablet (81 mg total) by mouth daily.      Marland Kitchen atorvastatin (LIPITOR) 80 MG tablet Take 1 tablet (80 mg total) by mouth daily at 6 PM.  30 tablet  6  . dexlansoprazole (DEXILANT) 60 MG capsule Take 1 capsule (60 mg total)  by mouth daily.  30 capsule  6  . diclofenac (VOLTAREN) 75 MG EC tablet Take 75 mg by mouth 2 (two) times daily as needed for mild pain.      . hydrochlorothiazide (HYDRODIURIL) 25 MG tablet Take 1 tablet (25 mg total) by mouth daily.  30 tablet  3  . lubiprostone (AMITIZA) 24 MCG capsule Take 24 mcg by mouth 2 (two) times daily as needed for constipation.      . metoprolol tartrate (LOPRESSOR) 25 MG tablet Take 1 tablet (25 mg total) by mouth 2 (two) times daily.  60 tablet  6  . Ticagrelor (BRILINTA) 90 MG TABS tablet Take 1 tablet (90 mg total) by mouth 2 (two) times daily.  60 tablet  11  . traMADol (ULTRAM) 50 MG tablet Take 50 mg by mouth every 6 (six) hours as needed for moderate pain.       Marland Kitchen oxyCODONE-acetaminophen (PERCOCET/ROXICET) 5-325 MG per tablet Take 1 tablet by mouth every 4 (four) hours as needed for severe pain.  10 tablet  0   No current facility-administered medications for this visit.    Allergies as of 11/02/2013 - Review Complete 11/02/2013  Allergen Reaction Noted  . Codeine Nausea Only 11/20/2011    ROS:  General: Negative for anorexia, weight loss, fever, chills, fatigue, weakness. ENT: Negative for hoarseness, difficulty swallowing , nasal congestion. CV: Negative for chest pain, angina, palpitations, dyspnea on exertion, peripheral edema.  Respiratory: Negative for dyspnea at rest, dyspnea on exertion, cough, sputum, wheezing.  GI: See history of present illness. GU:  Negative for dysuria,  hematuria, urinary incontinence, urinary frequency, nocturnal urination.  Endo: Negative for unusual weight change.    Physical Examination:   BP 130/75  Pulse 76  Temp(Src) 97.6 F (36.4 C) (Oral)  Ht 5\' 3"  (1.6 m)  Wt 258 lb 3.2 oz (117.119 kg)  BMI 45.75 kg/m2  General: Well-nourished, well-developed in no acute distress.  Eyes: No icterus. Mouth: Oropharyngeal mucosa moist and pink , no lesions erythema or exudate. Lungs: Clear to auscultation bilaterally.    Heart: Regular rate and rhythm, no murmurs rubs or gallops.  Abdomen: Bowel sounds are normal, nontender, nondistended, no hepatosplenomegaly or masses, no abdominal bruits or hernia , no rebound or guarding.   Extremities: No lower extremity edema. No clubbing or deformities. Neuro: Alert and oriented x 4   Skin: Warm and dry, no jaundice.   Psych: Alert and cooperative, normal mood and affect.   Lab Results  Component Value Date   WBC 7.3 09/29/2013   HGB 12.9 09/29/2013   HCT 38.5 09/29/2013   MCV 84.8 09/29/2013   PLT 251 09/29/2013   Lab Results  Component Value Date   CREATININE 0.97 09/29/2013   BUN 10 09/29/2013   NA 141 09/29/2013   K 3.7 09/29/2013   CL 102 09/29/2013   CO2 26 09/29/2013

## 2013-11-02 NOTE — Patient Instructions (Signed)
1. Continue Dexilant one daily for GERD. 2. Amitiza one twice daily with food for constipation. RX sent to [facility]. 3. Office visit in one year or sooner if needed.

## 2013-11-02 NOTE — Progress Notes (Signed)
cc'd to pcp 

## 2013-11-02 NOTE — Assessment & Plan Note (Addendum)
Doing well at this time. Chest pain resolved status post LAD stent. Chronic hoarseness previously evaluated by ENT several years ago. Etiology unclear. May benefit from [place] given dual release mechanism. May take up to 2-3 months to notice significant benefit of ?LPR.  Office visit in one year or sooner if needed

## 2013-11-03 ENCOUNTER — Ambulatory Visit (INDEPENDENT_AMBULATORY_CARE_PROVIDER_SITE_OTHER): Payer: Medicare Other | Admitting: Adult Health

## 2013-11-03 ENCOUNTER — Encounter: Payer: Self-pay | Admitting: Adult Health

## 2013-11-03 VITALS — BP 158/82 | HR 76 | Ht 63.0 in | Wt 257.6 lb

## 2013-11-03 DIAGNOSIS — Z853 Personal history of malignant neoplasm of breast: Secondary | ICD-10-CM

## 2013-11-03 DIAGNOSIS — Z01419 Encounter for gynecological examination (general) (routine) without abnormal findings: Secondary | ICD-10-CM

## 2013-11-03 DIAGNOSIS — Z1212 Encounter for screening for malignant neoplasm of rectum: Secondary | ICD-10-CM

## 2013-11-03 NOTE — Progress Notes (Signed)
Patient ID: Gina Solis, female   DOB: 23-Oct-1960, 53 y.o.   MRN: [id number] History of Present Illness: Gina Solis is a 53 year old black female, in for a physical.She saw GI yesterday and is to continue dexilant and try amitiza for constipation.She had a stent placed in March and still has some shortness of breath with activity at times.But she feels better, no chest pains now.She has delayed her breast surgery to put implant in.   Current Medications, Allergies, Past Medical History, Past Surgical History, Family History and Social History were reviewed in [employer] record.   Past Medical History  Diagnosis Date  . Breast cancer 2008    a. diagnosed at stage IIIB  . Dermatomyositis   . Essential hypertension, benign   . Hypercholesteremia   . Arthritis   . GERD (gastroesophageal reflux disease)   . Lymphedema of arm     a. in left arm  . Multiple thyroid nodules   . 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 CAD (coronary artery disease)     a. s/p DES to LAD (09/29/13)  . History of echocardiogram     a. mod concentric and severe basal septal hypertrophy. EF 60-65%, no WMA, g1dd. Mild MR/TR   Past Surgical History  Procedure Laterality Date  . Abdominal hysterectomy    . Tubal ligation  1985  . Esophagogastroduodenoscopy  05/02/2007    FYB:OFBPZ hiatal hernia, otherwise normal stomach./Normal duodenal bulb and second portion of the duodenum/White plaques seen from the proximal to the distal esophagus/ Shallow ulcerations with mild erythema seen scattered throughout the esophagus most pronounced in the distal esophagus.  HSV1  . Colonoscopy with esophagogastroduodenoscopy (egd)  07/15/2012    SLF:Two sessile polyps measuring 3-4 mm in size were found in the ascending colon and rectum; polypectomy was performed with cold forceps/ The colon mucosa was otherwise normal/Moderate sized internal hemorrhoids  . Breast reconstruction  left    X 2,  trying again in spring.   . Mastectomy, partial  2008    Left-lumpectomy-axillary nodes x14  . Coronary angioplasty with stent placement  09/29/2013  Current outpatient prescriptions:acetaminophen (TYLENOL) 500 MG tablet, Take 1,000 mg by mouth every 6 (six) hours as needed for mild pain. , Disp: , Rfl: ;  aspirin 81 MG tablet, Take 1 tablet (81 mg total) by mouth daily., Disp: , Rfl: ;  atorvastatin (LIPITOR) 80 MG tablet, Take 1 tablet (80 mg total) by mouth daily at 6 PM., Disp: 30 tablet, Rfl: 6 dexlansoprazole (DEXILANT) 60 MG capsule, Take 1 capsule (60 mg total) by mouth daily., Disp: 30 capsule, Rfl: 6;  diclofenac (VOLTAREN) 75 MG EC tablet, Take 75 mg by mouth 2 (two) times daily as needed for mild pain., Disp: , Rfl: ;  hydrochlorothiazide (HYDRODIURIL) 25 MG tablet, Take 1 tablet (25 mg total) by mouth daily., Disp: 30 tablet, Rfl: 3 lubiprostone (AMITIZA) 24 MCG capsule, Take 1 capsule (24 mcg total) by mouth 2 (two) times daily as needed for constipation., Disp: 60 capsule, Rfl: 11;  metoprolol tartrate (LOPRESSOR) 25 MG tablet, Take 1 tablet (25 mg total) by mouth 2 (two) times daily., Disp: 60 tablet, Rfl: 6;  Ticagrelor (BRILINTA) 90 MG TABS tablet, Take 1 tablet (90 mg total) by mouth 2 (two) times daily., Disp: 60 tablet, Rfl: 11 traMADol (ULTRAM) 50 MG tablet, Take 50 mg by mouth every 6 (six) hours as needed for moderate pain. , Disp: , Rfl: ;  oxyCODONE-acetaminophen (PERCOCET/ROXICET) 5-325 MG per tablet, Take 1 tablet by mouth every 4 (four) hours as needed for severe pain., Disp: 10 tablet, Rfl: 0  Review of Systems: Patient denies any headaches, blurred vision, chest pain, abdominal pain, problems with urination, or intercourse. No joint swellling or mood swings, see HPI for positives.    Physical Exam:BP 158/82  Pulse 76  Ht 5\' 3"  (1.6 m)  Wt 257 lb 9.6 oz (116.847 kg)  BMI 45.64 kg/m2 General:  Well developed, well nourished, no acute distress Skin:  Warm and dry Neck:   Midline trachea, enlarged thyroid Lungs; Clear to auscultation bilaterally Breast:  No dominant palpable mass, retraction, or nipple discharge, sp left breast surgery with significantly smaller breast Cardiovascular: Regular rate and rhythm Abdomen:  Soft, non tender, no hepatosplenomegaly Pelvic:  External genitalia is normal in appearance.  The vagina is normal in appearance.The cervix and uterus are absent. No adnexal masses or tenderness noted. Rectal: Good sphincter tone, no polyps, or hemorrhoids felt.  Hemoccult negative. Extremities:  No swelling or varicosities noted Psych:  No mood changes, alert and cooperative,seems happy   Impression: Yearly gyn exam, no pap    Plan: Physical in 1 year Mammogram yearly Colonoscopy 2024 Call cardiologist and ask about shortness of breath with activity

## 2013-11-03 NOTE — Patient Instructions (Signed)
Physical in 1 year Mammogram yearly Colonoscopy 2024 Call cardiologist

## 2013-11-07 ENCOUNTER — Encounter: Payer: Self-pay | Admitting: Family Medicine

## 2013-11-07 ENCOUNTER — Ambulatory Visit (INDEPENDENT_AMBULATORY_CARE_PROVIDER_SITE_OTHER): Payer: Medicare Other | Admitting: Family Medicine

## 2013-11-07 VITALS — BP 136/82 | HR 88 | Temp 98.2°F | Resp 14 | Ht 63.0 in | Wt 257.0 lb

## 2013-11-07 DIAGNOSIS — I2510 Atherosclerotic heart disease of native coronary artery without angina pectoris: Secondary | ICD-10-CM

## 2013-11-07 DIAGNOSIS — R609 Edema, unspecified: Secondary | ICD-10-CM

## 2013-11-07 DIAGNOSIS — R600 Localized edema: Secondary | ICD-10-CM

## 2013-11-07 DIAGNOSIS — I10 Essential (primary) hypertension: Secondary | ICD-10-CM

## 2013-11-07 MED ORDER — FLUTICASONE PROPIONATE 50 MCG/ACT NA SUSP: 2.0000 | Freq: Every day | NASAL | Status: DC

## 2013-11-07 MED ORDER — TRAMADOL HCL 50 MG PO TABS: 50.0000 mg | ORAL_TABLET | Freq: Four times a day (QID) | ORAL | Status: DC | PRN

## 2013-11-07 MED ORDER — FUROSEMIDE 20 MG PO TABS: 20.0000 mg | ORAL_TABLET | Freq: Every day | ORAL | Status: DC

## 2013-11-07 NOTE — Patient Instructions (Signed)
Stop the HCTZ  Start lasix 20mg  in the morning  Ultram for ankle pain  Continue all other medications F/U 3 months

## 2013-11-08 ENCOUNTER — Telehealth: Payer: Self-pay | Admitting: Adult Health

## 2013-11-08 MED ORDER — CEPHALEXIN 500 MG PO CAPS: 500.0000 mg | ORAL_CAPSULE | Freq: Four times a day (QID) | ORAL | Status: DC

## 2013-11-08 NOTE — Telephone Encounter (Signed)
Complains of sinus congestion ?infection will rx keflex, if not better call to be seen

## 2013-11-08 NOTE — Assessment & Plan Note (Signed)
Will start cardiac rehab, which will also help with weight

## 2013-11-08 NOTE — Telephone Encounter (Signed)
Pt states that she has bad sinuses and wants you to call her in something, she is having bad sinus pressure and headache. PT states that it makes her sick to her stomach.

## 2013-11-08 NOTE — Assessment & Plan Note (Signed)
BP looks okay, no change to meds 

## 2013-11-08 NOTE — Assessment & Plan Note (Signed)
Trial of lasix

## 2013-11-08 NOTE — Assessment & Plan Note (Signed)
Reviewed cardiology note on lipitor, BB

## 2013-11-08 NOTE — Progress Notes (Signed)
Patient ID: Gina Solis, female   DOB: 05/31/1961, 53 y.o.   MRN: [id number]   Subjective:    Patient ID: Gina Solis, female    DOB: 07/08/1961, 53 y.o.   MRN: [id number]  Patient presents for F/U heart stint (09/28/2013)  Pt here to f/u chronic medical problems and medications. Has leg swelling cause more pain when she ambulates, taking HCTZ on regular basis with minimal improvement. Seen by cardiology had stent placed for CAD on Statin , Brillinta Continues to have ankle and joint pain request refill on ultram     Review Of Systems:  GEN- denies fatigue, fever, weight loss,weakness, recent illness HEENT- denies eye drainage, change in vision, nasal discharge, CVS- denies chest pain, palpitations RESP- denies SOB, cough, wheeze MSK- + joint pain, muscle aches, injury Neuro- denies headache, dizziness, syncope, seizure activity       Objective:    BP 136/82  Pulse 88  Temp(Src) 98.2 F (36.8 C) (Oral)  Resp 14  Ht 5\' 3"  (1.6 m)  Wt 257 lb (116.574 kg)  BMI 45.54 kg/m2 GEN- NAD, alert and oriented x3 HEENT- PERRL, EOMI, non injected sclera, pink conjunctiva, MMM, oropharynx clear Neck- Supple, no JVD CVS- RRR, no murmur RESP-CTAB ABD-NABS,soft,NT,ND EXT- pedal edema Pulses- Radial, DP- 2+        Assessment & Plan:      Problem List Items Addressed This Visit   None      Note: This dictation was prepared with Dragon dictation along with smaller phrase technology. Any transcriptional errors that result from this process are unintentional.

## 2013-12-12 ENCOUNTER — Encounter (HOSPITAL_COMMUNITY): Payer: Medicare Other | Attending: Hematology and Oncology

## 2013-12-12 ENCOUNTER — Encounter (HOSPITAL_COMMUNITY): Payer: Self-pay

## 2013-12-12 ENCOUNTER — Other Ambulatory Visit (HOSPITAL_COMMUNITY): Payer: Self-pay | Admitting: Oncology

## 2013-12-12 VITALS — BP 137/61 | HR 103 | Temp 97.8°F | Resp 20 | Wt 253.9 lb

## 2013-12-12 DIAGNOSIS — R928 Other abnormal and inconclusive findings on diagnostic imaging of breast: Secondary | ICD-10-CM

## 2013-12-12 DIAGNOSIS — I890 Lymphedema, not elsewhere classified: Secondary | ICD-10-CM | POA: Insufficient documentation

## 2013-12-12 DIAGNOSIS — E041 Nontoxic single thyroid nodule: Secondary | ICD-10-CM

## 2013-12-12 DIAGNOSIS — K219 Gastro-esophageal reflux disease without esophagitis: Secondary | ICD-10-CM

## 2013-12-12 DIAGNOSIS — M3390 Dermatopolymyositis, unspecified, organ involvement unspecified: Secondary | ICD-10-CM

## 2013-12-12 DIAGNOSIS — Z853 Personal history of malignant neoplasm of breast: Secondary | ICD-10-CM

## 2013-12-12 DIAGNOSIS — I2510 Atherosclerotic heart disease of native coronary artery without angina pectoris: Secondary | ICD-10-CM

## 2013-12-12 NOTE — Progress Notes (Signed)
[facility]  OFFICE PROGRESS NOTE  [place], Lonell Grandchild, MD [address] [place] [address]  DIAGNOSIS: BREAST CANCER, HX OF - Plan: CBC with Differential, Comprehensive metabolic panel, CEA, Cancer antigen 27.29, PR COMPLEX LYMPHEDEMA THERAPY,  CAD (coronary artery disease)  GERD (gastroesophageal reflux disease)  THYROID NODULE  Dermatomyositis  Morbid obesity  LYMPHEDEMA, LEFT ARM - Plan: PR COMPLEX LYMPHEDEMA THERAPY,  Chief Complaint  Patient presents with  . Breast Cancer    CURRENT THERAPY: Status post preoperative dose dense FEC chemotherapy for 6 cycles followed by left modified radical mastectomy  at which time 2 of 15 lymph nodes were found to be positive followed by radiotherapy for triple-negative disease.  INTERVAL HISTORY: Gina Solis 53 y.o. female returns for followup of triple negative breast cancer having not been seen in this clinic since 05/14/2011. She had developed chest pain in March of 2015 and was evaluated with echocardiogram and stress test. Cardiac catheterization revealed 95% stenosis with stent inserted on 09/28/2013. Since the stent has been inserted, the patient denies any chest pain exertion. She denies any PND, orthopnea, or palpitations. She does suffer with left upper extremity lymphedema which was never addressed. She denies any abnormalities on self breast examination and is currently undergoing attempt at reconstruction of the left breast which have been unsuccessful. Appetite has been good with regular bowel movements and no melena, hematochezia, hematuria, vaginal bleeding or discharge, cough, wheezing, dysuria, hematuria, incontinence, skin rash, headache, or seizures.  MEDICAL HISTORY: Past Medical History  Diagnosis Date  . Breast cancer 2008    a. diagnosed at stage IIIB  . Dermatomyositis   . Essential hypertension, benign   . Hypercholesteremia   . Arthritis   . GERD  (gastroesophageal reflux disease)   . Lymphedema of arm     a. in left arm  . Multiple thyroid nodules   . 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 CAD (coronary artery disease)     a. s/p DES to LAD (09/29/13)  . History of echocardiogram     a. mod concentric and severe basal septal hypertrophy. EF 60-65%, no WMA, g1dd. Mild MR/TR    INTERIM HISTORY: has THYROID NODULE; HYPERLIPIDEMIA; Morbid obesity; HYPERTENSION; LYMPHEDEMA, LEFT ARM; GERD; BREAST CANCER, HX OF; Hemorrhoids; Laryngopharyngeal reflux (LPR); GERD (gastroesophageal reflux disease); Alopecia; Dermatomyositis; Laryngitis; SOB (shortness of breath) on exertion; Prolonged QT interval; Leg edema; Abnormal myocardial perfusion study; Other and unspecified angina pectoris; CAD (coronary artery disease); History of echocardiogram; and Constipation on her problem list.   Stage IIIB (T4b N2) breast cancer, triple negative, high grade, infiltrating ductal type. 1.2 cm in size at time of resection with 2/15 nodes positive for disease. LVI identified  ALLERGIES:  is allergic to codeine.  MEDICATIONS: has a current medication list which includes the following prescription(s): acetaminophen, aspirin, dexlansoprazole, diclofenac, fluticasone, furosemide, lubiprostone, metoprolol tartrate, nitroglycerin, oxycodone-acetaminophen, ticagrelor, tramadol, atorvastatin, and cephalexin.  SURGICAL HISTORY:  Past Surgical History  Procedure Laterality Date  . Abdominal hysterectomy    . Tubal ligation  1985  . Esophagogastroduodenoscopy  05/02/2007    SL[facility]:TDSKA hiatal hernia, otherwise normal stomach./Normal duodenal bulb and second portion of the duodenum/White plaques seen from the proximal to the distal esophagus/ Shallow ulcerations with mild erythema seen scattered throughout the esophagus most pronounced in the distal esophagus.  HSV1  . Colonoscopy with esophagogastroduodenoscopy (egd)  07/15/2012    SLF:Two sessile  polyps  measuring 3-4 mm in size were found in the ascending colon and rectum; polypectomy was performed with cold forceps/ The colon mucosa was otherwise normal/Moderate sized internal hemorrhoids  . Breast reconstruction  left    X 2, trying again in spring.   . Mastectomy, partial  2008    Left-lumpectomy-axillary nodes x14  . Coronary angioplasty with stent placement  09/29/2013    FAMILY HISTORY: family history includes Arthritis in her sister; Breast cancer in her paternal aunt; Cancer in her maternal grandmother; Colon cancer in her maternal uncle and paternal grandfather; Dementia in her paternal uncle; Diabetes in her brother and maternal grandmother; Heart attack in her paternal uncle; Heart disease in her maternal grandfather; Heart failure in her mother; Heart murmur in her sister; Hyperlipidemia in her brother and father; Hypertension in her brother and sister; Kidney failure in her mother; Sickle cell trait in her daughter.  SOCIAL HISTORY:  reports that she quit smoking about 11 years ago. Her smoking use included Cigarettes. She has a 1 pack-year smoking history. She has never used smokeless tobacco. She reports that she does not drink alcohol or use illicit drugs.  REVIEW OF SYSTEMS:  Other than that discussed above is noncontributory.  PHYSICAL EXAMINATION: ECOG PERFORMANCE STATUS: 1 - Symptomatic but completely ambulatory  Blood pressure 137/61, pulse 103, temperature 97.8 F (36.6 C), resp. rate 20, weight 253 lb 14.4 oz (115.168 kg).  GENERAL:alert, no distress and comfortable. Morbidly obese. SKIN: skin color, texture, turgor are normal, no rashes or significant lesions EYES: PERLA; Conjunctiva are pink and non-injected, sclera clear SINUSES: No redness or tenderness over maxillary or ethmoid sinuses OROPHARYNX:no exudate, no erythema on lips, buccal mucosa, or tongue. NECK: supple, thyroid normal size, non-tender, without nodularity. No masses CHEST: Status post left breast  mastectomy with radiotherapy going ectatic changes as well as fibrosis. Attempted breast reconstruction is present with significant distortion and dwarding of the left breast versus the right. LYMPH:  no palpable lymphadenopathy in the cervical, axillary or inguinal LUNGS: clear to auscultation and percussion with normal breathing effort HEART: regular rate & rhythm and no murmurs. ABDOMEN:abdomen soft, non-tender and normal bowel sounds MUSCULOSKELETAL:no cyanosis of digits and no clubbing. Range of motion normal.. +4 left upper extremity lymphedema.  NEURO: alert & oriented x 3 with fluent speech, no focal motor/sensory deficits   LABORATORY DATA: No visits with results within 30 Day(s) from this visit. Latest known visit with results is:  Admission on 09/28/2013, Discharged on 09/29/2013  Component Date Value Ref Range Status  . Sodium 09/28/2013 141  137 - 147 mEq/L Final  . Potassium 09/28/2013 3.7  3.7 - 5.3 mEq/L Final  . Chloride 09/28/2013 102  96 - 112 mEq/L Final  . CO2 09/28/2013 24  19 - 32 mEq/L Final  . Glucose, Bld 09/28/2013 89  70 - 99 mg/dL Final  . BUN 09/28/2013 16  6 - 23 mg/dL Final  . Creatinine, Ser 09/28/2013 0.97  0.50 - 1.10 mg/dL Final  . Calcium 09/28/2013 9.6  8.4 - 10.5 mg/dL Final  . GFR calc non Af Amer 09/28/2013 66* >90 mL/min Final  . GFR calc Af Amer 09/28/2013 76* >90 mL/min Final   Comment: (NOTE)                          The eGFR has been calculated using the CKD EPI equation.  This calculation has not been validated in all clinical situations.                          eGFR's persistently <90 mL/min signify possible Chronic Kidney                          Disease.  . WBC 09/28/2013 6.8  4.0 - 10.5 K/uL Final  . RBC 09/28/2013 4.55  3.87 - 5.11 MIL/uL Final  . Hemoglobin 09/28/2013 12.8  12.0 - 15.0 g/dL Final  . HCT 09/28/2013 38.9  36.0 - 46.0 % Final  . MCV 09/28/2013 85.5  78.0 - 100.0 fL Final  . MCH 09/28/2013  28.1  26.0 - 34.0 pg Final  . MCHC 09/28/2013 32.9  30.0 - 36.0 g/dL Final  . RDW 09/28/2013 14.3  11.5 - 15.5 % Final  . Platelets 09/28/2013 273  150 - 400 K/uL Final  . Prothrombin Time 09/28/2013 12.8  11.6 - 15.2 seconds Final  . INR 09/28/2013 0.98  0.00 - 1.49 Final  . WBC 09/29/2013 7.3  4.0 - 10.5 K/uL Final  . RBC 09/29/2013 4.54  3.87 - 5.11 MIL/uL Final  . Hemoglobin 09/29/2013 12.9  12.0 - 15.0 g/dL Final  . HCT 09/29/2013 38.5  36.0 - 46.0 % Final  . MCV 09/29/2013 84.8  78.0 - 100.0 fL Final  . MCH 09/29/2013 28.4  26.0 - 34.0 pg Final  . MCHC 09/29/2013 33.5  30.0 - 36.0 g/dL Final  . RDW 09/29/2013 14.3  11.5 - 15.5 % Final  . Platelets 09/29/2013 251  150 - 400 K/uL Final  . Sodium 09/29/2013 141  137 - 147 mEq/L Final  . Potassium 09/29/2013 3.7  3.7 - 5.3 mEq/L Final  . Chloride 09/29/2013 102  96 - 112 mEq/L Final  . CO2 09/29/2013 26  19 - 32 mEq/L Final  . Glucose, Bld 09/29/2013 100* 70 - 99 mg/dL Final  . BUN 09/29/2013 10  6 - 23 mg/dL Final  . Creatinine, Ser 09/29/2013 0.97  0.50 - 1.10 mg/dL Final  . Calcium 09/29/2013 9.3  8.4 - 10.5 mg/dL Final  . GFR calc non Af Amer 09/29/2013 66* >90 mL/min Final  . GFR calc Af Amer 09/29/2013 76* >90 mL/min Final   Comment: (NOTE)                          The eGFR has been calculated using the CKD EPI equation.                          This calculation has not been validated in all clinical situations.                          eGFR's persistently <90 mL/min signify possible Chronic Kidney                          Disease.  Marland Kitchen Activated Clotting Time 09/28/2013 642   Final    PATHOLOGY: Triple negative  Urinalysis    Component Value Date/Time   COLORURINE YELLOW 05/01/2012 2100   APPEARANCEUR CLOUDY* 05/01/2012 2100   LABSPEC 1.015 05/01/2012 2100   PHURINE 7.5 05/01/2012 2100   GLUCOSEU NEGATIVE 05/01/2012 2100   HGBUR SMALL* 05/01/2012 2100   HGBUR small 04/12/2009  [place] 05/01/2012 2100    [place] 05/01/2012 2100   PROTEINUR NEGATIVE 05/01/2012 2100   UROBILINOGEN 0.2 05/01/2012 2100   NITRITE NEGATIVE 05/01/2012 2100   LEUKOCYTESUR NEGATIVE 05/01/2012 2100    RADIOGRAPHIC STUDIES:      2D Echocardiogram without contrast     Ordering Physician: Satira Sark, MD  Order# [id number] Study Date: 09/22/13      Patient Information     Name MRN  Description    BILLI BRIGHT [id number]  53 year old Female          Result Notes    Notes Recorded by Bernita Raisin, RN on 09/25/2013 at 8:48 AM Pt notified,copy to pcp ------  Notes Recorded by Satira Sark, MD on 09/22/2013 at 2:43 PM Reviewed. LVEF is normal in the range of 60-65%, no wall motion abnormalities. Mild diastolic dysfunction noted. This study would not necessarily be a an explanation for her shortness of breath. Will await stress testing results.              Result Narrative    *[place], [place] [address] [phone]  ------------------------------------------------------------ Transthoracic Echocardiography  Patient: [place], [place] MR #: [id number] Study Date: 09/22/2013 Gender: F Age: 72 Height: 160cm Weight: 115.2kg BSA: 2.10m2 Pt. Status: Room:  ATTENDING [place], [place] KBartoloSONOGRAPHER CLina Sar RDCS PERFORMING [facility], AForestine Nacc:  ------------------------------------------------------------ LV EF: 60% - 65%  ------------------------------------------------------------ Indications: Precordial pain 786.51.  ------------------------------------------------------------ History: PMH: Dyspnea. Risk factors: History of breast cancer s/p chemo, GERD Former tobacco use. Hypertension. Morbidly obese. Dyslipidemia.  ------------------------------------------------------------ Study Conclusions  - Left ventricle: Moderate concentric and  severe basal septal hypertrophy. The cavity size was normal. Systolic function was normal. The estimated ejection fraction was in the range of 60% to 65%. Wall motion was normal; there were no regional wall motion abnormalities. There was an increased relative contribution of atrial contraction to ventricular filling. Doppler parameters are consistent with abnormal left ventricular relaxation (grade 1 diastolic dysfunction). Doppler parameters are consistent with high ventricular filling pressure. - Mitral valve: Mild regurgitation. - Tricuspid valve: Mild regurgitation. Transthoracic echocardiography. M-mode, complete 2D, spectral Doppler, and color Doppler. Height: Height: 160cm. Height: 63in. Weight: Weight: 115.2kg. Weight: 253.5lb. Body mass index: BMI: 45kg/m^2. Body surface area: BSA: 2.169m. Blood pressure: 158/89. Patient status: Outpatient. Location: Echo laboratory.  ------------------------------------------------------------  ------------------------------------------------------------ Left ventricle: Moderate concentric and severe basal septal hypertrophy. The cavity size was normal. Systolic function was normal. The estimated ejection fraction was in the range of 60% to 65%. Wall motion was normal; there were no regional wall motion abnormalities. There was an increased relative contribution of atrial contraction to ventricular filling. Doppler parameters are consistent with abnormal left ventricular relaxation (grade 1 diastolic dysfunction). Doppler parameters are consistent with high ventricular filling pressure.  ------------------------------------------------------------ Aortic valve: Structurally normal valve. Cusp separation was normal. Doppler: Transvalvular velocity was within the normal range. There was no stenosis. No regurgitation.  ------------------------------------------------------------ Aorta: Aortic root: The aortic root was normal in  size.  ------------------------------------------------------------ Mitral valve: Structurally normal valve. Leaflet separation was normal. Doppler: Transvalvular velocity was within the normal range. There was no evidence for stenosis. Mild regurgitation. Peak gradient: 35m47mg (D).  ------------------------------------------------------------ Left atrium: The atrium was normal in size.  ------------------------------------------------------------ Atrial septum: No defect or patent foramen ovale was identified.  ------------------------------------------------------------ Right ventricle: The cavity size was normal. Wall  thickness was normal. Systolic function was normal.  ------------------------------------------------------------ Pulmonic valve: The valve appears to be grossly normal. Doppler: No significant regurgitation.  ------------------------------------------------------------ Tricuspid valve: Structurally normal valve. Leaflet separation was normal. Doppler: Transvalvular velocity was within the normal range. Mild regurgitation.  ------------------------------------------------------------ Right atrium: The atrium was normal in size.  ------------------------------------------------------------ Pericardium: There was no pericardial effusion.  ------------------------------------------------------------ Systemic veins: Inferior vena cava: The vessel was normal in size; the respirophasic diameter changes were in the normal range (= 50%); findings are consistent with normal central venous pressure.  ------------------------------------------------------------  2D measurements Normal Doppler Normal Left ventricle measurements LVID ED, 34.8 mm 43-52 Main pulmonary chord, artery PLAX Pressure, S 32 mm =30 LVID ES, 26.5 mm 23-38 Hg chord, Left ventricle PLAX Ea, lat 8.5 cm/ ------- FS, chord, 24 % >29 ann, tiss s PLAX DP LVPW, ED 14.9 mm ------ E/Ea, lat 11.27  ------- IVS/LVPW 1.08 <1.3 ann, tiss ratio, ED DP Vol ED, 59 ml ------ Ea, med 6.6 cm/ ------- MOD1 ann, tiss s Vol ES, 25 ml ------ DP MOD1 E/Ea, med 14.52 ------- EF, MOD1 58 % ------ ann, tiss Vol index, 28 ml/m^2 ------ DP ED, MOD1 Mitral valve Vol index, 12 ml/m^2 ------ Peak E vel 95.8 cm/ ------- ES, MOD1 s Vol ED, 72 ml ------ Peak A vel 105 cm/ ------- MOD2 s Vol ES, 33 ml ------ Deceleratio 232 ms 150-230 MOD2 n time EF, MOD2 54 % ------ Peak 4 mm ------- Stroke 39 ml ------ gradient, D Hg vol, MOD2 Peak E/A 0.9 ------- Vol index, 34 ml/m^2 ------ ratio ED, MOD2 Tricuspid valve Vol index, 15 ml/m^2 ------ Regurg peak 270 cm/ ------- ES, MOD2 vel s Stroke 18.2 ml/m^2 ------ Peak RV-RA 29 mm ------- index, gradient, S Hg MOD2 Max regurg 270 cm/ ------- Ventricular septum vel s IVS, ED 16.1 mm ------ Systemic veins Aorta Estimated 3 mm ------- Root diam, 33 mm ------ CVP Hg ED Right ventricle Left atrium Pressure, S 32 mm <30 AP dim 33 mm ------ Hg AP dim 1.54 cm/m^2 <2.2 Sa vel, lat 13.5 cm/ ------- index ann, tiss s DP  ------------------------------------------------------------ Prepared and Electronically Authenticated by  Kate Sable, MD 2015-02-27T14:[id number]         MM Digital Diagnostic Bilat Status: Final result            Study Result    *RADIOLOGY REPORT*  Clinical Data: History of left breast cancer status post  lumpectomy 2008.  DIGITAL DIAGNOSTIC BILATERAL MAMMOGRAM WITH CAD  Comparison: With priors  Findings:  ACR Breast Density Category c: The breast tissue is heterogeneously  dense.  There are stable lumpectomy changes in the left breast. There is  no suspicious mass, malignant-type microcalcifications or  distortion.  Mammographic images were processed with CAD.  IMPRESSION:  No evidence of malignancy in either breast.  RECOMMENDATION:  Bilateral diagnostic mammogram in 1 year is recommended.  I have discussed  the findings and recommendations with the patient.  Results were also provided in writing at the conclusion of the  visit. If applicable, a reminder letter will be sent to the  patient regarding her next appointment.  BI-RADS CATEGORY 2: Benign finding(s).  Original Report Authenticated      ASSESSMENT:  #1. Stage III-B triple negative infiltrating ductal carcinoma left breast, status post preoperative FEC x6 cycles followed by left modified radical mastectomy followed by radiotherapy, no evidence of disease pending today's lab tests. #2. Left upper extremity lymphedema. #3. Dermatomyositis. #4. Morbid obesity.  PLAN:  #1. Referral to physical therapy for lymphedema treatment. #2. She was told to continue her current medications. #3. Followup in 6 months pending today's lab reports. #4. No further plastic surgery until at least October 2015. #5. Followup in 6 months with CBC, chem profile, and CA 27-29, and CEA.      All questions were answered. The patient knows to call the clinic with any problems, questions or concerns. We can certainly see the patient much sooner if necessary.   I spent 40 minutes counseling the patient face to face. The total time spent in the appointment was 55 minutes.    Farrel Gobble, MD 12/13/2013 6:41 AM  DISCLAIMER:  This note was dictated with voice recognition software.  Similar sounding words can inadvertently be transcribed inaccurately and may not be corrected upon review.

## 2013-12-12 NOTE — Patient Instructions (Signed)
[facility] Discharge Instructions  RECOMMENDATIONS MADE BY THE CONSULTANT AND ANY TEST RESULTS WILL BE SENT TO YOUR REFERRING PHYSICIAN.  EXAM FINDINGS BY THE PHYSICIAN TODAY AND SIGNS OR SYMPTOMS TO REPORT TO CLINIC OR PRIMARY PHYSICIAN: Exam and findings as discussed by Dr. Barnet Glasgow. Report any new lumps, bone pain, shortness of breath or other symptoms.  If there are any issues with your blood work we will contact your.  Will make a referral to PT for lymphedema consult and treatment.  You will be called with date and time of appointment.  MEDICATIONS PRESCRIBED:  none  INSTRUCTIONS/FOLLOW-UP: Follow-up in 6 months.  Thank you for choosing [facility] to provide your oncology and hematology care.  To afford each patient quality time with our providers, please arrive at least 15 minutes before your scheduled appointment time.  With your help, our goal is to use those 15 minutes to complete the necessary work-up to ensure our physicians have the information they need to help with your evaluation and healthcare recommendations.    Effective January 1st, 2014, we ask that you re-schedule your appointment with our physicians should you arrive 10 or more minutes late for your appointment.  We strive to give you quality time with our providers, and arriving late affects you and other patients whose appointments are after yours.    Again, thank you for choosing [facility].  Our hope is that these requests will decrease the amount of time that you wait before being seen by our physicians.       _____________________________________________________________  Should you have questions after your visit to [facility], please contact our office at [phone] between the hours of 8:30 a.m. and 5:00 p.m.  Voicemails left after 4:30 p.m. will not be returned until the following business day.  For prescription refill requests, have your  pharmacy contact our office with your prescription refill request.

## 2013-12-13 ENCOUNTER — Other Ambulatory Visit (HOSPITAL_COMMUNITY): Payer: Self-pay

## 2013-12-13 ENCOUNTER — Encounter (HOSPITAL_BASED_OUTPATIENT_CLINIC_OR_DEPARTMENT_OTHER): Payer: Medicare Other

## 2013-12-13 DIAGNOSIS — Z853 Personal history of malignant neoplasm of breast: Secondary | ICD-10-CM

## 2013-12-13 LAB — CBC WITH DIFFERENTIAL/PLATELET
Basophils Absolute: 0 10*3/uL (ref 0.0–0.1)
Basophils Relative: 0 % (ref 0–1)
Eosinophils Absolute: 0.2 10*3/uL (ref 0.0–0.7)
Eosinophils Relative: 2 % (ref 0–5)
HCT: 35.5 % — ABNORMAL LOW (ref 36.0–46.0)
Hemoglobin: 11.4 g/dL — ABNORMAL LOW (ref 12.0–15.0)
LYMPHS ABS: 2.6 10*3/uL (ref 0.7–4.0)
LYMPHS PCT: 38 % (ref 12–46)
MCH: 27.5 pg (ref 26.0–34.0)
MCHC: 32.1 g/dL (ref 30.0–36.0)
MCV: 85.7 fL (ref 78.0–100.0)
Monocytes Absolute: 0.3 10*3/uL (ref 0.1–1.0)
Monocytes Relative: 4 % (ref 3–12)
NEUTROS PCT: 56 % (ref 43–77)
Neutro Abs: 3.8 10*3/uL (ref 1.7–7.7)
Platelets: 280 10*3/uL (ref 150–400)
RBC: 4.14 MIL/uL (ref 3.87–5.11)
RDW: 14.3 % (ref 11.5–15.5)
WBC: 6.9 10*3/uL (ref 4.0–10.5)

## 2013-12-13 LAB — COMPREHENSIVE METABOLIC PANEL
ALK PHOS: 99 U/L (ref 39–117)
ALT: 14 U/L (ref 0–35)
AST: 14 U/L (ref 0–37)
Albumin: 3.5 g/dL (ref 3.5–5.2)
BILIRUBIN TOTAL: 0.4 mg/dL (ref 0.3–1.2)
BUN: 17 mg/dL (ref 6–23)
CO2: 25 meq/L (ref 19–32)
Calcium: 9 mg/dL (ref 8.4–10.5)
Chloride: 102 mEq/L (ref 96–112)
Creatinine, Ser: 0.98 mg/dL (ref 0.50–1.10)
GFR calc Af Amer: 76 mL/min — ABNORMAL LOW (ref >90)
GFR, EST NON AFRICAN AMERICAN: 65 mL/min — AB (ref >90)
GLUCOSE: 102 mg/dL — AB (ref 70–99)
POTASSIUM: 3.6 meq/L — AB (ref 3.7–5.3)
Sodium: 142 mEq/L (ref 137–147)
TOTAL PROTEIN: 7.5 g/dL (ref 6.0–8.3)

## 2013-12-14 ENCOUNTER — Telehealth (HOSPITAL_COMMUNITY): Payer: Self-pay | Admitting: Hematology and Oncology

## 2013-12-14 ENCOUNTER — Other Ambulatory Visit (HOSPITAL_COMMUNITY): Payer: Self-pay | Admitting: Hematology and Oncology

## 2013-12-14 DIAGNOSIS — I890 Lymphedema, not elsewhere classified: Principal | ICD-10-CM

## 2013-12-14 DIAGNOSIS — E8989 Other postprocedural endocrine and metabolic complications and disorders: Secondary | ICD-10-CM

## 2013-12-14 LAB — CANCER ANTIGEN 27.29: CA 27.29: 14 U/mL (ref 0–39)

## 2013-12-14 LAB — CEA: CEA: <0.5 ng/mL (ref 0.0–5.0)

## 2013-12-14 NOTE — Telephone Encounter (Signed)
Per Anderson Malta in PT she will call pt to make an appt for lypm consult

## 2013-12-22 NOTE — Progress Notes (Signed)
Labs drawn

## 2014-01-02 ENCOUNTER — Ambulatory Visit (HOSPITAL_COMMUNITY)
Admission: RE | Admit: 2014-01-02 | Discharge: 2014-01-02 | Disposition: A | Discharge status: Home / Self Care | Payer: Medicare Other | Source: Ambulatory Visit | Attending: Hematology and Oncology | Admitting: Hematology and Oncology

## 2014-01-02 DIAGNOSIS — IMO0001 Reserved for inherently not codable concepts without codable children: Secondary | ICD-10-CM | POA: Diagnosis present

## 2014-01-02 DIAGNOSIS — I972 Postmastectomy lymphedema syndrome: Secondary | ICD-10-CM | POA: Diagnosis not present

## 2014-01-02 DIAGNOSIS — Z853 Personal history of malignant neoplasm of breast: Secondary | ICD-10-CM | POA: Insufficient documentation

## 2014-01-02 NOTE — Evaluation (Addendum)
Physical Therapy Evaluation  Patient Details  Name: Gina Solis MRN: [id number] Date of Birth: 05-27-1961  Today's Date: 01/02/2014 Time: 0930-1015 PT Time Calculation (min): 45 min  Charge:  evaluation            Visit#: 1 of 1   Past Medical History:  Past Medical History  Diagnosis Date  . Breast cancer 2008    a. diagnosed at stage IIIB  . Dermatomyositis   . Essential hypertension, benign   . Hypercholesteremia   . Arthritis   . GERD (gastroesophageal reflux disease)   . Lymphedema of arm     a. in left arm  . Multiple thyroid nodules   . 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 CAD (coronary artery disease)     a. s/p DES to LAD (09/29/13)  . History of echocardiogram     a. mod concentric and severe basal septal hypertrophy. EF 60-65%, no WMA, g1dd. Mild MR/TR   Past Surgical History:  Past Surgical History  Procedure Laterality Date  . Abdominal hysterectomy    . Tubal ligation  1985  . Esophagogastroduodenoscopy  05/02/2007    OZD:GUYQI hiatal hernia, otherwise normal stomach./Normal duodenal bulb and second portion of the duodenum/White plaques seen from the proximal to the distal esophagus/ Shallow ulcerations with mild erythema seen scattered throughout the esophagus most pronounced in the distal esophagus.  HSV1  . Colonoscopy with esophagogastroduodenoscopy (egd)  07/15/2012    SLF:Two sessile polyps measuring 3-4 mm in size were found in the ascending colon and rectum; polypectomy was performed with cold forceps/ The colon mucosa was otherwise normal/Moderate sized internal hemorrhoids  . Breast reconstruction  left    X 2, trying again in spring.   . Mastectomy, partial  2008    Left-lumpectomy-axillary nodes x14  . Coronary angioplasty with stent placement  09/29/2013    Subjective Symptoms/Limitations Symptoms: Pt states that approximately two years after treatment for her breast cancer she noted increased swelling in her Lt hand.   She states that she has not had treatemtent for her lymphedema in the past.  Her edema varies and today it is pretty good but it varies in size.   Pertinent History: Pt diagnosed with Lt breast cancer in 2007 with 14 lymphnodes removed. Two were positive.  Pt underwent chemo as well as radiation.  On March 5th 2015 pt had a heart stent.   Patient Stated Goals: Arm to feel less heavy.    Balance Screening  no falls Date 01/02/2014 01/02/2014    right Left   MCP 20.70 21.20   wrist 18.8 19   4cm 24.00 23.40   8cm 26.20 26.20   12 cm 31.10 30.20   16cm 34.70 34.70   20cm 35.70 36.30   24cm 34.30 34.20   28cm 42.40 45.50   32cm 47.50 48.40   36cm 50.00 50.00   40cm 51.80 50.70   44cm 0.00 0.00   48cm 0.00 0.00   52cm 0.00 0.00   56cm 0.00 0.00   58cm 0.00 0.00   60cm 0.00 0.00        Sum of squares 15903.90 16131.20 0.00  Total Volume 5062.37 3474.2595 0                   Physical Therapy Assessment and Plan PT Assessment and Plan Clinical Impression Statement: Pt has a hx of breast cancer on the Lt side and has noticed that her Lt UE  seems swollen.  Evaluation demonstrates mild lymphedma which should be able to be controlled with a hand and arm garment.  Prescription faxed to MD PT Plan: one time treatment pt given information on self care and lymphedema will mail prescription for UE garment once returned by MD signed.     Goals    Problem List Patient Active Problem List   Diagnosis Date Noted  . Constipation 11/02/2013  . CAD (coronary artery disease)   . History of echocardiogram   . Other and unspecified angina pectoris 09/28/2013  . Abnormal myocardial perfusion study 09/27/2013  . SOB (shortness of breath) on exertion 04/24/2013  . Prolonged QT interval 04/24/2013  . Leg edema 04/24/2013  . Laryngitis 11/04/2012  . Dermatomyositis 09/20/2012  . Alopecia 07/26/2012  . GERD (gastroesophageal reflux disease) 06/21/2012  . Hemorrhoids 11/20/2011  .  Laryngopharyngeal reflux (LPR) 11/20/2011  . LYMPHEDEMA, LEFT ARM 09/04/2008  . THYROID NODULE 04/05/2007  . HYPERLIPIDEMIA 02/22/2007  . Morbid obesity 02/22/2007  . HYPERTENSION 02/22/2007  . GERD 02/22/2007  . BREAST CANCER, HX OF 02/22/2007       GP Functional Assessment Tool Used: clinical judgement Functional Limitation: Other PT primary Other PT Primary Current Status (P7106): At least 1 percent but less than 20 percent impaired, limited or restricted Other PT Primary Goal Status (Y6948): At least 1 percent but less than 20 percent impaired, limited or restricted Other PT Primary Discharge Status [phone]): At least 1 percent but less than 20 percent impaired, limited or restricted  Leeroy Cha 01/02/2014, 5:07 PM  Physician Documentation Your signature is required to indicate approval of the treatment plan as stated above.  Please sign and either send electronically or make a copy of this report for your files and return this physician signed original.   Please mark one 1.__approve of plan  2. ___approve of plan with the following conditions.   ______________________________                                                          _____________________ Physician Signature                                                                                                             Date

## 2014-01-16 ENCOUNTER — Encounter (HOSPITAL_COMMUNITY): Payer: Medicare Other

## 2014-01-17 ENCOUNTER — Ambulatory Visit: Payer: Medicare Other | Admitting: Cardiovascular Disease

## 2014-01-24 ENCOUNTER — Ambulatory Visit (HOSPITAL_COMMUNITY)
Admission: RE | Admit: 2014-01-24 | Discharge: 2014-01-24 | Disposition: A | Discharge status: Home / Self Care | Payer: Medicare Other | Source: Ambulatory Visit | Attending: Oncology | Admitting: Oncology

## 2014-01-24 ENCOUNTER — Other Ambulatory Visit (HOSPITAL_COMMUNITY): Payer: Self-pay | Admitting: Oncology

## 2014-01-24 DIAGNOSIS — Z853 Personal history of malignant neoplasm of breast: Secondary | ICD-10-CM | POA: Insufficient documentation

## 2014-01-24 DIAGNOSIS — R928 Other abnormal and inconclusive findings on diagnostic imaging of breast: Secondary | ICD-10-CM

## 2014-02-02 ENCOUNTER — Ambulatory Visit: Payer: Medicare Other | Admitting: Cardiology

## 2014-02-05 ENCOUNTER — Encounter: Payer: Self-pay | Admitting: Adult Health

## 2014-02-05 ENCOUNTER — Encounter: Payer: Medicare Other | Admitting: Adult Health

## 2014-02-05 NOTE — Progress Notes (Signed)
No show

## 2014-02-06 ENCOUNTER — Ambulatory Visit: Payer: Medicare Other | Admitting: Family Medicine

## 2014-02-22 ENCOUNTER — Encounter (HOSPITAL_COMMUNITY): Payer: Medicare Other

## 2014-03-26 ENCOUNTER — Encounter: Payer: Self-pay | Admitting: *Deleted

## 2014-03-31 ENCOUNTER — Other Ambulatory Visit: Payer: Self-pay | Admitting: Family Medicine

## 2014-03-31 NOTE — Telephone Encounter (Signed)
Refill appropriate and filled per protocol. 

## 2014-04-09 ENCOUNTER — Encounter: Payer: Self-pay | Admitting: Family Medicine

## 2014-04-09 ENCOUNTER — Ambulatory Visit (INDEPENDENT_AMBULATORY_CARE_PROVIDER_SITE_OTHER): Payer: Medicare Other | Admitting: Family Medicine

## 2014-04-09 VITALS — BP 138/74 | HR 68 | Temp 98.4°F | Resp 14 | Ht 63.0 in | Wt 254.0 lb

## 2014-04-09 DIAGNOSIS — M8949 Other hypertrophic osteoarthropathy, multiple sites: Secondary | ICD-10-CM

## 2014-04-09 DIAGNOSIS — Z23 Encounter for immunization: Secondary | ICD-10-CM

## 2014-04-09 DIAGNOSIS — E785 Hyperlipidemia, unspecified: Secondary | ICD-10-CM

## 2014-04-09 DIAGNOSIS — M1990 Unspecified osteoarthritis, unspecified site: Secondary | ICD-10-CM | POA: Insufficient documentation

## 2014-04-09 DIAGNOSIS — I10 Essential (primary) hypertension: Secondary | ICD-10-CM

## 2014-04-09 DIAGNOSIS — M150 Primary generalized (osteo)arthritis: Secondary | ICD-10-CM

## 2014-04-09 DIAGNOSIS — J019 Acute sinusitis, unspecified: Secondary | ICD-10-CM | POA: Insufficient documentation

## 2014-04-09 DIAGNOSIS — J0100 Acute maxillary sinusitis, unspecified: Secondary | ICD-10-CM

## 2014-04-09 DIAGNOSIS — M159 Polyosteoarthritis, unspecified: Secondary | ICD-10-CM

## 2014-04-09 DIAGNOSIS — I2510 Atherosclerotic heart disease of native coronary artery without angina pectoris: Secondary | ICD-10-CM

## 2014-04-09 DIAGNOSIS — R0602 Shortness of breath: Secondary | ICD-10-CM

## 2014-04-09 MED ORDER — LORATADINE 10 MG PO TABS: 10.0000 mg | ORAL_TABLET | Freq: Every day | ORAL | Status: AC

## 2014-04-09 MED ORDER — FLUTICASONE PROPIONATE 50 MCG/ACT NA SUSP: 2.0000 | Freq: Every day | NASAL | Status: DC

## 2014-04-09 MED ORDER — AMOXICILLIN 500 MG PO CAPS: 500.0000 mg | ORAL_CAPSULE | Freq: Three times a day (TID) | ORAL | Status: DC

## 2014-04-09 MED ORDER — OXYCODONE-ACETAMINOPHEN 5-325 MG PO TABS: 1.0000 | ORAL_TABLET | ORAL | Status: DC | PRN

## 2014-04-09 NOTE — Assessment & Plan Note (Signed)
Per above her dizziness on exertion which has not changed significantly since her stent placement. She's not had any actual chest pain or unstable angina symptoms. No lung disease

## 2014-04-09 NOTE — Assessment & Plan Note (Signed)
No current anginal symptoms. She will reschedule his cardiology. Her shortness of breath his chronic. She is deconditioned. Her weight is stable no evidence of fluid overload. Blood pressure also looked good. Cholesterol panel and liver function tests will be checked today.

## 2014-04-09 NOTE — Assessment & Plan Note (Signed)
I will have her discontinue the NSAID secondary to her blood thinner and she is also on aspirin. She will use tramadol and I've given her refill on Percocet which she sparingly uses for severe pain.

## 2014-04-09 NOTE — Assessment & Plan Note (Signed)
We'll treat based on duration of symptoms with amoxicillin Flonase will also add Claritin if she does get some fall allergies

## 2014-04-09 NOTE — Assessment & Plan Note (Signed)
Well controlled 

## 2014-04-09 NOTE — Progress Notes (Signed)
Patient ID: Gina Solis, female   DOB: 1961-01-23, 53 y.o.   MRN: [id number]   Subjective:    Patient ID: Gina Solis, female    DOB: 10/09/1960, 53 y.o.   MRN: [id number]  Patient presents for Allergies and Discuss bowels  patient here with maxillary sinus pressure swelling of her eyes postnasal drip for the past 2 weeks. She feels like she does a lot of congestion in the back of her throat as well she does cough up some in the morning. No significant cough no fever. No over-the-counter medications tried. She was out of her Flonase during this time.  Note she was seen in the interim by dermatology secondary to alopecia she's currently on Rogaine for this. It's possible that it was due to one of the medication she was given during her hospitalization but unclear.  Shortness of breath she is history of coronary artery disease status post stent she continues to have shortness of breath with exertion she missed her followup appointment with pulmonology this summer. Her weight has been stable she's not had any significant fluid buildup.  Yesterday she noted some limegreen colored stool her stool. She does take a stool softener as needed. She's not had any diarrhea no abdominal pain no blood in her stool.  Chronic pain she is back pain as well as ankle pain she was using tramadol and hydrocodone as needed. She also refilled diclofenac this weekend but has not used yet.    Review Of Systems:  GEN- denies fatigue, fever, weight loss,weakness, recent illness HEENT- denies eye drainage, change in vision, +nasal discharge, CVS- denies chest pain, palpitations RESP- +SOB, cough, wheeze ABD- denies N/V, change in stools, abd pain GU- denies dysuria, hematuria, dribbling, incontinence MSK- + joint pain, muscle aches, injury Neuro- denies headache, dizziness, syncope, seizure activity       Objective:    BP 138/74  Pulse 68  Temp(Src) 98.4 F (36.9 C) (Oral)  Resp 14  Ht 5\' 3"  (1.6 m)   Wt 254 lb (115.214 kg)  BMI 45.01 kg/m2 GEN- NAD, alert and oriented x3 HEENT- PERRL, EOMI, non injected sclera, pink conjunctiva, MMM, oropharynx mild injection, TM clear bilat no effusion,  + maxillary sinus tenderness, inflammed turbinates,  Nasal drainage  Neck- Supple, no LAD CVS- RRR, no murmur RESP-CTAB ABD-NABS,soft,NT,ND EXT- No edema Pulses- Radial 2+          Assessment & Plan:      Problem List Items Addressed This Visit   HYPERTENSION   HYPERLIPIDEMIA - Primary   Relevant Orders      Comprehensive metabolic panel      Lipid panel   Acute sinusitis   Relevant Medications      fluticasone (FLONASE) 50 MCG/ACT nasal spray      loratadine (CLARITIN) tablet 10 mg      amoxicillin (AMOXIL) capsule      Note: This dictation was prepared with Dragon dictation along with smaller phrase technology. Any transcriptional errors that result from this process are unintentional.

## 2014-04-09 NOTE — Patient Instructions (Signed)
Take antibiotics as prescribed Use flonase and allergy pill We will call with  Lab results Call your cardiologist for a follow-up  Flu shot given F/U 3 months

## 2014-04-10 ENCOUNTER — Encounter: Payer: Medicare Other | Admitting: Adult Health

## 2014-04-10 LAB — COMPREHENSIVE METABOLIC PANEL
ALK PHOS: 84 U/L (ref 39–117)
ALT: 18 U/L (ref 0–35)
AST: 13 U/L (ref 0–37)
Albumin: 4 g/dL (ref 3.5–5.2)
BILIRUBIN TOTAL: 0.4 mg/dL (ref 0.2–1.2)
BUN: 15 mg/dL (ref 6–23)
CO2: 28 mEq/L (ref 19–32)
CREATININE: 0.95 mg/dL (ref 0.50–1.10)
Calcium: 9.3 mg/dL (ref 8.4–10.5)
Chloride: 106 mEq/L (ref 96–112)
Glucose, Bld: 95 mg/dL (ref 70–99)
Potassium: 4.2 mEq/L (ref 3.5–5.3)
Sodium: 144 mEq/L (ref 135–145)
Total Protein: 7.1 g/dL (ref 6.0–8.3)

## 2014-04-10 LAB — LIPID PANEL
CHOL/HDL RATIO: 2.9 ratio
Cholesterol: 126 mg/dL (ref 0–200)
HDL: 43 mg/dL (ref >39)
LDL Cholesterol: 61 mg/dL (ref 0–99)
Triglycerides: 110 mg/dL (ref <150)
VLDL: 22 mg/dL (ref 0–40)

## 2014-04-10 NOTE — Progress Notes (Signed)
   Error Pt cancelled appt.

## 2014-04-11 NOTE — Progress Notes (Signed)
HPI: Gina Solis is a 53 year old female patient of Dr. Sherryle Lis in followup for ongoing assessment and management of CAD, status post cardiac catheterization in May 2015 revealing severe ostial LAD obstruction, which was successfully treated with drug-eluting stent to the proximal and ostial LAD. Other history includes hyperlipidemia, hypertension. He was last seen in the office in March of 2015, without any recurrent complaints. She was continued on dual antiplatelet therapy, and statins.  She comes today without any complaints. She continues to have deconditioning issues due to her weight, but tries to remain active. She denies any recurrent chest pain, significant dyspnea on exertion, or weakness. She would like to have breast reconstruction surgery on the left, and assessing how soon she is able to have this completed. Labs are currently being followed by Dr. Buelah Manis.  Allergies  Allergen Reactions  . Codeine Nausea Only    Current Outpatient Prescriptions  Medication Sig Dispense Refill  . acetaminophen (TYLENOL) 500 MG tablet Take 1,000 mg by mouth every 6 (six) hours as needed for mild pain.       Marland Kitchen amoxicillin (AMOXIL) 500 MG capsule Take 1 capsule (500 mg total) by mouth 3 (three) times daily.  30 capsule  0  . aspirin 81 MG tablet Take 1 tablet (81 mg total) by mouth daily.      Marland Kitchen atorvastatin (LIPITOR) 80 MG tablet Take 1 tablet (80 mg total) by mouth daily at 6 PM.  90 tablet  3  . dexlansoprazole (DEXILANT) 60 MG capsule Take 1 capsule (60 mg total) by mouth daily.  30 capsule  6  . fluticasone (FLONASE) 50 MCG/ACT nasal spray Place 2 sprays into both nostrils daily.  16 g  3  . furosemide (LASIX) 20 MG tablet Take 1 tablet (20 mg total) by mouth as needed.  30 tablet  3  . loratadine (CLARITIN) 10 MG tablet Take 1 tablet (10 mg total) by mouth daily.  30 tablet  6  . lubiprostone (AMITIZA) 24 MCG capsule Take 1 capsule (24 mcg total) by mouth 2 (two) times daily as needed for  constipation.  60 capsule  11  . metoprolol tartrate (LOPRESSOR) 25 MG tablet Take 1 tablet (25 mg total) by mouth 2 (two) times daily.  90 tablet  3  . nitroGLYCERIN (NITROSTAT) 0.4 MG SL tablet Place 0.4 mg under the tongue every 5 (five) minutes as needed for chest pain.      Marland Kitchen oxyCODONE-acetaminophen (PERCOCET/ROXICET) 5-325 MG per tablet Take 1 tablet by mouth every 4 (four) hours as needed for severe pain.  45 tablet  0  . Ticagrelor (BRILINTA) 90 MG TABS tablet Take 1 tablet (90 mg total) by mouth 2 (two) times daily.  60 tablet  11  . traMADol (ULTRAM) 50 MG tablet Take 1 tablet (50 mg total) by mouth every 6 (six) hours as needed for moderate pain.  45 tablet  2   No current facility-administered medications for this visit.    Past Medical History  Diagnosis Date  . Breast cancer 2008    a. diagnosed at stage IIIB  . Dermatomyositis   . Essential hypertension, benign   . Hypercholesteremia   . Arthritis   . GERD (gastroesophageal reflux disease)   . Lymphedema of arm     a. in left arm  . Multiple thyroid nodules   . 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 CAD (coronary artery disease)     a. s/p DES  to LAD (09/29/13)  . History of echocardiogram     a. mod concentric and severe basal septal hypertrophy. EF 60-65%, no WMA, g1dd. Mild MR/TR    Past Surgical History  Procedure Laterality Date  . Abdominal hysterectomy    . Tubal ligation  1985  . Esophagogastroduodenoscopy  05/02/2007    GNF:AOZHY hiatal hernia, otherwise normal stomach./Normal duodenal bulb and second portion of the duodenum/White plaques seen from the proximal to the distal esophagus/ Shallow ulcerations with mild erythema seen scattered throughout the esophagus most pronounced in the distal esophagus.  HSV1  . Colonoscopy with esophagogastroduodenoscopy (egd)  07/15/2012    SLF:Two sessile polyps measuring 3-4 mm in size were found in the ascending colon and rectum; polypectomy was  performed with cold forceps/ The colon mucosa was otherwise normal/Moderate sized internal hemorrhoids  . Breast reconstruction  left    X 2, trying again in spring.   . Mastectomy, partial  2008    Left-lumpectomy-axillary nodes x14  . Coronary angioplasty with stent placement  09/29/2013    ROS: Review of systems complete and found to be negative unless listed above  PHYSICAL EXAM BP 134/82  Pulse 81  Ht 5\' 3"  (1.6 m)  Wt 253 lb (114.76 kg)  BMI 44.83 kg/m2  SpO2 95% General: Well developed, well nourished, in no acute distress Head: Eyes PERRLA, No xanthomas.   Normal cephalic and atramatic  Lungs: Clear bilaterally to auscultation and percussion. Heart: HRRR S1 S2, without MRG.  Pulses are 2+ & equal.            No carotid bruit. No JVD.  No abdominal bruits. No femoral bruits. Abdomen: Bowel sounds are positive, abdomen soft and non-tender without masses or                  Hernia's noted. Msk:  Back normal, normal gait. Normal strength and tone for age. Extremities: No clubbing, cyanosis or edema.  DP +1 Neuro: Alert and oriented X 3. Psych:  Good affect, responds appropriately  ASSESSMENT AND PLAN

## 2014-04-12 ENCOUNTER — Encounter: Payer: Self-pay | Admitting: Adult Health

## 2014-04-12 ENCOUNTER — Ambulatory Visit (INDEPENDENT_AMBULATORY_CARE_PROVIDER_SITE_OTHER): Payer: Medicare Other | Admitting: Adult Health

## 2014-04-12 VITALS — BP 134/82 | HR 81 | Ht 63.0 in | Wt 253.0 lb

## 2014-04-12 DIAGNOSIS — I10 Essential (primary) hypertension: Secondary | ICD-10-CM

## 2014-04-12 DIAGNOSIS — I2510 Atherosclerotic heart disease of native coronary artery without angina pectoris: Secondary | ICD-10-CM

## 2014-04-12 DIAGNOSIS — E785 Hyperlipidemia, unspecified: Secondary | ICD-10-CM

## 2014-04-12 MED ORDER — METOPROLOL TARTRATE 25 MG PO TABS: 25.0000 mg | ORAL_TABLET | Freq: Two times a day (BID) | ORAL | Status: AC

## 2014-04-12 MED ORDER — ATORVASTATIN CALCIUM 80 MG PO TABS: 80.0000 mg | ORAL_TABLET | Freq: Every day | ORAL | Status: DC

## 2014-04-12 MED ORDER — FUROSEMIDE 20 MG PO TABS: 20.0000 mg | ORAL_TABLET | ORAL | Status: DC | PRN

## 2014-04-12 NOTE — Assessment & Plan Note (Signed)
She will be given refills on atorvastatin. Followup labs for primary care.

## 2014-04-12 NOTE — Progress Notes (Deleted)
Name: Gina Solis    DOB: [birth year]  Age: 53 y.o.  MR#: [id number]       PCP:  Vic Blackbird, MD      Insurance: Payor: MEDICARE / Plan: MEDICARE PART A AND B / Product Type: *No Product type* /   CC:    Chief Complaint  Patient presents with  . Coronary Artery Disease  . Hypertension    VS Filed Vitals:   04/12/14 1403  BP: 134/82  Pulse: 81  Height: 5\' 3"  (1.6 m)  Weight: 253 lb (114.76 kg)  SpO2: 95%    Weights Current Weight  04/12/14 253 lb (114.76 kg)  04/09/14 254 lb (115.214 kg)  12/12/13 253 lb 14.4 oz (115.168 kg)    Blood Pressure  BP Readings from Last 3 Encounters:  04/12/14 134/82  04/09/14 138/74  12/12/13 137/61     Admit date:  (Not on file) Last encounter with RMR:  10/16/2013   Allergy Codeine  Current Outpatient Prescriptions  Medication Sig Dispense Refill  . acetaminophen (TYLENOL) 500 MG tablet Take 1,000 mg by mouth every 6 (six) hours as needed for mild pain.       Marland Kitchen amoxicillin (AMOXIL) 500 MG capsule Take 1 capsule (500 mg total) by mouth 3 (three) times daily.  30 capsule  0  . aspirin 81 MG tablet Take 1 tablet (81 mg total) by mouth daily.      Marland Kitchen atorvastatin (LIPITOR) 80 MG tablet Take 1 tablet (80 mg total) by mouth daily at 6 PM.  30 tablet  6  . dexlansoprazole (DEXILANT) 60 MG capsule Take 1 capsule (60 mg total) by mouth daily.  30 capsule  6  . fluticasone (FLONASE) 50 MCG/ACT nasal spray Place 2 sprays into both nostrils daily.  16 g  3  . furosemide (LASIX) 20 MG tablet Take 1 tablet (20 mg total) by mouth daily.  30 tablet  3  . loratadine (CLARITIN) 10 MG tablet Take 1 tablet (10 mg total) by mouth daily.  30 tablet  6  . lubiprostone (AMITIZA) 24 MCG capsule Take 1 capsule (24 mcg total) by mouth 2 (two) times daily as needed for constipation.  60 capsule  11  . metoprolol tartrate (LOPRESSOR) 25 MG tablet Take 1 tablet (25 mg total) by mouth 2 (two) times daily.  60 tablet  6  . nitroGLYCERIN (NITROSTAT) 0.4 MG SL  tablet Place 0.4 mg under the tongue every 5 (five) minutes as needed for chest pain.      Marland Kitchen oxyCODONE-acetaminophen (PERCOCET/ROXICET) 5-325 MG per tablet Take 1 tablet by mouth every 4 (four) hours as needed for severe pain.  45 tablet  0  . Ticagrelor (BRILINTA) 90 MG TABS tablet Take 1 tablet (90 mg total) by mouth 2 (two) times daily.  60 tablet  11  . traMADol (ULTRAM) 50 MG tablet Take 1 tablet (50 mg total) by mouth every 6 (six) hours as needed for moderate pain.  45 tablet  2   No current facility-administered medications for this visit.    Discontinued Meds:   There are no discontinued medications.  Patient Active Problem List   Diagnosis Date Noted  . Acute sinusitis 04/09/2014  . OA (osteoarthritis) 04/09/2014  . Constipation 11/02/2013  . CAD (coronary artery disease)   . History of echocardiogram   . Other and unspecified angina pectoris 09/28/2013  . Abnormal myocardial perfusion study 09/27/2013  . SOB (shortness of breath) on exertion 04/24/2013  . Leg edema  04/24/2013  . Dermatomyositis 09/20/2012  . Alopecia 07/26/2012  . GERD (gastroesophageal reflux disease) 06/21/2012  . Hemorrhoids 11/20/2011  . Laryngopharyngeal reflux (LPR) 11/20/2011  . LYMPHEDEMA, LEFT ARM 09/04/2008  . THYROID NODULE 04/05/2007  . HYPERLIPIDEMIA 02/22/2007  . Morbid obesity 02/22/2007  . HYPERTENSION 02/22/2007  . GERD 02/22/2007  . BREAST CANCER, HX OF 02/22/2007    LABS    Component Value Date/Time   NA 144 04/09/2014 1232   NA 142 12/13/2013 0914   NA 141 09/29/2013 0555   K 4.2 04/09/2014 1232   K 3.6* 12/13/2013 0914   K 3.7 09/29/2013 0555   CL 106 04/09/2014 1232   CL 102 12/13/2013 0914   CL 102 09/29/2013 0555   CO2 28 04/09/2014 1232   CO2 25 12/13/2013 0914   CO2 26 09/29/2013 0555   GLUCOSE 95 04/09/2014 1232   GLUCOSE 102* 12/13/2013 0914   GLUCOSE 100* 09/29/2013 0555   BUN 15 04/09/2014 1232   BUN 17 12/13/2013 0914   BUN 10 09/29/2013 0555   CREATININE 0.95 04/09/2014 1232    CREATININE 0.98 12/13/2013 0914   CREATININE 0.97 09/29/2013 0555   CREATININE 0.97 09/28/2013 0830   CREATININE 1.10 04/24/2013 0853   CREATININE 1.02 03/06/2013 1103   CALCIUM 9.3 04/09/2014 1232   CALCIUM 9.0 12/13/2013 0914   CALCIUM 9.3 09/29/2013 0555   GFRNONAA 65* 12/13/2013 0914   GFRNONAA 66* 09/29/2013 0555   GFRNONAA 66* 09/28/2013 0830   GFRAA 76* 12/13/2013 0914   GFRAA 76* 09/29/2013 0555   GFRAA 76* 09/28/2013 0830   CMP     Component Value Date/Time   NA 144 04/09/2014 1232   K 4.2 04/09/2014 1232   CL 106 04/09/2014 1232   CO2 28 04/09/2014 1232   GLUCOSE 95 04/09/2014 1232   BUN 15 04/09/2014 1232   CREATININE 0.95 04/09/2014 1232   CREATININE 0.98 12/13/2013 0914   CALCIUM 9.3 04/09/2014 1232   PROT 7.1 04/09/2014 1232   ALBUMIN 4.0 04/09/2014 1232   AST 13 04/09/2014 1232   ALT 18 04/09/2014 1232   ALKPHOS 84 04/09/2014 1232   BILITOT 0.4 04/09/2014 1232   GFRNONAA 65* 12/13/2013 0914   GFRAA 76* 12/13/2013 0914       Component Value Date/Time   WBC 6.9 12/13/2013 0914   WBC 7.3 09/29/2013 0555   WBC 6.8 09/28/2013 0830   HGB 11.4* 12/13/2013 0914   HGB 12.9 09/29/2013 0555   HGB 12.8 09/28/2013 0830   HCT 35.5* 12/13/2013 0914   HCT 38.5 09/29/2013 0555   HCT 38.9 09/28/2013 0830   MCV 85.7 12/13/2013 0914   MCV 84.8 09/29/2013 0555   MCV 85.5 09/28/2013 0830    Lipid Panel     Component Value Date/Time   CHOL 126 04/09/2014 1232   TRIG 110 04/09/2014 1232   HDL 43 04/09/2014 1232   CHOLHDL 2.9 04/09/2014 1232   VLDL 22 04/09/2014 1232   LDLCALC 61 04/09/2014 1232    ABG    Component Value Date/Time   TCO2 28 04/14/2010 0659     Lab Results  Component Value Date   TSH 0.873 03/06/2013   BNP (last 3 results) No results found for this basename: PROBNP,  in the last 8760 hours Cardiac Panel (last 3 results) No results found for this basename: CKTOTAL, CKMB, TROPONINI, RELINDX,  in the last 72 hours  Iron/TIBC/Ferritin/ %Sat No results found for this basename: iron, tibc, ferritin,  ironpctsat  EKG Orders placed during the hospital encounter of 09/28/13  . EKG 12-LEAD  . EKG 12-LEAD  . EKG 12-LEAD  . EKG 12-LEAD     Prior Assessment and Plan Problem List as of 04/12/2014     Cardiovascular and Mediastinum   HYPERTENSION   Last Assessment & Plan   04/09/2014 Office Visit Written 04/09/2014 12:42 PM by Alycia Rossetti, MD     Well-controlled    Hemorrhoids   Other and unspecified angina pectoris   CAD (coronary artery disease)   Last Assessment & Plan   04/09/2014 Office Visit Written 04/09/2014 12:41 PM by Alycia Rossetti, MD     No current anginal symptoms. She will reschedule his cardiology. Her shortness of breath his chronic. She is deconditioned. Her weight is stable no evidence of fluid overload. Blood pressure also looked good. Cholesterol panel and liver function tests will be checked today.      Respiratory   Laryngopharyngeal reflux (LPR)   Last Assessment & Plan   11/20/2011 Office Visit Written 11/20/2011 11:35 AM by Mahala Menghini, PA     ?hoarseness secondary to LPR based on her description of ENT evaluation. Never tried PPI BID. Worth trying increase omeprazole for 3 months but if no significant improvement in hoarseness then she will go back to once daily dosing.    Acute sinusitis   Last Assessment & Plan   04/09/2014 Office Visit Written 04/09/2014 12:40 PM by Alycia Rossetti, MD     We'll treat based on duration of symptoms with amoxicillin Flonase will also add Claritin if she does get some fall allergies      Digestive   GERD   Last Assessment & Plan   11/02/2013 Office Visit Edited 11/02/2013 12:10 PM by Mahala Menghini, PA-C     Doing well at this time. Chest pain resolved status post LAD stent. Chronic hoarseness previously evaluated by ENT several years ago. Etiology unclear. May benefit from [place] given dual release mechanism. May take up to 2-3 months to notice significant benefit of ?LPR.  Office visit in one year or sooner if  needed    GERD (gastroesophageal reflux disease)   Last Assessment & Plan   06/21/2012 Office Visit Written 06/21/2012  3:39 PM by Orvil Feil, NP      Chronic, improvement of symptoms with PPI BID. However, notes intermittent dysphagia. Last EGD in remote past by Dr. Oneida Alar, 2008. Dysphagia may be secondary to uncontrolled GERD, query web, ring, or stricture.   Continue Prilosec BID Proceed with upper endoscopy in the near future with Dr. Oneida Alar. The risks, benefits, and alternatives have been discussed in detail with patient. They have stated understanding and desire to proceed.      Constipation   Last Assessment & Plan   11/02/2013 Office Visit Written 11/02/2013 12:08 PM by Mahala Menghini, PA-C     Managed with [facility]. New prescription provided.      Endocrine   THYROID NODULE     Musculoskeletal and Integument   Alopecia   Last Assessment & Plan   07/25/2012 Office Visit Written 07/26/2012  9:25 PM by Alycia Rossetti, MD     Referral to dermatology, advised nizoral shampoo, she seems to have a scarring type of alopecia, hair growth may be very limited    Dermatomyositis   OA (osteoarthritis)   Last Assessment & Plan   04/09/2014 Office Visit Written 04/09/2014 12:44 PM by Alycia Rossetti, MD  I will have her discontinue the NSAID secondary to her blood thinner and she is also on aspirin. She will use tramadol and I've given her refill on Percocet which she sparingly uses for severe pain.      Other   HYPERLIPIDEMIA   Last Assessment & Plan   10/16/2013 Office Visit Written 10/16/2013  4:39 PM by Lendon Colonel, NP     She is recently been started on atorvastatin 80 mg daily. Our plan is to continue her on this medication and check lipids and LFTs in 3 months on next appointment. She has been advised on low cholesterol diet. And will have reinforcement of this with cardiac rehabilitation when she begins the program.    Morbid obesity   Last Assessment & Plan    11/07/2013 Office Visit Written 11/08/2013 10:44 PM by Alycia Rossetti, MD     Will start cardiac rehab, which will also help with weight    LYMPHEDEMA, LEFT ARM   BREAST CANCER, HX OF   Last Assessment & Plan   06/16/2013 Office Visit Written 06/16/2013  2:15 PM by Satira Sark, MD     Infiltrating ductal carcinoma, stage IIIB, status post chemotherapy, radiation, and surgery as outlined.    SOB (shortness of breath) on exertion   Last Assessment & Plan   04/09/2014 Office Visit Written 04/09/2014 12:42 PM by Alycia Rossetti, MD     Per above her dizziness on exertion which has not changed significantly since her stent placement. She's not had any actual chest pain or unstable angina symptoms. No lung disease    Leg edema   Last Assessment & Plan   11/07/2013 Office Visit Written 11/08/2013 10:42 PM by Alycia Rossetti, MD     Trial of lasix    Abnormal myocardial perfusion study   Last Assessment & Plan   09/27/2013 Office Visit Written 09/27/2013  9:21 AM by Satira Sark, MD     Anginal chest pain with abnormal ST changes and perfusion evidence of LAD ischemia, LVEF 59%.    History of echocardiogram       Imaging: No results found.

## 2014-04-12 NOTE — Assessment & Plan Note (Signed)
She seemed very well from a cardiac standpoint without any recurrent symptoms of angina. I have advised her that she should not have any elective surgery until after March of 2016 and she will need to stay on the Brilinta for at least one year with a drug-eluting stent. She verbalizes understanding. She states that this is only cosmetic and she is willing to wait until he is safe for her to proceed. She will continue on statin and beta blocker therapy along with aspirin. We will see her again in 6 months unless she becomes symptomatic.

## 2014-04-12 NOTE — Assessment & Plan Note (Signed)
Blood pressure has excellent control. However, not make any changes in her medication regimen. She is recently had labs completed her primary care physician. Of note, she takes Lasix as needed, and not on a daily basis. This will be changed.

## 2014-04-12 NOTE — Patient Instructions (Addendum)
Your physician wants you to follow-up in: 6 months You will receive a reminder letter in the mail two months in advance. If you don't receive a letter, please call our office to schedule the follow-up appointment.      Your physician has recommended you make the following change in your medication:     Take lasix only as needed        Thank you for choosing [facility] !

## 2014-04-13 ENCOUNTER — Encounter: Payer: Self-pay | Admitting: *Deleted

## 2014-05-28 ENCOUNTER — Encounter: Payer: Self-pay | Admitting: Adult Health

## 2014-05-31 ENCOUNTER — Telehealth: Payer: Self-pay | Admitting: Family Medicine

## 2014-05-31 NOTE — Telephone Encounter (Signed)
Gina Solis from dr wents's office calling to ask some questions about getting medical clearance for this patient  Please call her back at [phone]

## 2014-05-31 NOTE — Telephone Encounter (Signed)
Call placed to Dr. Martin Majestic, [place].   Was advised that patient has been seen and requires extraction of (1) tooth.   Reports that patient had stints placed in 09/2013 and is currently taking Brillenta.   Requested clearance to hold medication for extraction.   MD please advise.

## 2014-06-01 ENCOUNTER — Telehealth: Payer: Self-pay | Admitting: *Deleted

## 2014-06-01 NOTE — Telephone Encounter (Signed)
Pt states that her dentist office had faxed [place] yesterday and they are needing clearance to surgically remove tooth.  Dr Moshe Cipro telephone also states that patient needs to get clearance from [place]. [phone] Elissa Hefty with DR. Eulis Foster office. I think the office called Dr Moshe Cipro instead of out office.

## 2014-06-01 NOTE — Telephone Encounter (Signed)
Call placed to Dr. Jerene Bears office and message left on VM to contact cardiologist.

## 2014-06-01 NOTE — Telephone Encounter (Signed)
Patient needs to contact her cardiologist to see if this medication can be stopped, they need to provide a letter or call the dentist directly

## 2014-06-01 NOTE — Telephone Encounter (Signed)
Pt has our fax number and is going to ask the oral surgeon to send letter to dr.mcdowell for brilinta instructions

## 2014-06-01 NOTE — Telephone Encounter (Signed)
Form from dentist in Dr McDowell's folder

## 2014-06-02 NOTE — Telephone Encounter (Signed)
Reviewed. This patient should not stop dual antiplatelet therapy (aspirin and Brilinta) at this time. She had a drug-eluting stent in her LAD in March of this year, and needs to complete 1 year's treatment, unless need for surgery is urgent or emergent. If dental work can be done on both medications, this would be acceptable.

## 2014-06-03 NOTE — Telephone Encounter (Signed)
I am forwarding this to your attention, not sure how it got routed to me

## 2014-06-04 ENCOUNTER — Telehealth: Payer: Self-pay | Admitting: Cardiology

## 2014-06-04 DIAGNOSIS — Z79899 Other long term (current) drug therapy: Secondary | ICD-10-CM

## 2014-06-04 NOTE — Telephone Encounter (Signed)
Faxed dr.mcdowell's response to dentist

## 2014-06-04 NOTE — Telephone Encounter (Signed)
LMTCB

## 2014-06-04 NOTE — Telephone Encounter (Signed)
Patient needs to speak with nurse regarding blood work  tgs

## 2014-06-05 ENCOUNTER — Other Ambulatory Visit: Payer: Self-pay

## 2014-06-05 DIAGNOSIS — Z79899 Other long term (current) drug therapy: Secondary | ICD-10-CM

## 2014-06-05 LAB — CBC
HCT: 37.5 % (ref 36.0–46.0)
Hemoglobin: 12 g/dL (ref 12.0–15.0)
MCH: 27.8 pg (ref 26.0–34.0)
MCHC: 32 g/dL (ref 30.0–36.0)
MCV: 86.8 fL (ref 78.0–100.0)
PLATELETS: 292 10*3/uL (ref 150–400)
RBC: 4.32 MIL/uL (ref 3.87–5.11)
RDW: 14.6 % (ref 11.5–15.5)
WBC: 7.9 10*3/uL (ref 4.0–10.5)

## 2014-06-05 NOTE — Telephone Encounter (Signed)
After discussion with Dr.McDowell CBC ordered which we will forward to oral surgeon.Pt understands that while her platelets will be normal she can still bleed at extraction site .She verbalized understanding

## 2014-06-14 ENCOUNTER — Ambulatory Visit (HOSPITAL_COMMUNITY): Payer: Medicare Other

## 2014-06-14 ENCOUNTER — Other Ambulatory Visit (HOSPITAL_COMMUNITY): Payer: Medicare Other

## 2014-06-14 NOTE — Progress Notes (Signed)
This encounter was created in error - please disregard.

## 2014-06-20 ENCOUNTER — Other Ambulatory Visit: Payer: Self-pay | Admitting: *Deleted

## 2014-06-20 MED ORDER — ATORVASTATIN CALCIUM 80 MG PO TABS: 80.0000 mg | ORAL_TABLET | Freq: Every day | ORAL | Status: AC

## 2014-06-25 ENCOUNTER — Encounter (HOSPITAL_COMMUNITY): Payer: Self-pay

## 2014-07-03 ENCOUNTER — Other Ambulatory Visit (HOSPITAL_COMMUNITY): Payer: Self-pay | Admitting: Oncology

## 2014-07-03 DIAGNOSIS — Z09 Encounter for follow-up examination after completed treatment for conditions other than malignant neoplasm: Secondary | ICD-10-CM

## 2014-07-04 ENCOUNTER — Ambulatory Visit: Payer: Medicare Other | Admitting: Family Medicine

## 2014-07-05 ENCOUNTER — Encounter (HOSPITAL_COMMUNITY): Payer: Self-pay | Admitting: Interventional Cardiology

## 2014-07-09 ENCOUNTER — Ambulatory Visit: Payer: Medicare Other | Admitting: Family Medicine

## 2014-07-19 ENCOUNTER — Encounter (HOSPITAL_BASED_OUTPATIENT_CLINIC_OR_DEPARTMENT_OTHER): Payer: Medicare Other

## 2014-07-19 ENCOUNTER — Encounter (HOSPITAL_COMMUNITY): Payer: Medicare Other | Attending: Hematology and Oncology | Admitting: Hematology & Oncology

## 2014-07-19 ENCOUNTER — Encounter (HOSPITAL_COMMUNITY): Payer: Self-pay | Admitting: Hematology & Oncology

## 2014-07-19 ENCOUNTER — Ambulatory Visit (HOSPITAL_COMMUNITY)
Admission: RE | Admit: 2014-07-19 | Discharge: 2014-07-19 | Disposition: A | Discharge status: Home / Self Care | Payer: Commercial Managed Care - HMO | Source: Ambulatory Visit | Attending: Hematology & Oncology | Admitting: Hematology & Oncology

## 2014-07-19 VITALS — BP 151/76 | HR 84 | Temp 98.1°F | Resp 18 | Wt 252.0 lb

## 2014-07-19 DIAGNOSIS — N951 Menopausal and female climacteric states: Secondary | ICD-10-CM | POA: Insufficient documentation

## 2014-07-19 DIAGNOSIS — Z9012 Acquired absence of left breast and nipple: Secondary | ICD-10-CM | POA: Diagnosis not present

## 2014-07-19 DIAGNOSIS — R0602 Shortness of breath: Secondary | ICD-10-CM | POA: Diagnosis not present

## 2014-07-19 DIAGNOSIS — Z853 Personal history of malignant neoplasm of breast: Secondary | ICD-10-CM

## 2014-07-19 DIAGNOSIS — Z95818 Presence of other cardiac implants and grafts: Secondary | ICD-10-CM | POA: Insufficient documentation

## 2014-07-19 DIAGNOSIS — I890 Lymphedema, not elsewhere classified: Secondary | ICD-10-CM

## 2014-07-19 DIAGNOSIS — I2510 Atherosclerotic heart disease of native coronary artery without angina pectoris: Secondary | ICD-10-CM | POA: Diagnosis not present

## 2014-07-19 DIAGNOSIS — Z90710 Acquired absence of both cervix and uterus: Secondary | ICD-10-CM

## 2014-07-19 LAB — CBC WITH DIFFERENTIAL/PLATELET
BASOS ABS: 0 10*3/uL (ref 0.0–0.1)
Basophils Relative: 0 % (ref 0–1)
Eosinophils Absolute: 0.2 10*3/uL (ref 0.0–0.7)
Eosinophils Relative: 3 % (ref 0–5)
HEMATOCRIT: 38.3 % (ref 36.0–46.0)
Hemoglobin: 12.2 g/dL (ref 12.0–15.0)
LYMPHS ABS: 2.6 10*3/uL (ref 0.7–4.0)
LYMPHS PCT: 38 % (ref 12–46)
MCH: 27.7 pg (ref 26.0–34.0)
MCHC: 31.9 g/dL (ref 30.0–36.0)
MCV: 86.8 fL (ref 78.0–100.0)
MONO ABS: 0.2 10*3/uL (ref 0.1–1.0)
Monocytes Relative: 3 % (ref 3–12)
NEUTROS ABS: 3.7 10*3/uL (ref 1.7–7.7)
Neutrophils Relative %: 56 % (ref 43–77)
PLATELETS: 278 10*3/uL (ref 150–400)
RBC: 4.41 MIL/uL (ref 3.87–5.11)
RDW: 14.3 % (ref 11.5–15.5)
WBC: 6.7 10*3/uL (ref 4.0–10.5)

## 2014-07-19 LAB — COMPREHENSIVE METABOLIC PANEL
ALT: 19 U/L (ref 0–35)
AST: 18 U/L (ref 0–37)
Albumin: 3.9 g/dL (ref 3.5–5.2)
Alkaline Phosphatase: 86 U/L (ref 39–117)
Anion gap: 6 (ref 5–15)
BILIRUBIN TOTAL: 0.5 mg/dL (ref 0.3–1.2)
BUN: 16 mg/dL (ref 6–23)
CHLORIDE: 106 meq/L (ref 96–112)
CO2: 28 mmol/L (ref 19–32)
Calcium: 9.2 mg/dL (ref 8.4–10.5)
Creatinine, Ser: 1.01 mg/dL (ref 0.50–1.10)
GFR calc Af Amer: 72 mL/min — ABNORMAL LOW (ref >90)
GFR, EST NON AFRICAN AMERICAN: 62 mL/min — AB (ref >90)
Glucose, Bld: 110 mg/dL — ABNORMAL HIGH (ref 70–99)
Potassium: 3.8 mmol/L (ref 3.5–5.1)
SODIUM: 140 mmol/L (ref 135–145)
Total Protein: 7.9 g/dL (ref 6.0–8.3)

## 2014-07-19 LAB — ESTRADIOL: Estradiol: 23.1 pg/mL

## 2014-07-19 LAB — CANCER ANTIGEN 27.29: CA 27.29: 13 U/mL (ref 0–39)

## 2014-07-19 LAB — CEA: CEA: <0.5 ng/mL (ref 0.0–5.0)

## 2014-07-19 LAB — FOLLICLE STIMULATING HORMONE: FSH: 84.5 m[IU]/mL

## 2014-07-19 NOTE — Progress Notes (Signed)
STAR Program Physical Impairment and Functional Assessment Screening Tool  1. Are you having any pain, including headaches, joint pain, or muscle pain (upper body = OT; lower body = PT)?  Yes, this started after my diagnosis and is still a problem.  2. Do your hands and/or feet feel numb or tingle (PT)?  No  3. Does any part of your body feel swollen or larger than usual (upper body = OT; lower body = PT)?  Yes, this started after my diagnosis and is still a problem.  4. Are you so tired that you cannot do the things you want or need to do (PT or OT)?  No  5. Are you feeling weak or are you having trouble moving any part of your body (PT/OT)?  No  6. Are you having trouble concentrating, thinking, or remembering things (OT/ST)?  No  7. Are you having trouble moving around or feel like you might trip or fall (PT)?  No  8. Are you having trouble swallowing (ST)?  Yes, but I hand this before my cancer diagnosis.  9. Are you having trouble speaking (ST)?  No  10. Are you having trouble with going or getting to the bathroom (OT)?  No  11. Are you having trouble with your sexual function (OT)?  No  12. Are you having trouble lifting things, even just your arms (OT/PT)?  Yes, this started after my diagnosis and is still a problem.  39. Are you having trouble taking care of yourself as in dressing or bathing (OT)?  No  14. Are you having trouble with daily tasks like chores or shopping (OT)?  No  15. Are you having trouble driving (OT)?  No  16. Are you having trouble returning to work or completing your tasks at work (OT)?  No  Other concerns: "Shortness of breath"  Legend: OT = Occupational Therapy PT = Physical Therapy ST = Speech Therapy

## 2014-07-19 NOTE — Patient Instructions (Signed)
[facility] Discharge Instructions  RECOMMENDATIONS MADE BY THE CONSULTANT AND ANY TEST RESULTS WILL BE SENT TO YOUR REFERRING PHYSICIAN.   I have check hormone levels today. We will call you with the results when they are available. We will call you with the results of your chest x-ray. Try Benadryl several times a day by mouth for your hives. Call if they do not improve. Please call in the interim with any problems or concerns. Happy Holidays!!  Thank you for choosing [facility] to provide your oncology and hematology care.  To afford each patient quality time with our providers, please arrive at least 15 minutes before your scheduled appointment time.  With your help, our goal is to use those 15 minutes to complete the necessary work-up to ensure our physicians have the information they need to help with your evaluation and healthcare recommendations.    Effective January 1st, 2014, we ask that you re-schedule your appointment with our physicians should you arrive 10 or more minutes late for your appointment.  We strive to give you quality time with our providers, and arriving late affects you and other patients whose appointments are after yours.    Again, thank you for choosing [facility].  Our hope is that these requests will decrease the amount of time that you wait before being seen by our physicians.       _____________________________________________________________  Should you have questions after your visit to [facility], please contact our office at [phone] between the hours of 8:30 a.m. and 5:00 p.m.  Voicemails left after 4:30 p.m. will not be returned until the following business day.  For prescription refill requests, have your pharmacy contact our office with your prescription refill request.

## 2014-07-19 NOTE — Progress Notes (Signed)
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 1.2 cm in size at time of resection with 2/15 nodes positive for disease. LVI identified.   S/P preoperative dose dense FEC x 6 cycles followed by surgery and then radiation.  Lreast implant which had to be removed due to infection. She has had 2 failed implants.   Dermatomyositis at presentation  INTERVAL HISTORY: Gina Solis 53 y.o. female returns for folllowup of previously treated Stage IIIB triple negative breast cancer. She has noticed worsening SOB over the past few months. She has also noticed "hives" starting about a month ago. She has tried benadryl cream which has helped but they still persist. Due for repeat unilateral mammogram on 07/31/2014  MEDICAL HISTORY: Past Medical History  Diagnosis Date  . Breast cancer 2008    a. diagnosed at stage IIIB  . Dermatomyositis   . Essential hypertension, benign   . Hypercholesteremia   . Arthritis   . GERD (gastroesophageal reflux disease)   . Lymphedema of arm     a. in left arm  . Multiple thyroid nodules   . 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 CAD (coronary artery disease)     a. s/p DES to LAD (09/29/13)  . History of echocardiogram     a. mod concentric and severe basal septal hypertrophy. EF 60-65%, no WMA, g1dd. Mild MR/TR    has THYROID NODULE; HYPERLIPIDEMIA; Morbid obesity; HYPERTENSION; LYMPHEDEMA, LEFT ARM; GERD; BREAST CANCER, HX OF; Hemorrhoids; Laryngopharyngeal reflux (LPR); GERD (gastroesophageal reflux disease); Alopecia; Dermatomyositis; SOB (shortness of breath) on exertion; Leg edema; Abnormal myocardial perfusion study; Other and unspecified angina pectoris;  CAD (coronary artery disease); History of echocardiogram; Constipation; Acute sinusitis; and OA (osteoarthritis) on her problem list.     No history exists.     is allergic to codeine.  Gina Solis does not currently have medications on file.  SURGICAL HISTORY: Past Surgical History  Procedure Laterality Date  . Abdominal hysterectomy    . Tubal ligation  1985  . Esophagogastroduodenoscopy  05/02/2007    OJJ:KKXFG hiatal hernia, otherwise normal stomach./Normal duodenal bulb and second portion of the duodenum/White plaques seen from the proximal to the distal esophagus/ Shallow ulcerations with mild erythema seen scattered throughout the esophagus most pronounced in the distal esophagus.  HSV1  . Colonoscopy with esophagogastroduodenoscopy (egd)  07/15/2012    SLF:Two sessile polyps measuring 3-4 mm in size were found in the ascending colon and rectum; polypectomy was performed with cold forceps/ The colon mucosa was otherwise normal/Moderate sized internal hemorrhoids  . Breast reconstruction  left    X 2, trying again in spring.   . Mastectomy, partial  2008    Left-lumpectomy-axillary nodes x14  . Coronary angioplasty with stent placement  09/29/2013  . Left heart catheterization with coronary angiogram N/A 09/28/2013    Procedure: LEFT HEART CATHETERIZATION WITH CORONARY ANGIOGRAM;  Surgeon: Sinclair Grooms, MD;  Location: [facility] CATH LAB;  Service: Cardiovascular;  Laterality: N/A;    SOCIAL HISTORY: History   Social History  . Marital Status: Divorced    Spouse Name: N/A    Number of Children: 3  . Years of Education: N/A   Occupational  History  . disabled    Social History Main Topics  . Smoking status: Former Smoker -- 0.50 packs/day for 2 years    Types: Cigarettes    Quit date: 07/27/2002  . Smokeless tobacco: Never Used     Comment: quit years ago  . Alcohol Use: No  . Drug Use: No  . Sexual Activity: Not Currently    Birth Control/ Protection: Surgical   Other  Topics Concern  . Not on file   Social History Narrative    FAMILY HISTORY: Family History  Problem Relation Age of Onset  . Breast cancer Paternal Aunt   . Colon cancer Maternal Uncle     age 74s  . Kidney failure Mother   . Heart failure Mother   . Colon cancer Paternal Grandfather     ???  . Hyperlipidemia Father   . Heart murmur Sister   . Diabetes Brother   . Hyperlipidemia Brother   . Hypertension Brother   . Hypertension Sister   . Arthritis Sister   . Cancer Maternal Grandmother   . Diabetes Maternal Grandmother   . Heart disease Maternal Grandfather   . Sickle cell trait Daughter   . Heart attack Paternal Uncle   . Dementia Paternal Uncle     Review of Systems  Constitutional: Negative.   HENT: Negative.   Eyes: Negative.   Respiratory: Positive for shortness of breath. Negative for cough, hemoptysis, sputum production and wheezing.   Cardiovascular: Negative.   Gastrointestinal: Negative.   Genitourinary: Negative.   Musculoskeletal: Negative.   Skin:       Hives intermittently  Neurological: Negative.   Endo/Heme/Allergies: Negative.   Psychiatric/Behavioral: Negative.     PHYSICAL EXAMINATION  ECOG PERFORMANCE STATUS: 0 - Asymptomatic  Filed Vitals:   07/19/14 1027  BP: 151/76  Pulse: 84  Temp: 98.1 F (36.7 C)  Resp: 18    Physical Exam  Constitutional: She is oriented to person, place, and time and well-developed, well-nourished, and in no distress.  HENT:  Head: Normocephalic and atraumatic.  Nose: Nose normal.  Mouth/Throat: Oropharynx is clear and moist. No oropharyngeal exudate.  Eyes: Conjunctivae and EOM are normal. Pupils are equal, round, and reactive to light. Right eye exhibits no discharge. Left eye exhibits no discharge. No scleral icterus.  Neck: Normal range of motion. Neck supple. No tracheal deviation present. No thyromegaly present.  Cardiovascular: Normal rate, regular rhythm and normal heart sounds.  Exam reveals no  gallop and no friction rub.   No murmur heard. Pulmonary/Chest: Effort normal and breath sounds normal. She has no wheezes. She has no rales.  Abdominal: Soft. Bowel sounds are normal. She exhibits no distension and no mass. There is no tenderness. There is no rebound and no guarding.  Musculoskeletal: Normal range of motion. She exhibits edema.  LUE lymphedema  Lymphadenopathy:    She has no cervical adenopathy.  Neurological: She is alert and oriented to person, place, and time. She has normal reflexes. No cranial nerve deficit. Gait normal. Coordination normal.  Skin: Skin is warm and dry. No rash noted.  Psychiatric: Mood, memory, affect and judgment normal.  Nursing note and vitals reviewed. BREAST:right breast normal without mass, skin or nipple changes or axillary nodes, left post-mastectomy site well healed and free of suspicious changes. Scar tissue and expected skin changes  LABORATORY DATA:  CBC    Component Value Date/Time   WBC 6.7 07/19/2014 1015   RBC 4.41 07/19/2014 1015   HGB 12.2 07/19/2014  1015   HCT 38.3 07/19/2014 1015   PLT 278 07/19/2014 1015   MCV 86.8 07/19/2014 1015   MCH 27.7 07/19/2014 1015   MCHC 31.9 07/19/2014 1015   RDW 14.3 07/19/2014 1015   LYMPHSABS 2.6 07/19/2014 1015   MONOABS 0.2 07/19/2014 1015   EOSABS 0.2 07/19/2014 1015   BASOSABS 0.0 07/19/2014 1015   CMP     Component Value Date/Time   NA 140 07/19/2014 1015   K 3.8 07/19/2014 1015   CL 106 07/19/2014 1015   CO2 28 07/19/2014 1015   GLUCOSE 110* 07/19/2014 1015   BUN 16 07/19/2014 1015   CREATININE 1.01 07/19/2014 1015   CREATININE 0.95 04/09/2014 1232   CALCIUM 9.2 07/19/2014 1015   PROT 7.9 07/19/2014 1015   ALBUMIN 3.9 07/19/2014 1015   AST 18 07/19/2014 1015   ALT 19 07/19/2014 1015   ALKPHOS 86 07/19/2014 1015   BILITOT 0.5 07/19/2014 1015   GFRNONAA 62* 07/19/2014 1015   GFRAA 72* 07/19/2014 1015     EXAM: DIGITAL DIAGNOSTIC BILATERAL MAMMOGRAM WITH  CAD  ULTRASOUND RIGHT BREAST  COMPARISON: 01/18/2013 and earlier  IMPRESSION: 1. Expected postoperative changes on the left. 2. Dense fibroglandular tissue without mass in the lateral aspect of the right breast. 3. Small benign-appearing nodule in the lower inner quadrant of the right breast. Followup is recommended.  RECOMMENDATION: Right diagnostic mammogram and ultrasound suggested in 6 months.  I have discussed the findings and recommendations with the patient. Results were also provided in writing at the conclusion of the visit. If applicable, a reminder letter will be sent to the patient regarding the next appointment.  BI-RADS CATEGORY 3: Probably benign.   Electronically Signed  By: Shon Hale M.D.  On: 01/24/2014 10:17  ASSESSMENT and THERAPY PLAN:    BREAST CANCER, HX OF Pleasant 53 year old female with a history of stage IIIB carcinoma of the left breast. Disease was triple negative high-grade. She is now almost 8 years out from diagnosis she completed all recommended therapy. She has lymphedema of the left arm. He discussed referral to the program for additional management of her lymphedema. She has complaints of shortness of breath; she is not very active but given her history, I recommended a chest x-ray today. She will be advised if there is any problem on her CXR. She has a history of hysterectomy, she has questions regarding her menopausal status and I have advised her we can check her [place] and estradiol. I will call her with those labs when they are available. She needs a follow-up mammogram of the right breast in January we will make sure that scheduled. It is short-term interval imaging. Hopefully everything will turn out okay and she can go back to yearly imaging. We will advise her of the results of the mammogram as well. I will see her back again in one year.  She was advised to call us with any problems or concerns prior to established follow-up and  we would see her sooner.    All questions were answered. The patient knows to call the clinic with any problems, questions or concerns. We can certainly see the patient much sooner if necessary. Molli Hazard 08/04/2014

## 2014-07-19 NOTE — Progress Notes (Signed)
LABS FOR CEA,[id number],CBCD,CMP

## 2014-07-31 ENCOUNTER — Ambulatory Visit (HOSPITAL_COMMUNITY)
Admission: RE | Admit: 2014-07-31 | Discharge: 2014-07-31 | Disposition: A | Discharge status: Home / Self Care | Payer: Commercial Managed Care - HMO | Source: Ambulatory Visit | Attending: Oncology | Admitting: Oncology

## 2014-07-31 ENCOUNTER — Other Ambulatory Visit (HOSPITAL_COMMUNITY): Payer: Self-pay | Admitting: Oncology

## 2014-07-31 DIAGNOSIS — N6310 Unspecified lump in the right breast, unspecified quadrant: Secondary | ICD-10-CM

## 2014-07-31 DIAGNOSIS — Z09 Encounter for follow-up examination after completed treatment for conditions other than malignant neoplasm: Secondary | ICD-10-CM

## 2014-07-31 DIAGNOSIS — Z853 Personal history of malignant neoplasm of breast: Secondary | ICD-10-CM | POA: Diagnosis present

## 2014-08-03 ENCOUNTER — Ambulatory Visit (HOSPITAL_COMMUNITY)
Admission: RE | Admit: 2014-08-03 | Discharge: 2014-08-03 | Disposition: A | Discharge status: Home / Self Care | Payer: Medicare HMO | Source: Ambulatory Visit | Attending: Hematology & Oncology | Admitting: Hematology & Oncology

## 2014-08-03 DIAGNOSIS — R531 Weakness: Secondary | ICD-10-CM | POA: Diagnosis not present

## 2014-08-03 DIAGNOSIS — R262 Difficulty in walking, not elsewhere classified: Secondary | ICD-10-CM | POA: Diagnosis not present

## 2014-08-03 DIAGNOSIS — R5383 Other fatigue: Secondary | ICD-10-CM | POA: Diagnosis not present

## 2014-08-03 DIAGNOSIS — R29898 Other symptoms and signs involving the musculoskeletal system: Secondary | ICD-10-CM

## 2014-08-03 NOTE — Patient Instructions (Addendum)
ROM: Flexion (Standing)   Bring arms straight out in front and raise as high as possible without pain. Keep palms facing up. Repeat __10__ times per set. Do ___1_ sets per session. Do ____ sessions per day.  http://orth.exer.us/908   Copyright  VHI. All rights reserved.  ROM: Abduction (Standing)   Bring arm10__ times per set. Do __1__ sets per session. Do ___1_ sessions per day.  http://orth.exer.us/910   Copyright  VHI. All rights reserved.  Bridging   Slowly raise buttocks from floor, keeping stomach tight. Repeat _10___ times per set. Do __1__ sets per session. Do ___1_ sessions per day.  http://orth.exer.us/1096   Copyright  VHI. All rights reserved.  Self-Mobilization: Knee Flexion (Prone)   Bring left heel toward buttocks as close as possible. Hold __5__ seconds. Relax. Repeat _10___ times per set. Do _1___ sets per session. Do __2__ sessions per day.  http://orth.exer.us/596   Copyright  VHI. All rights reserved.  Self-Mobilization: Knee Flexion (Prone)   Bring left heel toward buttocks as close as possible. Hold ____ seconds. Relax. Repeat ____ times per set. Do ____ sets per session. Do ____ sessions per day.  http://orth.exer.us/596   Copyright  VHI. All rights reserved.  Straight Leg Raise (Prone)   Abdomen and head supported, keep left knee locked and raise leg at hip. Avoid arching low back. Repeat 10____ times per set. Do _1___ sets per session. Do _2___ sessions per day.  http://orth.exer.us/1112   Copyright  VHI. All rights reserved.  Straight Leg Raise   Tighten stomach and slowly raise locked right leg ___18_ inches from floor. Repeat _10___ times per set. Do _1___ sets per session. Do __2__ sessions per day.  http://orth.exer.us/1102   Copyright  VHI. All rights reserved.  Straight Leg Raise   Tighten stomach and slowly raise locked right leg ____ inches from floor. Repeat ____ times per set. Do ____ sets per session. Do  ____ sessions per day.  http://orth.exer.us/1102   Copyright  VHI. All rights reserved.  Strengthening: Hip Abduction (Side-Lying)   Tighten muscles on front of left thigh, then lift leg __10__ inches from surface, keeping knee locked.  Repeat __10__ times per set. Do __1__ sets per session. Do _2___ sessions per day.  http://orth.exer.us/622   Copyright  VHI. All rights reserved.

## 2014-08-03 NOTE — Therapy (Addendum)
[facility] [place], [place], [address] Phone: [phone]   Fax:  [phone]  Physical Therapy Evaluation  Patient Details  Name: Gina Solis MRN: [id number] Date of Birth: January 16, 1961 Referring Provider:  Vallery Ridge*  Encounter Date: 08/03/2014      PT End of Session - 08/03/14 1627    Visit Number 1   Date for PT Re-Evaluation 10-20-2014   Authorization Type [employer]   PT Start Time 2831   PT Stop Time 1355   PT Time Calculation (min) 50 min      Past Medical History  Diagnosis Date  . Breast cancer 2008    a. diagnosed at stage IIIB  . Dermatomyositis   . Essential hypertension, benign   . Hypercholesteremia   . Arthritis   . GERD (gastroesophageal reflux disease)   . Lymphedema of arm     a. in left arm  . Multiple thyroid nodules   . 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 CAD (coronary artery disease)     a. s/p DES to LAD (09/29/13)  . History of echocardiogram     a. mod concentric and severe basal septal hypertrophy. EF 60-65%, no WMA, g1dd. Mild MR/TR    Past Surgical History  Procedure Laterality Date  . Abdominal hysterectomy    . Tubal ligation  1985  . Esophagogastroduodenoscopy  05/02/2007    DVV:OHYWV hiatal hernia, otherwise normal stomach./Normal duodenal bulb and second portion of the duodenum/White plaques seen from the proximal to the distal esophagus/ Shallow ulcerations with mild erythema seen scattered throughout the esophagus most pronounced in the distal esophagus.  HSV1  . Colonoscopy with esophagogastroduodenoscopy (egd)  07/15/2012    SLF:Two sessile polyps measuring 3-4 mm in size were found in the ascending colon and rectum; polypectomy was performed with cold forceps/ The colon mucosa was otherwise normal/Moderate sized internal hemorrhoids  . Breast reconstruction  left    X 2, trying again in spring.   . Mastectomy, partial  2008     Left-lumpectomy-axillary nodes x14  . Coronary angioplasty with stent placement  09/29/2013  . Left heart catheterization with coronary angiogram N/A 09/28/2013    Procedure: LEFT HEART CATHETERIZATION WITH CORONARY ANGIOGRAM;  Surgeon: Sinclair Grooms, MD;  Location: [facility] CATH LAB;  Service: Cardiovascular;  Laterality: N/A;    There were no vitals taken for this visit.  Visit Diagnosis:  Weakness of left leg  Other fatigue  Difficulty walking      Subjective Assessment - 08/03/14 1346    How long can you stand comfortably? Able to stand for five minutes           [facility] PT Assessment - 08/03/14 0001    Assessment   Medical Diagnosis weakness   Onset Date 04/26/14   Prior Therapy none   Precautions   Precautions None   Balance Screen   Has the patient fallen in the past 6 months No   Has the patient had a decrease in activity level because of a fear of falling?  Yes   Is the patient reluctant to leave their home because of a fear of falling?  Yes   Prior Function   Level of Independence Independent with basic ADLs   Observation/Other Assessments   Focus on Therapeutic Outcomes (FOTO)  --  FACIT-F 132.5; VAS fatige 2.3;pain 1.66; distress .66; Tug 11.50   Strength   Right Hip Flexion 3+/5  Right Hip Extension 4/5   Right Hip ABduction 5/5   Left Hip Flexion 2+/5   Left Hip Extension 2/5   Left Hip ABduction 2/5   Right Knee Flexion 5/5   Right Knee Extension 5/5   Left Knee Flexion --  4-/5   Left Knee Extension 5/5   Right Ankle Dorsiflexion 5/5   Left Ankle Dorsiflexion 5/5   Balance   Balance Assessed Yes  SLS LT unable R 3                  [facility] Adult PT Treatment/Exercise - 08/03/14 1333    Exercises   Exercises Knee/Hip   Knee/Hip Exercises: Supine   Bridges 10 reps   Straight Leg Raises Both;5 reps   Knee/Hip Exercises: Sidelying   Hip ABduction AAROM;Left;5 reps   Knee/Hip Exercises: Prone   Hamstring Curl 10 reps   Hamstring Curl  Limitations Lt 4 #   Hip Extension Both;10 reps                  PT Short Term Goals - 08/03/14 1632    PT SHORT TERM GOAL #1   Title I in HEP   Time 2   Period Weeks   PT SHORT TERM GOAL #2   Title Pt able to stand for 15 minutes to make a small meal   Time 4   Period Weeks   PT SHORT TERM GOAL #3   Title Pt to be walking 10 minutes a day for improved health habits   Time 2   Period Weeks           PT Long Term Goals - 08/03/14 1633    PT LONG TERM GOAL #1   Title Pt I in advance HEP   Time 4   Period Weeks   PT LONG TERM GOAL #2   Title Pt able to go shopping without having to sit down for an hour    Time 4   Period Weeks   PT LONG TERM GOAL #3   Title Pt to be able to stand for 30 minutes to make a meal    Time 4   Period Weeks   PT LONG TERM GOAL #4   Title Lt LE strength to be improved by one grade   Time 4   Period Weeks   PT LONG TERM GOAL #5   Title Pt to be able to SLS x 10 seconds B to reduce risk of falling               Plan - 08/03/14 1628    Clinical Impression Statement Pt is a 54 yo female who is s/p breast cancer as well as s/p stent placement.  The patient is concerned about her increasing fatigue level and has been referred to physical therapy for assessment.  Examinatiion demonstrates decreased strength and balance.  Ms. Shands will benefit from skilled PT to improve her functional capability and return her to her maximal functional aspect    Pt will benefit from skilled therapeutic intervention in order to improve on the following deficits Decreased activity tolerance;Decreased balance;Decreased strength   Rehab Potential Good   PT Frequency 2x / week   PT Duration 4 weeks   PT Treatment/Interventions ADLs/Self Care Home Management;Patient/family education;Gait training;Stair training;Therapeutic activities;Functional mobility training   PT Next Visit Plan begin t-band exercises, Leg press, SLS, wall push ups, Lt lateral and  forward step ups, heel raises and squats  PT Home Exercise Plan given for mat exercises          G-Codes - August 31, 2014 1637    Functional Assessment Tool Used clinical judgement   Functional Limitation Mobility: Walking and moving around   Mobility: Walking and Moving Around Current Status [phone]) At least 40 percent but less than 60 percent impaired, limited or restricted   Mobility: Walking and Moving Around Goal Status [phone]) At least 20 percent but less than 40 percent impaired, limited or restricted       Problem List Patient Active Problem List   Diagnosis Date Noted  . Acute sinusitis 04/09/2014  . OA (osteoarthritis) 04/09/2014  . Constipation 11/02/2013  . CAD (coronary artery disease)   . History of echocardiogram   . Other and unspecified angina pectoris 09/28/2013  . Abnormal myocardial perfusion study 09/27/2013  . SOB (shortness of breath) on exertion 04/24/2013  . Leg edema 04/24/2013  . Dermatomyositis 09/20/2012  . Alopecia 07/26/2012  . GERD (gastroesophageal reflux disease) 06/21/2012  . Hemorrhoids 11/20/2011  . Laryngopharyngeal reflux (LPR) 11/20/2011  . LYMPHEDEMA, LEFT ARM 09/04/2008  . THYROID NODULE 04/05/2007  . HYPERLIPIDEMIA 02/22/2007  . Morbid obesity 02/22/2007  . HYPERTENSION 02/22/2007  . GERD 02/22/2007  . BREAST CANCER, HX OF 02/22/2007    RUSSELL,CINDY PT 2014/08/31, 4:38 PM  [facility] [address] [place], [place], [address] Phone: [phone]   Fax:  [phone]

## 2014-08-04 NOTE — Assessment & Plan Note (Signed)
Pleasant 54 year old female with a history of stage IIIB carcinoma of the left breast. Disease was triple negative high-grade. She is now almost 8 years out from diagnosis she completed all recommended therapy. She has lymphedema of the left arm. He discussed referral to the program for additional management of her lymphedema. She has complaints of shortness of breath; she is not very active but given her history, I recommended a chest x-ray today. She will be advised if there is any problem on her CXR. She has a history of hysterectomy, she has questions regarding her menopausal status and I have advised her we can check her [place] and estradiol. I will call her with those labs when they are available. She needs a follow-up mammogram of the right breast in January we will make sure that scheduled. It is short-term interval imaging. Hopefully everything will turn out okay and she can go back to yearly imaging. We will advise her of the results of the mammogram as well. I will see her back again in one year.  She was advised to call us with any problems or concerns prior to established follow-up and we would see her sooner.

## 2014-08-07 ENCOUNTER — Ambulatory Visit (HOSPITAL_COMMUNITY)
Admission: RE | Admit: 2014-08-07 | Discharge: 2014-08-07 | Disposition: A | Discharge status: Home / Self Care | Payer: Medicare HMO | Source: Ambulatory Visit | Attending: Family Medicine | Admitting: Family Medicine

## 2014-08-07 DIAGNOSIS — R29898 Other symptoms and signs involving the musculoskeletal system: Secondary | ICD-10-CM

## 2014-08-07 DIAGNOSIS — R5383 Other fatigue: Secondary | ICD-10-CM

## 2014-08-07 DIAGNOSIS — R531 Weakness: Secondary | ICD-10-CM | POA: Diagnosis not present

## 2014-08-07 DIAGNOSIS — R262 Difficulty in walking, not elsewhere classified: Secondary | ICD-10-CM

## 2014-08-07 NOTE — Addendum Note (Signed)
Encounter addended by: Leeroy Cha, PT on: 08/07/2014  2:32 PM<BR>     Documentation filed: Clinical Notes

## 2014-08-07 NOTE — Patient Instructions (Signed)
Heel Raise: Bilateral (Standing)   Rise on balls of feet. Repeat ___15_ times per set. Do __1__ sets per session. Do ___1Functional Quadriceps: Chair Squat   Keeping feet flat on floor, shoulder width apart, squat as low as is comfortable. Use support as necessary. Repeat _15___ times per set. Do ___1_ sets per session. Do ___3Anterior Step-Up   Stand with _4-6__ inch step placed in A direction. Step onto step with right foot without pushing off with the ground foot. Finish with ground foot in touch balance on step and return __10-15_ times. ___ sets __1_ times per day.  http://gglj.exer.us/161   Copyright  VHI. All rights reserved.  _ sessions per day.  http://orth.exer.us/737   Copyright  VHI. All rights reserved.  _ sessions per day.  http://orth.exer.us/38   Copyright  VHI. All rights reserved.

## 2014-08-07 NOTE — Therapy (Signed)
[place], [place], [address] Phone: [phone]   Fax:  [phone]  Physical Therapy Treatment  Patient Details  Name: Gina Solis MRN: [id number] Date of Birth: [birth year] Referring Provider:  Alycia Rossetti, MD  Encounter Date: 08/07/2014      PT End of Session - 08/07/14 1426    Visit Number 2   Number of Visits 8   Date for PT Re-Evaluation 10-18-14   Authorization Type [employer] medic[employer]   PT Start Time 8786   PT Stop Time 1435   PT Time Calculation (min) 47 min      Past Medical History  Diagnosis Date  . Breast cancer 2008    a. diagnosed at stage IIIB  . Dermatomyositis   . Essential hypertension, benign   . Hypercholesteremia   . Arthritis   . GERD (gastroesophageal reflux disease)   . Lymphedema of arm     a. in left arm  . Multiple thyroid nodules   . 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 CAD (coronary artery disease)     a. s/p DES to LAD (09/29/13)  . History of echocardiogram     a. mod concentric and severe basal septal hypertrophy. EF 60-65%, no WMA, g1dd. Mild MR/TR    Past Surgical History  Procedure Laterality Date  . Abdominal hysterectomy    . Tubal ligation  1985  . Esophagogastroduodenoscopy  05/02/2007    VEH:MCNOB hiatal hernia, otherwise normal stomach./Normal duodenal bulb and second portion of the duodenum/White plaques seen from the proximal to the distal esophagus/ Shallow ulcerations with mild erythema seen scattered throughout the esophagus most pronounced in the distal esophagus.  HSV1  . Colonoscopy with esophagogastroduodenoscopy (egd)  07/15/2012    SLF:Two sessile polyps measuring 3-4 mm in size were found in the ascending colon and rectum; polypectomy was performed with cold forceps/ The colon mucosa was otherwise normal/Moderate sized internal hemorrhoids  . Breast reconstruction  left    X 2, trying again in spring.   .  Mastectomy, partial  2008    Left-lumpectomy-axillary nodes x14  . Coronary angioplasty with stent placement  09/29/2013  . Left heart catheterization with coronary angiogram N/A 09/28/2013    Procedure: LEFT HEART CATHETERIZATION WITH CORONARY ANGIOGRAM;  Surgeon: Sinclair Grooms, MD;  Location: [facility] CATH LAB;  Service: Cardiovascular;  Laterality: N/A;    There were no vitals taken for this visit.  Visit Diagnosis:  Weakness of left leg  Other fatigue  Difficulty walking      Subjective Assessment - 08/07/14 1407    Symptoms Pt states she has been wearing her pedometer               [facility] Adult PT Treatment/Exercise - 08/07/14 1413    Knee/Hip Exercises: Aerobic   Stationary Bike nustep hills 3; L 3 x 8:00    Knee/Hip Exercises: Machines for Strengthening   Total Gym Leg Press Pl 5 x 15   Knee/Hip Exercises: Standing   Heel Raises 10 reps   Forward Lunges Both;10 reps   Forward Step Up Both;10 reps   SLS x3 B    Other Standing Knee Exercises Wall push up x 5    Other Standing Knee Exercises t-band scapular retraction, row and shoulder ext x 10 (given t-band for HEP)            PT Short Term Goals - 08/03/14 0962  PT SHORT TERM GOAL #1   Title I in HEP   Time 2   Period Weeks   PT SHORT TERM GOAL #2   Title Pt able to stand for 15 minutes to make a small meal   Time 4   Period Weeks   PT SHORT TERM GOAL #3   Title Pt to be walking 10 minutes a day for improved health habits   Time 2   Period Weeks           PT Long Term Goals - 08/03/14 1633    PT LONG TERM GOAL #1   Title Pt I in advance HEP   Time 4   Period Weeks   PT LONG TERM GOAL #2   Title Pt able to go shopping without having to sit down for an hour    Time 4   Period Weeks   PT LONG TERM GOAL #3   Title Pt to be able to stand for 30 minutes to make a meal    Time 4   Period Weeks   PT LONG TERM GOAL #4   Title Lt LE strength to be improved by one grade   Time 4   Period Weeks    PT LONG TERM GOAL #5   Title Pt to be able to SLS x 10 seconds B to reduce risk of falling            Plan - 08/07/14 1426    Clinical Impression Statement Pt instructed in therapuetic exercises with theapist facilitation for proper technique.  Pt given HEP with t-band.     PT Next Visit Plan begin lateral step ups as well as side lunges next treatment.          Problem List Patient Active Problem List   Diagnosis Date Noted  . Acute sinusitis 04/09/2014  . OA (osteoarthritis) 04/09/2014  . Constipation 11/02/2013  . CAD (coronary artery disease)   . History of echocardiogram   . Other and unspecified angina pectoris 09/28/2013  . Abnormal myocardial perfusion study 09/27/2013  . SOB (shortness of breath) on exertion 04/24/2013  . Leg edema 04/24/2013  . Dermatomyositis 09/20/2012  . Alopecia 07/26/2012  . GERD (gastroesophageal reflux disease) 06/21/2012  . Hemorrhoids 11/20/2011  . Laryngopharyngeal reflux (LPR) 11/20/2011  . LYMPHEDEMA, LEFT ARM 09/04/2008  . THYROID NODULE 04/05/2007  . HYPERLIPIDEMIA 02/22/2007  . Morbid obesity 02/22/2007  . HYPERTENSION 02/22/2007  . GERD 02/22/2007  . BREAST CANCER, HX OF 02/22/2007    Dynesha Woolen,CINDY PT  08/07/2014, 2:29 PM  [place] [address] [place], [place], [address] Phone: [phone]   Fax:  [phone]

## 2014-08-09 ENCOUNTER — Ambulatory Visit (HOSPITAL_COMMUNITY): Payer: Medicare HMO | Admitting: Physical Therapy

## 2014-08-13 NOTE — Addendum Note (Signed)
Encounter addended by: Leeroy Cha, PT on: 08/13/2014 11:55 AM<BR>     Documentation filed: Letters

## 2014-08-15 ENCOUNTER — Ambulatory Visit (HOSPITAL_COMMUNITY)
Admission: RE | Admit: 2014-08-15 | Discharge: 2014-08-15 | Disposition: A | Discharge status: Home / Self Care | Payer: Medicare HMO | Source: Ambulatory Visit | Attending: Hematology & Oncology | Admitting: Hematology & Oncology

## 2014-08-15 DIAGNOSIS — R531 Weakness: Secondary | ICD-10-CM | POA: Diagnosis not present

## 2014-08-15 DIAGNOSIS — R262 Difficulty in walking, not elsewhere classified: Secondary | ICD-10-CM

## 2014-08-15 DIAGNOSIS — R5383 Other fatigue: Secondary | ICD-10-CM

## 2014-08-15 DIAGNOSIS — R29898 Other symptoms and signs involving the musculoskeletal system: Secondary | ICD-10-CM

## 2014-08-15 NOTE — Therapy (Signed)
[place], [place], [address] Phone: [phone]   Fax:  [phone]  Physical Therapy Treatment  Patient Details  Name: Gina Solis MRN: [id number] Date of Birth: 1961/06/14 Referring Provider:  Vallery Ridge*  Encounter Date: 08/15/2014      PT End of Session - 08/15/14 1011    Visit Number 3   Number of Visits 8   Date for PT Re-Evaluation 05-Oct-2014   Authorization Type human[employer] medicare   PT Start Time [phone]   PT Stop Time 1020   PT Time Calculation (min) 47 min   Activity Tolerance Patient tolerated treatment well   Behavior During Therapy [facility] for tasks assessed/performed      Past Medical History  Diagnosis Date  . Breast cancer 2008    a. diagnosed at stage IIIB  . Dermatomyositis   . Essential hypertension, benign   . Hypercholesteremia   . Arthritis   . GERD (gastroesophageal reflux disease)   . Lymphedema of arm     a. in left arm  . Multiple thyroid nodules   . 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 CAD (coronary artery disease)     a. s/p DES to LAD (09/29/13)  . History of echocardiogram     a. mod concentric and severe basal septal hypertrophy. EF 60-65%, no WMA, g1dd. Mild MR/TR    Past Surgical History  Procedure Laterality Date  . Abdominal hysterectomy    . Tubal ligation  1985  . Esophagogastroduodenoscopy  05/02/2007    INO:MVEHM hiatal hernia, otherwise normal stomach./Normal duodenal bulb and second portion of the duodenum/White plaques seen from the proximal to the distal esophagus/ Shallow ulcerations with mild erythema seen scattered throughout the esophagus most pronounced in the distal esophagus.  HSV1  . Colonoscopy with esophagogastroduodenoscopy (egd)  07/15/2012    SLF:Two sessile polyps measuring 3-4 mm in size were found in the ascending colon and rectum; polypectomy was performed with cold forceps/ The colon mucosa was otherwise  normal/Moderate sized internal hemorrhoids  . Breast reconstruction  left    X 2, trying again in spring.   . Mastectomy, partial  2008    Left-lumpectomy-axillary nodes x14  . Coronary angioplasty with stent placement  09/29/2013  . Left heart catheterization with coronary angiogram N/A 09/28/2013    Procedure: LEFT HEART CATHETERIZATION WITH CORONARY ANGIOGRAM;  Surgeon: Sinclair Grooms, MD;  Location: [facility] CATH LAB;  Service: Cardiovascular;  Laterality: N/A;    There were no vitals taken for this visit.  Visit Diagnosis:  Weakness of left leg  Other fatigue  Difficulty walking      Subjective Assessment - 08/15/14 1025    Symptoms No complaints of pain.    Currently in Pain? No/denies          [facility] PT Assessment - 08/15/14 0947    Assessment   Medical Diagnosis weakness   Onset Date 04/26/14                  [facility] Adult PT Treatment/Exercise - 08/15/14 0001    Exercises   Exercises Knee/Hip   Knee/Hip Exercises: Stretches   Active Hamstring Stretch 1 rep;30 seconds   Active Hamstring Stretch Limitations 14" Box   Quad Stretch 1 rep;30 seconds   Quad Stretch Limitations Prone with rope   Gastroc Stretch 1 rep;30 seconds   Gastroc Stretch Limitations Slantboard   Soleus Stretch 1 rep;30 seconds  Soleus Stretch Limitations Warehouse manager    Knee/Hip Exercises: Aerobic   Stationary Bike Nustep hills 3; L 3 x10:00    Knee/Hip Exercises: Standing   Heel Raises 15 reps   Forward Lunges Both;15 reps   Side Lunges Both;10 reps   Lateral Step Up Both;10 reps;Hand Hold: 0;Step Height: 6"   Forward Step Up Both;15 reps;Hand Hold: 0;Step Height: 6"   Other Standing Knee Exercises Wall push up x10                  PT Short Term Goals - 08/03/14 1632    PT SHORT TERM GOAL #1   Title I in HEP   Time 2   Period Weeks   PT SHORT TERM GOAL #2   Title Pt able to stand for 15 minutes to make a small meal   Time 4   Period Weeks   PT SHORT TERM GOAL  #3   Title Pt to be walking 10 minutes a day for improved health habits   Time 2   Period Weeks           PT Long Term Goals - 08/03/14 1633    PT LONG TERM GOAL #1   Title Pt I in advance HEP   Time 4   Period Weeks   PT LONG TERM GOAL #2   Title Pt able to go shopping without having to sit down for an hour    Time 4   Period Weeks   PT LONG TERM GOAL #3   Title Pt to be able to stand for 30 minutes to make a meal    Time 4   Period Weeks   PT LONG TERM GOAL #4   Title Lt LE strength to be improved by one grade   Time 4   Period Weeks   PT LONG TERM GOAL #5   Title Pt to be able to SLS x 10 seconds B to reduce risk of falling               Plan - 08/15/14 1013    Clinical Impression Statement Progressed exercise program, adding lateral step ups and lateral lunges to increase hip abduction strength and increased reps of exercises as tolerated.  Pt does report a hx of Lt ankle bone deterioration since age 23, and reports limited use of Lt LE during functional activities (stairs at home).  Educated pt on importance of strengtheing (B) LE, and not favoring Rt side to build strength and stability in lower body. No complaints of pain by end of treatment session, only muscle fatigue.      Pt will benefit from skilled therapeutic intervention in order to improve on the following deficits Decreased activity tolerance;Decreased balance;Decreased strength   Rehab Potential Good   PT Frequency 2x / week   PT Duration 4 weeks   PT Treatment/Interventions ADLs/Self Care Home Management;Patient/family education;Gait training;Stair training;Therapeutic activities;Functional mobility training   PT Next Visit Plan Continue strengthening program, adding balance activities as able.         Problem List Patient Active Problem List   Diagnosis Date Noted  . Acute sinusitis 04/09/2014  . OA (osteoarthritis) 04/09/2014  . Constipation 11/02/2013  . CAD (coronary artery disease)   .  History of echocardiogram   . Other and unspecified angina pectoris 09/28/2013  . Abnormal myocardial perfusion study 09/27/2013  . SOB (shortness of breath) on exertion 04/24/2013  . Leg edema 04/24/2013  . Dermatomyositis 09/20/2012  .  Alopecia 07/26/2012  . GERD (gastroesophageal reflux disease) 06/21/2012  . Hemorrhoids 11/20/2011  . Laryngopharyngeal reflux (LPR) 11/20/2011  . LYMPHEDEMA, LEFT ARM 09/04/2008  . THYROID NODULE 04/05/2007  . HYPERLIPIDEMIA 02/22/2007  . Morbid obesity 02/22/2007  . HYPERTENSION 02/22/2007  . GERD 02/22/2007  . BREAST CANCER, HX OF 02/22/2007    Lonna Cobb, DPT [phone]   08/15/2014, 10:26 AM  [place], [place], [address] Phone: [phone]   Fax:  [phone]

## 2014-08-17 ENCOUNTER — Ambulatory Visit (HOSPITAL_COMMUNITY): Payer: Medicare HMO | Admitting: Physical Therapy

## 2014-08-21 ENCOUNTER — Ambulatory Visit (HOSPITAL_COMMUNITY)
Admission: RE | Admit: 2014-08-21 | Discharge: 2014-08-21 | Disposition: A | Discharge status: Home / Self Care | Payer: Medicare HMO | Source: Ambulatory Visit | Attending: Hematology & Oncology | Admitting: Hematology & Oncology

## 2014-08-21 DIAGNOSIS — R5383 Other fatigue: Secondary | ICD-10-CM

## 2014-08-21 DIAGNOSIS — R531 Weakness: Secondary | ICD-10-CM | POA: Diagnosis not present

## 2014-08-21 DIAGNOSIS — R262 Difficulty in walking, not elsewhere classified: Secondary | ICD-10-CM

## 2014-08-21 DIAGNOSIS — R29898 Other symptoms and signs involving the musculoskeletal system: Secondary | ICD-10-CM

## 2014-08-21 NOTE — Therapy (Signed)
[place], [place], [address] Phone: [phone]   Fax:  [phone]  Physical Therapy Treatment  Patient Details  Name: Gina Solis MRN: [id number] Date of Birth: [birth year] Referring Provider:  Vallery Ridge*  Encounter Date: 08/21/2014      PT End of Session - 08/21/14 1344    Visit Number 4   Number of Visits 8   Date for PT Re-Evaluation 2014/10/26   Authorization Type [employer] medicare   PT Start Time 1305   PT Stop Time 1345   PT Time Calculation (min) 40 min   Activity Tolerance Patient tolerated treatment well   Behavior During Therapy [facility] for tasks assessed/performed      Past Medical History  Diagnosis Date  . Breast cancer 2008    a. diagnosed at stage IIIB  . Dermatomyositis   . Essential hypertension, benign   . Hypercholesteremia   . Arthritis   . GERD (gastroesophageal reflux disease)   . Lymphedema of arm     a. in left arm  . Multiple thyroid nodules   . 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 CAD (coronary artery disease)     a. s/p DES to LAD (09/29/13)  . History of echocardiogram     a. mod concentric and severe basal septal hypertrophy. EF 60-65%, no WMA, g1dd. Mild MR/TR    Past Surgical History  Procedure Laterality Date  . Abdominal hysterectomy    . Tubal ligation  1985  . Esophagogastroduodenoscopy  05/02/2007    ONG:EXBMW hiatal hernia, otherwise normal stomach./Normal duodenal bulb and second portion of the duodenum/White plaques seen from the proximal to the distal esophagus/ Shallow ulcerations with mild erythema seen scattered throughout the esophagus most pronounced in the distal esophagus.  HSV1  . Colonoscopy with esophagogastroduodenoscopy (egd)  07/15/2012    SLF:Two sessile polyps measuring 3-4 mm in size were found in the ascending colon and rectum; polypectomy was performed with cold forceps/ The colon mucosa was otherwise  normal/Moderate sized internal hemorrhoids  . Breast reconstruction  left    X 2, trying again in spring.   . Mastectomy, partial  2008    Left-lumpectomy-axillary nodes x14  . Coronary angioplasty with stent placement  09/29/2013  . Left heart catheterization with coronary angiogram N/A 09/28/2013    Procedure: LEFT HEART CATHETERIZATION WITH CORONARY ANGIOGRAM;  Surgeon: Sinclair Grooms, MD;  Location: [facility] CATH LAB;  Service: Cardiovascular;  Laterality: N/A;    There were no vitals taken for this visit.  Visit Diagnosis:  Weakness of left leg  Other fatigue  Difficulty walking      Subjective Assessment - 08/21/14 1309    Symptoms Pt reprots she has been trying to use her Lt LE more at home, when trying to go up and down the stairs.     Currently in Pain? No/denies          [facility] PT Assessment - 08/21/14 0001    Assessment   Medical Diagnosis weakness   Onset Date 04/26/14                  [facility] Adult PT Treatment/Exercise - 08/21/14 0001    Exercises   Exercises Knee/Hip   Knee/Hip Exercises: Stretches   Active Hamstring Stretch 1 rep;30 seconds   Active Hamstring Stretch Limitations 14" Box   Quad Stretch 1 rep;30 seconds   Quad Stretch Limitations Prone with rope  Gastroc Stretch 1 rep;30 seconds   Gastroc Stretch Limitations Slantboard   Soleus Stretch 1 rep;30 seconds   Soleus Stretch Limitations Warehouse manager    Knee/Hip Exercises: Aerobic   Stationary [place] 2, Lv 4, 10'   Knee/Hip Exercises: Standing   Heel Raises 2 sets;10 reps   Forward Lunges Both;15 reps   Side Lunges Both;15 reps   Functional Squat 10 reps   SLS with Vectors Tandem Rt 34.13", 22"  Lt 11", 9"   Other Standing Knee Exercises Wall push up x12   Other Standing Knee Exercises Monster Walk, RTB 30' RT   Knee/Hip Exercises: Prone   Hamstring Curl 2 sets;10 reps   Hamstring Curl Limitations Lt 4 #   Hip Extension 2 sets;10 reps   Other Prone Exercises [employer] in  quadraped  x5 each                  PT Short Term Goals - 08/03/14 1632    PT SHORT TERM GOAL #1   Title I in HEP   Time 2   Period Weeks   PT SHORT TERM GOAL #2   Title Pt able to stand for 15 minutes to make a small meal   Time 4   Period Weeks   PT SHORT TERM GOAL #3   Title Pt to be walking 10 minutes a day for improved health habits   Time 2   Period Weeks           PT Long Term Goals - 08/03/14 1633    PT LONG TERM GOAL #1   Title Pt I in advance HEP   Time 4   Period Weeks   PT LONG TERM GOAL #2   Title Pt able to go shopping without having to sit down for an hour    Time 4   Period Weeks   PT LONG TERM GOAL #3   Title Pt to be able to stand for 30 minutes to make a meal    Time 4   Period Weeks   PT LONG TERM GOAL #4   Title Lt LE strength to be improved by one grade   Time 4   Period Weeks   PT LONG TERM GOAL #5   Title Pt to be able to SLS x 10 seconds B to reduce risk of falling               Plan - 08/21/14 1344    Clinical Impression Statement No complaints of pain prior to treatment session, though pt does report some "weird" sensations in the Lt UE and questions nerve function returning on that side after procedures 7 years ago.  Pt does reprot by end of treatment session her legs felt "heavy" from the new exercises today, as muscle are still weak on the Lt > Rt side.   Pt will benefit from skilled therapeutic intervention in order to improve on the following deficits Decreased activity tolerance;Decreased balance;Decreased strength   Rehab Potential Good   PT Frequency 2x / week   PT Duration 4 weeks   PT Treatment/Interventions ADLs/Self Care Home Management;Patient/family education;Gait training;Stair training;Therapeutic activities;Functional mobility training   PT Next Visit Plan Continue balance activities, adding tandem gait on even or uneven terrain.         Problem List Patient Active Problem List   Diagnosis Date  Noted  . Acute sinusitis 04/09/2014  . OA (osteoarthritis) 04/09/2014  . Constipation 11/02/2013  . CAD (  coronary artery disease)   . History of echocardiogram   . Other and unspecified angina pectoris 09/28/2013  . Abnormal myocardial perfusion study 09/27/2013  . SOB (shortness of breath) on exertion 04/24/2013  . Leg edema 04/24/2013  . Dermatomyositis 09/20/2012  . Alopecia 07/26/2012  . GERD (gastroesophageal reflux disease) 06/21/2012  . Hemorrhoids 11/20/2011  . Laryngopharyngeal reflux (LPR) 11/20/2011  . LYMPHEDEMA, LEFT ARM 09/04/2008  . THYROID NODULE 04/05/2007  . HYPERLIPIDEMIA 02/22/2007  . Morbid obesity 02/22/2007  . HYPERTENSION 02/22/2007  . GERD 02/22/2007  . BREAST CANCER, HX OF 02/22/2007    Lonna Cobb, DPT [phone]  08/21/2014, 1:47 PM  [place] [address] [place], [place], [address] Phone: [phone]   Fax:  [phone]

## 2014-08-23 ENCOUNTER — Ambulatory Visit (HOSPITAL_COMMUNITY): Payer: Medicare HMO | Admitting: Physical Therapy

## 2014-08-28 ENCOUNTER — Ambulatory Visit (HOSPITAL_COMMUNITY)
Admission: RE | Admit: 2014-08-28 | Discharge: 2014-08-28 | Disposition: A | Discharge status: Home / Self Care | Payer: Commercial Managed Care - HMO | Source: Ambulatory Visit | Attending: Hematology & Oncology | Admitting: Hematology & Oncology

## 2014-08-28 DIAGNOSIS — R531 Weakness: Secondary | ICD-10-CM | POA: Diagnosis present

## 2014-08-28 DIAGNOSIS — R262 Difficulty in walking, not elsewhere classified: Secondary | ICD-10-CM | POA: Diagnosis not present

## 2014-08-28 DIAGNOSIS — R5383 Other fatigue: Secondary | ICD-10-CM

## 2014-08-28 DIAGNOSIS — R29898 Other symptoms and signs involving the musculoskeletal system: Secondary | ICD-10-CM

## 2014-08-28 NOTE — Therapy (Signed)
[place], [place], [address] Phone: [phone]   Fax:  [phone]  Physical Therapy Treatment  Patient Details  Name: Gina Solis MRN: [id number] Date of Birth: 19-Apr-1961 Referring Provider:  Vallery Ridge*  Encounter Date: 08/28/2014      PT End of Session - 08/28/14 1427    Visit Number 5   Number of Visits 8   Date for PT Re-Evaluation 2014/10/31   Authorization Type [employer]   PT Start Time 1348   PT Stop Time 1430   PT Time Calculation (min) 42 min   Equipment Utilized During Treatment Gait belt   Activity Tolerance Patient tolerated treatment well      Past Medical History  Diagnosis Date  . Breast cancer 2008    a. diagnosed at stage IIIB  . Dermatomyositis   . Essential hypertension, benign   . Hypercholesteremia   . Arthritis   . GERD (gastroesophageal reflux disease)   . Lymphedema of arm     a. in left arm  . Multiple thyroid nodules   . 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 CAD (coronary artery disease)     a. s/p DES to LAD (09/29/13)  . History of echocardiogram     a. mod concentric and severe basal septal hypertrophy. EF 60-65%, no WMA, g1dd. Mild MR/TR    Past Surgical History  Procedure Laterality Date  . Abdominal hysterectomy    . Tubal ligation  1985  . Esophagogastroduodenoscopy  05/02/2007    RFX:JOITG hiatal hernia, otherwise normal stomach./Normal duodenal bulb and second portion of the duodenum/White plaques seen from the proximal to the distal esophagus/ Shallow ulcerations with mild erythema seen scattered throughout the esophagus most pronounced in the distal esophagus.  HSV1  . Colonoscopy with esophagogastroduodenoscopy (egd)  07/15/2012    SLF:Two sessile polyps measuring 3-4 mm in size were found in the ascending colon and rectum; polypectomy was performed with cold forceps/ The colon mucosa was otherwise normal/Moderate  sized internal hemorrhoids  . Breast reconstruction  left    X 2, trying again in spring.   . Mastectomy, partial  2008    Left-lumpectomy-axillary nodes x14  . Coronary angioplasty with stent placement  09/29/2013  . Left heart catheterization with coronary angiogram N/A 09/28/2013    Procedure: LEFT HEART CATHETERIZATION WITH CORONARY ANGIOGRAM;  Surgeon: Sinclair Grooms, MD;  Location: [facility] CATH LAB;  Service: Cardiovascular;  Laterality: N/A;    There were no vitals taken for this visit.  Visit Diagnosis:  Weakness of left leg  Other fatigue  Difficulty walking      Subjective Assessment - 08/28/14 1408    Symptoms Pt states she sore but no pain.  Noticed increased atrophy in her calf    Currently in Pain? No/denies                    [facility] Adult PT Treatment/Exercise - 08/28/14 0001    Exercises   Exercises Knee/Hip   Knee/Hip Exercises: Stretches   Active Hamstring Stretch --   Active Hamstring Stretch Limitations --   Quad Stretch --   Quad Stretch Limitations --   Gastroc Stretch 1 rep;30 seconds   Gastroc Stretch Limitations Slantboard   Soleus Stretch 1 rep;30 seconds   Soleus Stretch Limitations Warehouse manager    Knee/Hip Exercises: Aerobic   Stationary [place] 2, Lv 4, 10'   Knee/Hip  Exercises: Standing   Heel Raises 2 sets;10 reps   Heel Raises Limitations 2# wt with squt    Forward Lunges Left;10 reps   Side Lunges Left;10 reps   Lateral Step Up Left;10 reps;Hand Hold: 0;Step Height: 6"   Forward Step Up Left;10 reps;Hand Hold: 0;Step Height: 6"   Functional Squat 10 reps   SLS with Vectors Tandem Rt 34.13", 22"  Lt 11", 9"   Other Standing Knee Exercises Wall push up x12   Other Standing Knee Exercises tandem gt on balance beam x w RT    Knee/Hip Exercises: Seated   Other Seated Knee Exercises sit to stand x 10   Knee/Hip Exercises: Prone   Hamstring Curl --   Hamstring Curl Limitations --   Hip Extension    Other Prone  Exercises                   PT Short Term Goals - 08/03/14 1632    PT SHORT TERM GOAL #1   Title I in HEP   Time 2   Period Weeks   PT SHORT TERM GOAL #2   Title Pt able to stand for 15 minutes to make a small meal   Time 4   Period Weeks   PT SHORT TERM GOAL #3   Title Pt to be walking 10 minutes a day for improved health habits   Time 2   Period Weeks           PT Long Term Goals - 08/03/14 1633    PT LONG TERM GOAL #1   Title Pt I in advance HEP   Time 4   Period Weeks   PT LONG TERM GOAL #2   Title Pt able to go shopping without having to sit down for an hour    Time 4   Period Weeks   PT LONG TERM GOAL #3   Title Pt to be able to stand for 30 minutes to make a meal    Time 4   Period Weeks   PT LONG TERM GOAL #4   Title Lt LE strength to be improved by one grade   Time 4   Period Weeks   PT LONG TERM GOAL #5   Title Pt to be able to SLS x 10 seconds B to reduce risk of falling               Plan - 08/28/14 1427    Clinical Impression Statement Pt needed to decrease to 4" step on lateral step ups to maintain good form.. Pt balance is impaired suggested working on SLS throughout the day at home.    PT Next Visit Plan begin t-band standing exercises for posture and improved core strength.         Problem List Patient Active Problem List   Diagnosis Date Noted  . Acute sinusitis 04/09/2014  . OA (osteoarthritis) 04/09/2014  . Constipation 11/02/2013  . CAD (coronary artery disease)   . History of echocardiogram   . Other and unspecified angina pectoris 09/28/2013  . Abnormal myocardial perfusion study 09/27/2013  . SOB (shortness of breath) on exertion 04/24/2013  . Leg edema 04/24/2013  . Dermatomyositis 09/20/2012  . Alopecia 07/26/2012  . GERD (gastroesophageal reflux disease) 06/21/2012  . Hemorrhoids 11/20/2011  . Laryngopharyngeal reflux (LPR) 11/20/2011  . LYMPHEDEMA, LEFT ARM 09/04/2008  . THYROID NODULE 04/05/2007  .  HYPERLIPIDEMIA 02/22/2007  . Morbid obesity 02/22/2007  . HYPERTENSION 02/22/2007  .  GERD 02/22/2007  . BREAST CANCER, HX OF 02/22/2007    RUSSELL,CINDY Pt 08/28/2014, 2:30 PM  [place] [address] [place], [place], [address] Phone: [phone]   Fax:  [phone]

## 2014-08-30 ENCOUNTER — Ambulatory Visit (HOSPITAL_COMMUNITY)
Admission: RE | Admit: 2014-08-30 | Discharge: 2014-08-30 | Disposition: A | Discharge status: Home / Self Care | Payer: Commercial Managed Care - HMO | Source: Ambulatory Visit | Attending: Hematology & Oncology | Admitting: Hematology & Oncology

## 2014-09-04 ENCOUNTER — Ambulatory Visit (HOSPITAL_COMMUNITY): Payer: Commercial Managed Care - HMO | Admitting: Physical Therapy

## 2014-09-06 ENCOUNTER — Ambulatory Visit (HOSPITAL_COMMUNITY): Payer: Commercial Managed Care - HMO | Admitting: Physical Therapy

## 2014-09-07 ENCOUNTER — Other Ambulatory Visit: Payer: Self-pay | Admitting: Family Medicine

## 2014-09-07 NOTE — Telephone Encounter (Signed)
Medication filled x1 with no refills.   Requires office visit before any further refills can be given.   Letter sent.  

## 2014-09-21 ENCOUNTER — Telehealth: Payer: Self-pay | Admitting: *Deleted

## 2014-09-21 ENCOUNTER — Encounter: Payer: Self-pay | Admitting: Cardiology

## 2014-09-21 ENCOUNTER — Ambulatory Visit (INDEPENDENT_AMBULATORY_CARE_PROVIDER_SITE_OTHER): Payer: Commercial Managed Care - HMO | Admitting: Cardiology

## 2014-09-21 VITALS — BP 138/68 | HR 84 | Wt 249.0 lb

## 2014-09-21 DIAGNOSIS — E782 Mixed hyperlipidemia: Secondary | ICD-10-CM

## 2014-09-21 DIAGNOSIS — R072 Precordial pain: Secondary | ICD-10-CM

## 2014-09-21 DIAGNOSIS — I25119 Atherosclerotic heart disease of native coronary artery with unspecified angina pectoris: Secondary | ICD-10-CM

## 2014-09-21 DIAGNOSIS — I10 Essential (primary) hypertension: Secondary | ICD-10-CM

## 2014-09-21 NOTE — Patient Instructions (Signed)
Your physician wants you to follow-up in: 6 months with Dr.McDowell You will receive a reminder letter in the mail two months in advance. If you don't receive a letter, please call our office to schedule the follow-up appointment.    Your physician recommends that you continue on your current medications as directed. Please refer to the Current Medication list given to you today.   Your physician has requested that you have a lexiscan myoview. For further information please visit [email or web address]. Please follow instruction sheet, as given.        Thank you for choosing [facility] !        

## 2014-09-21 NOTE — Progress Notes (Signed)
Cardiology Office Note  Date: 09/21/2014   ID: [name], [name] 02-10-1961, MRN [id number]  PCP: Vic Blackbird, MD  Primary Cardiologist: Rozann Lesches, MD   Chief Complaint  Patient presents with  . Coronary Artery Disease  . Hypertension    History of Present Illness: Gina Solis is a 54 y.o. female last seen by Ms. Lawrence NP in September 2015. She presents for a routine visit. In the last several weeks she states that she has been noticing a discomfort on the left side of her chest, not entirely typical for angina, but does have some features that are concerning. She states that she has had some left arm pain and stiffness, actually undergoing physical therapy for this, but sometimes does feel a mild tightness or stinging sensation on the left side of her chest when she is exerting herself with chores such as vacuuming or walking up steps. She reports compliance with her medications, has been on DAPT since last March.  Follow-up ECG today is reviewed below, no acute ST segment changes.  Today we discussed her symptoms, history of DES to the LAD in March of last year. Plan is to obtain follow-up ischemic testing.   Past Medical History  Diagnosis Date  . Breast cancer 2008    Diagnosed at stage IIIB  . Dermatomyositis   . Essential hypertension, benign   . Hypercholesteremia   . Arthritis   . GERD (gastroesophageal reflux disease)   . Lymphedema of arm     Left arm  . Multiple thyroid nodules   . 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 CAD (coronary artery disease)     DES to LAD 09/29/13, LVEF 65%    Past Surgical History  Procedure Laterality Date  . Abdominal hysterectomy    . Tubal ligation  1985  . Esophagogastroduodenoscopy  05/02/2007    KCL:EXNTZ hiatal hernia, otherwise normal stomach./Normal duodenal bulb and second portion of the duodenum/White plaques seen from the proximal to the distal esophagus/ Shallow ulcerations with  mild erythema seen scattered throughout the esophagus most pronounced in the distal esophagus.  HSV1  . Colonoscopy with esophagogastroduodenoscopy (egd)  07/15/2012    SLF:Two sessile polyps measuring 3-4 mm in size were found in the ascending colon and rectum; polypectomy was performed with cold forceps/ The colon mucosa was otherwise normal/Moderate sized internal hemorrhoids  . Breast reconstruction  left    X 2, trying again in spring.   . Mastectomy, partial  2008    Left-lumpectomy-axillary nodes x14  . Coronary angioplasty with stent placement  09/29/2013  . Left heart catheterization with coronary angiogram N/A 09/28/2013    Procedure: LEFT HEART CATHETERIZATION WITH CORONARY ANGIOGRAM;  Surgeon: Sinclair Grooms, MD;  Location: [facility] CATH LAB;  Service: Cardiovascular;  Laterality: N/A;    Current Outpatient Prescriptions  Medication Sig Dispense Refill  . acetaminophen (TYLENOL) 500 MG tablet Take 1,000 mg by mouth every 6 (six) hours as needed for mild pain.     Marland Kitchen aspirin 81 MG tablet Take 1 tablet (81 mg total) by mouth daily.    Marland Kitchen atorvastatin (LIPITOR) 80 MG tablet Take 1 tablet (80 mg total) by mouth daily at 6 PM. 90 tablet 1  . dexlansoprazole (DEXILANT) 60 MG capsule Take 1 capsule (60 mg total) by mouth daily. 30 capsule 6  . fluticasone (FLONASE) 50 MCG/ACT nasal spray Place 2 sprays into both nostrils daily. 16 g 3  . furosemide (LASIX)  20 MG tablet Take 1 tablet (20 mg total) by mouth as needed. 30 tablet 3  . loratadine (CLARITIN) 10 MG tablet Take 1 tablet (10 mg total) by mouth daily. 30 tablet 6  . lubiprostone (AMITIZA) 24 MCG capsule Take 1 capsule (24 mcg total) by mouth 2 (two) times daily as needed for constipation. 60 capsule 11  . metoprolol tartrate (LOPRESSOR) 25 MG tablet Take 1 tablet (25 mg total) by mouth 2 (two) times daily. 90 tablet 3  . nitroGLYCERIN (NITROSTAT) 0.4 MG SL tablet Place 0.4 mg under the tongue every 5 (five) minutes as needed for chest pain.     Marland Kitchen omeprazole (PRILOSEC) 20 MG capsule TAKE 1 CAPSULE BY MOUTH TWICE DAILY WITH MEAL 60 capsule 0  . Ticagrelor (BRILINTA) 90 MG TABS tablet Take 1 tablet (90 mg total) by mouth 2 (two) times daily. 60 tablet 11   No current facility-administered medications for this visit.    Allergies:  Codeine   Social History: The patient  reports that she quit smoking about 12 years ago. Her smoking use included Cigarettes. She started smoking about 19 years ago. She has a 4 pack-year smoking history. She has never used smokeless tobacco. She reports that she does not drink alcohol or use illicit drugs.   ROS:  Please see the history of present illness. Otherwise, complete review of systems is positive for none.  All other systems are reviewed and negative.    Physical Exam: VS:  BP 138/68 mmHg  Pulse 84  Wt 249 lb (112.946 kg)  SpO2 96%, BMI Body mass index is 44.12 kg/(m^2).  Wt Readings from Last 3 Encounters:  09/21/14 249 lb (112.946 kg)  07/19/14 252 lb (114.306 kg)  04/12/14 253 lb (114.76 kg)     Obese woman, appears comfortable at rest.  HEENT: Conjunctiva and lids normal, oropharynx clear.  Neck: Supple, no elevated JVP or carotid bruits, no thyromegaly.  Lungs: Clear to auscultation, nonlabored breathing at rest.  Cardiac: Regular rate and rhythm, no S3 or significant systolic murmur, no pericardial rub.  Abdomen: Soft, nontender, bowel sounds present.  Extremities: Trace edema, distal pulses 2+.  Skin: Warm and dry.  Musculoskeletal: No kyphosis.  Neuropsychiatric: Alert and oriented x3, affect grossly appropriate.   ECG: ECG is ordered today and reviewed showing normal sinus rhythm.  Recent Labwork: 07/19/2014: ALT 19; AST 18; BUN 16; Creatinine 1.01; Hemoglobin 12.2; Platelets 278; Potassium 3.8; Sodium 140     Component Value Date/Time   CHOL 126 04/09/2014 1232   TRIG 110 04/09/2014 1232   HDL 43 04/09/2014 1232   CHOLHDL 2.9 04/09/2014 1232   VLDL 22  04/09/2014 1232   LDLCALC 61 04/09/2014 1232    Other Studies Reviewed Today:  Echocardiogram 09/22/2013: Study Conclusions  - Left ventricle: Moderate concentric and severe basal septal hypertrophy. The cavity size was normal. Systolic function was normal. The estimated ejection fraction was in the range of 60% to 65%. Wall motion was normal; there were no regional wall motion abnormalities. There was an increased relative contribution of atrial contraction to ventricular filling. Doppler parameters are consistent with abnormal left ventricular relaxation (grade 1 diastolic dysfunction). Doppler parameters are consistent with high ventricular filling pressure. - Mitral valve: Mild regurgitation. - Tricuspid valve: Mild regurgitation.  ASSESSMENT AND PLAN:  1. CAD status post DES to the LAD in early March 2015. She is describing left-sided precordial and left arm symptoms, typical and atypical features for angina. Plan is to proceed with  a follow-up ischemic evaluation via [place] on medical therapy. If low risk, we will continue observation, plan on stopping Brilinta in the first week of March to complete her one-year course.  2. Essential hypertension, no change to current regimen.  3. Hyperlipidemia, continues on Lipitor with good LDL control.  Current medicines are reviewed at length with the patient today.  The patient does not have concerns regarding medicines.   Orders Placed This Encounter  Procedures  . NM Myocar Multi W/Spect W/Wall Motion / EF  . Myocardial Perfusion Imaging  . EKG 12-Lead    Disposition: FU with me in 6 months.   Signed, Satira Sark, MD, [facility] 09/21/2014 9:12 AM    [facility] at [facility] [address], [place], [place] [address] Phone: [phone]; Fax: [phone]

## 2014-09-21 NOTE — Telephone Encounter (Signed)
Received fax from [facility] silverback care mgmt with authorization number [id number] to [facility]  Requesting provider: Rozann Lesches, MD  Treating provider: [facility]  Place of service: [facility]  PCP: Vic Blackbird, MD  Type of service: Nuclear Med  Procedure: [id number]- Ht Muscle Image Spect Mult  Number of visits: 1  Start Date- 09/21/14- end date 03/20/15  Dx: R07.2-Precordial pian       I25.119- athscl heart disease of native cor art w unsp ang pctrs

## 2014-09-24 ENCOUNTER — Encounter (HOSPITAL_COMMUNITY)
Admission: RE | Admit: 2014-09-24 | Discharge: 2014-09-24 | Disposition: A | Discharge status: Home / Self Care | Payer: Commercial Managed Care - HMO | Source: Ambulatory Visit | Attending: Cardiology | Admitting: Cardiology

## 2014-09-24 ENCOUNTER — Ambulatory Visit (HOSPITAL_COMMUNITY)
Admission: RE | Admit: 2014-09-24 | Discharge: 2014-09-24 | Disposition: A | Discharge status: Home / Self Care | Payer: Commercial Managed Care - HMO | Source: Ambulatory Visit | Attending: Cardiovascular Disease | Admitting: Cardiovascular Disease

## 2014-09-24 ENCOUNTER — Encounter (HOSPITAL_COMMUNITY): Payer: Self-pay

## 2014-09-24 DIAGNOSIS — I2510 Atherosclerotic heart disease of native coronary artery without angina pectoris: Secondary | ICD-10-CM | POA: Insufficient documentation

## 2014-09-24 DIAGNOSIS — R072 Precordial pain: Secondary | ICD-10-CM | POA: Diagnosis not present

## 2014-09-24 DIAGNOSIS — R079 Chest pain, unspecified: Secondary | ICD-10-CM | POA: Diagnosis not present

## 2014-09-24 MED ORDER — REGADENOSON 0.4 MG/5ML IV SOLN
0.4000 mg | Freq: Once | INTRAVENOUS | Status: AC | PRN
  Administered 2014-09-24: 0.4 mg via INTRAVENOUS

## 2014-09-24 MED ORDER — SODIUM CHLORIDE 0.9 % IJ SOLN
10.0000 mL | INTRAMUSCULAR | Status: DC | PRN
  Administered 2014-09-24: 10 mL via INTRAVENOUS
  Filled 2014-09-24: qty 10

## 2014-09-24 MED ORDER — TECHNETIUM TC 99M SESTAMIBI - CARDIOLITE
10.0000 | Freq: Once | INTRAVENOUS | Status: AC | PRN
Start: 2014-09-24 — End: 2014-09-24
  Administered 2014-09-24: 10 via INTRAVENOUS

## 2014-09-24 MED ORDER — REGADENOSON 0.4 MG/5ML IV SOLN
INTRAVENOUS | Status: AC
Start: 2014-09-24 — End: 2014-09-24
  Administered 2014-09-24: 0.4 mg via INTRAVENOUS
  Filled 2014-09-24: qty 5

## 2014-09-24 MED ORDER — SODIUM CHLORIDE 0.9 % IJ SOLN
INTRAMUSCULAR | Status: AC
  Administered 2014-09-24: 10 mL via INTRAVENOUS
  Filled 2014-09-24: qty 3

## 2014-09-24 MED ORDER — TECHNETIUM TC 99M SESTAMIBI GENERIC - CARDIOLITE
30.0000 | Freq: Once | INTRAVENOUS | Status: AC | PRN
  Administered 2014-09-24: 30 via INTRAVENOUS

## 2014-09-24 NOTE — Progress Notes (Signed)
Stress Lab Nurses Notes - [place] 09/24/2014 Reason for doing test: CAD with precordial pain and left arm discomfort Type of test: Wille Glaser Nurse performing test: Gerrit Halls, RN Nuclear Medicine Tech: Redmond Baseman Echo Tech: Not Applicable MD performing test: Koneswaran/K.Purcell Nails NP Family MD: [facility] explained and consent signed: Yes.   IV started: Saline lock flushed, No redness or edema and Saline lock started in radiology Symptoms: dizziness & discomfort in stomache Treatment/Intervention: None Reason test stopped: protocol completed After recovery IV was: Discontinued via X-ray tech and No redness or edema Patient to return to Nuc. Med at : 12:30 Patient discharged: Home Patient's Condition upon discharge was: stable Comments: During test BP 153/82 & HR 110.  Recovery BP 152/79 & HR 83 .  Symptoms resolved in recovery. [name]

## 2014-09-25 ENCOUNTER — Ambulatory Visit: Payer: Medicare Other | Admitting: Family Medicine

## 2014-09-26 ENCOUNTER — Encounter: Payer: Self-pay | Admitting: Physician Assistant

## 2014-09-26 ENCOUNTER — Encounter: Payer: Self-pay | Admitting: *Deleted

## 2014-09-26 ENCOUNTER — Ambulatory Visit (INDEPENDENT_AMBULATORY_CARE_PROVIDER_SITE_OTHER): Payer: Commercial Managed Care - HMO | Admitting: Physician Assistant

## 2014-09-26 ENCOUNTER — Other Ambulatory Visit: Payer: Self-pay | Admitting: Physician Assistant

## 2014-09-26 VITALS — BP 150/90 | HR 72 | Ht 63.0 in | Wt 249.6 lb

## 2014-09-26 DIAGNOSIS — I10 Essential (primary) hypertension: Secondary | ICD-10-CM

## 2014-09-26 DIAGNOSIS — I25119 Atherosclerotic heart disease of native coronary artery with unspecified angina pectoris: Secondary | ICD-10-CM

## 2014-09-26 DIAGNOSIS — R079 Chest pain, unspecified: Secondary | ICD-10-CM

## 2014-09-26 NOTE — Assessment & Plan Note (Signed)
Blood pressure is up today. Patient is quite anxious. She says her blood pressure has been normal at other doctor's visits. Recommend 2 g sodium diet and following closely. She  may need another agent.

## 2014-09-26 NOTE — Assessment & Plan Note (Addendum)
Patient has typical and atypical chest pain. Recent stress Myoview reveals mild to moderate anteroseptal wall ischemia with anteroseptal hypokinesis EF 63%. This is felt to be an intermediate risk stress test finding. Dr. Domenic Polite recommends cardiac catheterization. It's been 1 year since she had the drug-eluting stent to the LAD. Risk and benefits discussed with patient who is agreeable to proceed. Hold Brilinta 48 hours prior to cath.

## 2014-09-26 NOTE — Patient Instructions (Signed)
Your physician has requested that you have a cardiac catheterization. Cardiac catheterization is used to diagnose and/or treat various heart conditions. Doctors may recommend this procedure for a number of different reasons. The most common reason is to evaluate chest pain. Chest pain can be a symptom of coronary artery disease (CAD), and cardiac catheterization can show whether plaque is narrowing or blocking your heart's arteries. This procedure is also used to evaluate the valves, as well as measure the blood flow and oxygen levels in different parts of your heart. For further information please visit [email or web address]. Please follow instruction sheet, as given.  Your physician recommends that you continue on your current medications as directed. Please refer to the Current Medication list given to you today.   Do not take Brilinta on Sat. Or Nancy Fetter before your Catheterization  Thank you for choosing [facility]!

## 2014-09-26 NOTE — Progress Notes (Signed)
Cardiology Office Note   Date:  09/26/2014   ID:  Gina, Solis 11/03/1960, MRN [id number]  PCP:  Vic Blackbird, MD  Cardiologist:  Dr. Domenic Polite  Chief Complaint: Chest pain    History of Present Illness: Gina Solis is a 54 y.o. female who presents for follow-up on chest pain and abnormal stress Myoview. Gina Solis has a history of CAD status post drug-eluting stent to the LAD in March 2015. Gina Solis has been maintained on Brilinta, aspirin, Lipitor and Lopressor. Gina Solis recently saw Dr. Domenic Polite complaining of some typical and atypical chest pain. He ordered a stress Myoview which showed mild to moderate anterior septal wall ischemia, anterior septal hypokinesis, EF 63%. He recommends Gina Solis proceed with cardiac catheterization.  The patient denies any further chest tightness or heaviness. The one episode Gina Solis had was when Gina Solis was lifting a heavy TV by herself. Gina Solis has chronic soreness and tenderness in her left chest area a cousin of all the reconstructive surgery Gina Solis's had from her breast cancer. Her blood pressure is up today but Gina Solis says it's been normal at all her other doctor's appointments. Gina Solis is quite nervous.  Past Medical History  Diagnosis Date  . Dermatomyositis   . Essential hypertension, benign   . Hypercholesteremia   . Arthritis   . GERD (gastroesophageal reflux disease)   . Lymphedema of arm     Left arm  . Multiple thyroid nodules   . 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 CAD (coronary artery disease)     DES to LAD 09/29/13, LVEF 65%  . Breast cancer 2008    Diagnosed at stage IIIB - Left    Past Surgical History  Procedure Laterality Date  . Abdominal hysterectomy    . Tubal ligation  1985  . Esophagogastroduodenoscopy  05/02/2007    [facility]:WCBJS hiatal hernia, otherwise normal stomach./Normal duodenal bulb and second portion of the duodenum/White plaques seen from the proximal to the distal esophagus/ Shallow ulcerations with mild erythema seen  scattered throughout the esophagus most pronounced in the distal esophagus.  HSV1  . Colonoscopy with esophagogastroduodenoscopy (egd)  07/15/2012    SLF:Two sessile polyps measuring 3-4 mm in size were found in the ascending colon and rectum; polypectomy was performed with cold forceps/ The colon mucosa was otherwise normal/Moderate sized internal hemorrhoids  . Breast reconstruction  left    X 2, trying again in spring.   . Mastectomy, partial  2008    Left-lumpectomy-axillary nodes x14  . Coronary angioplasty with stent placement  09/29/2013  . Left heart catheterization with coronary angiogram N/A 09/28/2013    Procedure: LEFT HEART CATHETERIZATION WITH CORONARY ANGIOGRAM;  Surgeon: Sinclair Grooms, MD;  Location: [facility] CATH LAB;  Service: Cardiovascular;  Laterality: N/A;     Current Outpatient Prescriptions  Medication Sig Dispense Refill  . acetaminophen (TYLENOL) 500 MG tablet Take 1,000 mg by mouth every 6 (six) hours as needed for mild pain.     Marland Kitchen atorvastatin (LIPITOR) 80 MG tablet Take 1 tablet (80 mg total) by mouth daily at 6 PM. 90 tablet 1  . dexlansoprazole (DEXILANT) 60 MG capsule Take 1 capsule (60 mg total) by mouth daily. 30 capsule 6  . loratadine (CLARITIN) 10 MG tablet Take 1 tablet (10 mg total) by mouth daily. 30 tablet 6  . lubiprostone (AMITIZA) 24 MCG capsule Take 1 capsule (24 mcg total) by mouth 2 (two) times daily as needed for constipation. 60 capsule 11  .  metoprolol tartrate (LOPRESSOR) 25 MG tablet Take 1 tablet (25 mg total) by mouth 2 (two) times daily. 90 tablet 3  . nitroGLYCERIN (NITROSTAT) 0.4 MG SL tablet Place 0.4 mg under the tongue every 5 (five) minutes as needed for chest pain.    . Ticagrelor (BRILINTA) 90 MG TABS tablet Take 1 tablet (90 mg total) by mouth 2 (two) times daily. 60 tablet 11   No current facility-administered medications for this visit.    Allergies:   Codeine    Social History:  The patient  reports that Gina Solis quit smoking about  12 years ago. Her smoking use included Cigarettes. Gina Solis started smoking about 19 years ago. Gina Solis has a 4 pack-year smoking history. Gina Solis has never used smokeless tobacco. Gina Solis reports that Gina Solis does not drink alcohol or use illicit drugs.   Family History:  The patient's family history includes Arthritis in her sister; Breast cancer in her paternal aunt; Cancer in her maternal grandmother; Colon cancer in her maternal uncle and paternal grandfather; Dementia in her paternal uncle; Diabetes in her brother and maternal grandmother; Heart attack in her paternal uncle; Heart disease in her maternal grandfather; Heart failure in her mother; Heart murmur in her sister; Hyperlipidemia in her brother and father; Hypertension in her brother and sister; Kidney failure in her mother; Sickle cell trait in her daughter.    ROS:  Please see the history of present illness.   Otherwise, review of systems are positive for chronic tenderness in her left breast. Gina Solis has had 3 reconstructive surgeries from breast cancer and is currently undergoing physical therapy for left arm and shoulder area..   All other systems are reviewed and negative.    PHYSICAL EXAM: VS:  BP 150/90 mmHg  Pulse 72  Ht 5\' 3"  (1.6 m)  Wt 249 lb 9.6 oz (113.218 kg)  BMI 44.23 kg/m2  SpO2 98% , BMI Body mass index is 44.23 kg/(m^2). GEN: Obese, well developed, in no acute distress HEENT: normal Neck: no JVD, HJR, carotid bruits, or masses Cardiac: RRR; no gallop ,murmurs, rubs, thrill or heave,no edema,   Respiratory:  clear to auscultation bilaterally, normal work of breathing GI: soft, nontender, nondistended, + BS MS: no deformity or atrophy Extremities: without cyanosis, clubbing, edema, good distal pulses bilaterally.  Skin: warm and dry, no rash Neuro:  Strength and sensation are intact Psych: euthymic mood, full affect   EKG:  EKG is not ordered today.    Recent Labs: 07/19/2014: ALT 19; BUN 16; Creatinine 1.01; Hemoglobin 12.2;  Platelets 278; Potassium 3.8; Sodium 140    Lipid Panel    Component Value Date/Time   CHOL 126 04/09/2014 1232   TRIG 110 04/09/2014 1232   HDL 43 04/09/2014 1232   CHOLHDL 2.9 04/09/2014 1232   VLDL 22 04/09/2014 1232   LDLCALC 61 04/09/2014 1232      Wt Readings from Last 3 Encounters:  09/26/14 249 lb 9.6 oz (113.218 kg)  09/21/14 249 lb (112.946 kg)  07/19/14 252 lb (114.306 kg)      Other studies Reviewed: Additional studies/ records that were reviewed today include and review of the records demonstrates:  Other Studies Reviewed Today:  Echocardiogram 09/22/2013: Study Conclusions  - Left ventricle: Moderate concentric and severe basal   septal hypertrophy. The cavity size was normal. Systolic   function was normal. The estimated ejection fraction was   in the range of 60% to 65%. Wall motion was normal; there   were no regional wall motion  abnormalities. There was an   increased relative contribution of atrial contraction to   ventricular filling. Doppler parameters are consistent   with abnormal left ventricular relaxation (grade 1   diastolic dysfunction). Doppler parameters are consistent   with high ventricular filling pressure. - Mitral valve: Mild regurgitation. - Tricuspid valve: Mild regurgitation.  Stress Myoview IMPRESSION: 1.  Mild to moderate anteroseptal wall ischemia.   2. Anteroseptal hypokinesis.   3. Left ventricular ejection fraction 63%   4. Intermediate-risk stress test findings*.   *2012 Appropriate Use Criteria for Coronary Revascularization Focused Update: J Am Coll Cardiol. 3267;12(4):580-998. http://content.[email or web address]?articleid=[email or web address]     Electronically Signed   By: Kate Sable   On: 09/24/2014 14:32 IMPRESSIONS:  1. Successful ostial proximal LAD DES implantation with reduction in stenosis from 85% to 0% with TIMI grade 3 flow   RECOMMENDATION:  Aspirin and Brilinta (or other dual antiplatelet  therapy regimen)for greater than 12 months and possibly indefinitely.  Candidate for AM discharge if right radial and right femoral cath sites are unremarkable and the patient has had no ischemic complications.    Sinclair Grooms, MD 09/28/2013 2:48 PM  ANGIOGRAPHIC DATA:   The left main coronary artery is widely patent.  The left anterior descending artery is severely diseased starting at the origin from the left main and extending 15 mm into the proximal segment. The LAD is obstructed up to 85%.  The left circumflex artery is large and gives origin to 2 large obtuse marginal branches.no significant obstruction is noted..  The right coronary artery is dominant and free of any significant obstruction.Marland Kitchen   LEFT VENTRICULOGRAM:  Left ventricular angiogram was done in the 30 RAO projection and revealed LVEF 70% with symmetrical wall motion.   IMPRESSIONS:  1. Severe ostial LAD stenosis in a patient with prior left mastectomy and high-dose radiation.  2. Widely patent circumflex and right coronary  3. Normal left ventricular function   RECOMMENDATION:  Have recommended that the patient undergo percutaneous intervention with stenting of the ostial to proximal LAD.            ASSESSMENT AND PLAN:   Chest pain Patient has typical and atypical chest pain. Recent stress Myoview reveals mild to moderate anteroseptal wall ischemia with anteroseptal hypokinesis EF 63%. This is felt to be an intermediate risk stress test finding. Dr. Domenic Polite recommends cardiac catheterization. It's been 1 year since Gina Solis had the drug-eluting stent to the LAD. Risk and benefits discussed with patient who is agreeable to proceed. Hold Brilinta 48 hours prior to cath.   CAD (coronary artery disease), native coronary artery Patient has typical and atypical chest pain. Recent stress Myoview reveals mild to moderate anteroseptal wall ischemia with anteroseptal hypokinesis EF 63%. This is felt to be an  intermediate risk stress test finding. Dr. Domenic Polite recommends cardiac catheterization. It's been 1 year since Gina Solis had the drug-eluting stent to the LAD. Risk and benefits discussed with patient who is agreeable to proceed.   Essential hypertension Blood pressure is up today. Patient is quite anxious. Gina Solis says her blood pressure has been normal at other doctor's visits. Recommend 2 g sodium diet and following closely. Gina Solis  may need another agent.   HYPERLIPIDEMIA On Lipitor.   Morbid obesity Weight loss and exercise program recommended.     Signed, Ermalinda Barrios, PA-C  09/26/2014 1:43 PM    [facility] [address], [place], [place]  [address] Phone: [phone]; Fax: [phone]

## 2014-09-26 NOTE — Assessment & Plan Note (Signed)
Weight loss and exercise program recommended 

## 2014-09-26 NOTE — Assessment & Plan Note (Signed)
Patient has typical and atypical chest pain. Recent stress Myoview reveals mild to moderate anteroseptal wall ischemia with anteroseptal hypokinesis EF 63%. This is felt to be an intermediate risk stress test finding. Dr. Domenic Polite recommends cardiac catheterization. It's been 1 year since she had the drug-eluting stent to the LAD. Risk and benefits discussed with patient who is agreeable to proceed.

## 2014-09-26 NOTE — Assessment & Plan Note (Signed)
On Lipitor 

## 2014-09-27 LAB — CBC WITH DIFFERENTIAL/PLATELET
BASOS PCT: 0 % (ref 0–1)
Basophils Absolute: 0 10*3/uL (ref 0.0–0.1)
Eosinophils Absolute: 0.1 10*3/uL (ref 0.0–0.7)
Eosinophils Relative: 2 % (ref 0–5)
HEMATOCRIT: 37.7 % (ref 36.0–46.0)
HEMOGLOBIN: 12.1 g/dL (ref 12.0–15.0)
LYMPHS ABS: 2.5 10*3/uL (ref 0.7–4.0)
Lymphocytes Relative: 36 % (ref 12–46)
MCH: 27.6 pg (ref 26.0–34.0)
MCHC: 32.1 g/dL (ref 30.0–36.0)
MCV: 85.9 fL (ref 78.0–100.0)
MPV: 11.3 fL (ref 8.6–12.4)
Monocytes Absolute: 0.3 10*3/uL (ref 0.1–1.0)
Monocytes Relative: 5 % (ref 3–12)
NEUTROS ABS: 3.9 10*3/uL (ref 1.7–7.7)
NEUTROS PCT: 57 % (ref 43–77)
Platelets: 307 10*3/uL (ref 150–400)
RBC: 4.39 MIL/uL (ref 3.87–5.11)
RDW: 14.3 % (ref 11.5–15.5)
WBC: 6.9 10*3/uL (ref 4.0–10.5)

## 2014-09-27 LAB — PROTIME-INR
INR: 1.06 (ref <1.50)
PROTHROMBIN TIME: 13.8 s (ref 11.6–15.2)

## 2014-09-27 LAB — BASIC METABOLIC PANEL
BUN: 13 mg/dL (ref 6–23)
CHLORIDE: 103 meq/L (ref 96–112)
CO2: 31 mEq/L (ref 19–32)
Calcium: 9.6 mg/dL (ref 8.4–10.5)
Creat: 0.85 mg/dL (ref 0.50–1.10)
Glucose, Bld: 83 mg/dL (ref 70–99)
POTASSIUM: 4.1 meq/L (ref 3.5–5.3)
SODIUM: 139 meq/L (ref 135–145)

## 2014-10-01 ENCOUNTER — Encounter (HOSPITAL_COMMUNITY): Payer: Self-pay | Admitting: *Deleted

## 2014-10-01 ENCOUNTER — Ambulatory Visit (HOSPITAL_COMMUNITY): Payer: Commercial Managed Care - HMO | Admitting: Certified Registered Nurse Anesthetist

## 2014-10-01 ENCOUNTER — Other Ambulatory Visit: Payer: Self-pay | Admitting: *Deleted

## 2014-10-01 ENCOUNTER — Encounter (HOSPITAL_COMMUNITY): Admission: RE | Disposition: E | Payer: Self-pay | Source: Ambulatory Visit | Attending: Cardiology

## 2014-10-01 ENCOUNTER — Encounter (HOSPITAL_COMMUNITY): Admission: RE | Disposition: E | Payer: Commercial Managed Care - HMO | Source: Ambulatory Visit | Attending: Cardiology

## 2014-10-01 ENCOUNTER — Ambulatory Visit (HOSPITAL_COMMUNITY)
Admission: RE | Admit: 2014-10-01 | Discharge: 2014-10-26 | Disposition: E | Payer: Commercial Managed Care - HMO | Source: Ambulatory Visit | Attending: Cardiology | Admitting: Cardiology

## 2014-10-01 ENCOUNTER — Other Ambulatory Visit: Payer: Self-pay | Admitting: Cardiovascular Disease

## 2014-10-01 DIAGNOSIS — I2510 Atherosclerotic heart disease of native coronary artery without angina pectoris: Secondary | ICD-10-CM | POA: Diagnosis present

## 2014-10-01 DIAGNOSIS — R079 Chest pain, unspecified: Secondary | ICD-10-CM

## 2014-10-01 DIAGNOSIS — T82858A Stenosis of vascular prosthetic devices, implants and grafts, initial encounter: Secondary | ICD-10-CM | POA: Diagnosis not present

## 2014-10-01 DIAGNOSIS — E785 Hyperlipidemia, unspecified: Secondary | ICD-10-CM | POA: Diagnosis not present

## 2014-10-01 DIAGNOSIS — Z87891 Personal history of nicotine dependence: Secondary | ICD-10-CM | POA: Insufficient documentation

## 2014-10-01 DIAGNOSIS — K219 Gastro-esophageal reflux disease without esophagitis: Secondary | ICD-10-CM | POA: Diagnosis not present

## 2014-10-01 DIAGNOSIS — Z955 Presence of coronary angioplasty implant and graft: Secondary | ICD-10-CM | POA: Diagnosis not present

## 2014-10-01 DIAGNOSIS — Z6841 Body Mass Index (BMI) 40.0 and over, adult: Secondary | ICD-10-CM | POA: Diagnosis not present

## 2014-10-01 DIAGNOSIS — I200 Unstable angina: Secondary | ICD-10-CM | POA: Diagnosis present

## 2014-10-01 DIAGNOSIS — Y838 Other surgical procedures as the cause of abnormal reaction of the patient, or of later complication, without mention of misadventure at the time of the procedure: Secondary | ICD-10-CM | POA: Insufficient documentation

## 2014-10-01 DIAGNOSIS — Z853 Personal history of malignant neoplasm of breast: Secondary | ICD-10-CM | POA: Diagnosis not present

## 2014-10-01 DIAGNOSIS — Z9012 Acquired absence of left breast and nipple: Secondary | ICD-10-CM | POA: Diagnosis not present

## 2014-10-01 DIAGNOSIS — I10 Essential (primary) hypertension: Secondary | ICD-10-CM | POA: Diagnosis not present

## 2014-10-01 DIAGNOSIS — I25119 Atherosclerotic heart disease of native coronary artery with unspecified angina pectoris: Secondary | ICD-10-CM | POA: Diagnosis not present

## 2014-10-01 DIAGNOSIS — R9439 Abnormal result of other cardiovascular function study: Secondary | ICD-10-CM | POA: Diagnosis present

## 2014-10-01 DIAGNOSIS — I469 Cardiac arrest, cause unspecified: Secondary | ICD-10-CM

## 2014-10-01 HISTORY — PX: LEFT HEART CATHETERIZATION WITH CORONARY ANGIOGRAM: SHX5451

## 2014-10-01 LAB — POCT I-STAT EG7
ACID-BASE DEFICIT: 8 mmol/L — AB (ref 0.0–2.0)
Bicarbonate: 20.7 mEq/L (ref 20.0–24.0)
Calcium, Ion: 1.18 mmol/L (ref 1.12–1.23)
HCT: 37 % (ref 36.0–46.0)
Hemoglobin: 12.6 g/dL (ref 12.0–15.0)
O2 Saturation: 14 %
Potassium: 3.8 mmol/L (ref 3.5–5.1)
SODIUM: 141 mmol/L (ref 135–145)
TCO2: 22 mmol/L (ref 0–100)
pCO2, Ven: 57.9 mmHg — ABNORMAL HIGH (ref 45.0–50.0)
pH, Ven: 7.162 — CL (ref 7.250–7.300)
pO2, Ven: 16 mmHg — CL (ref 30.0–45.0)

## 2014-10-01 LAB — COMPREHENSIVE METABOLIC PANEL
ALBUMIN: 2.9 g/dL — AB (ref 3.5–5.2)
ALT: 27 U/L (ref 0–35)
AST: 56 U/L — AB (ref 0–37)
Alkaline Phosphatase: 74 U/L (ref 39–117)
Anion gap: 17 — ABNORMAL HIGH (ref 5–15)
BILIRUBIN TOTAL: 1.5 mg/dL — AB (ref 0.3–1.2)
BUN: 11 mg/dL (ref 6–23)
CO2: 17 mmol/L — AB (ref 19–32)
Calcium: 8.9 mg/dL (ref 8.4–10.5)
Chloride: 107 mmol/L (ref 96–112)
Creatinine, Ser: 1.33 mg/dL — ABNORMAL HIGH (ref 0.50–1.10)
GFR calc Af Amer: 52 mL/min — ABNORMAL LOW (ref >90)
GFR, EST NON AFRICAN AMERICAN: 45 mL/min — AB (ref >90)
Glucose, Bld: 147 mg/dL — ABNORMAL HIGH (ref 70–99)
POTASSIUM: 5.2 mmol/L — AB (ref 3.5–5.1)
Sodium: 141 mmol/L (ref 135–145)
Total Protein: 6.2 g/dL (ref 6.0–8.3)

## 2014-10-01 LAB — CBC
HCT: 37.1 % (ref 36.0–46.0)
Hemoglobin: 12.2 g/dL (ref 12.0–15.0)
MCH: 28.8 pg (ref 26.0–34.0)
MCHC: 32.9 g/dL (ref 30.0–36.0)
MCV: 87.5 fL (ref 78.0–100.0)
Platelets: 328 10*3/uL (ref 150–400)
RBC: 4.24 MIL/uL (ref 3.87–5.11)
RDW: 14.8 % (ref 11.5–15.5)
WBC: 14.8 10*3/uL — ABNORMAL HIGH (ref 4.0–10.5)

## 2014-10-01 SURGERY — LEFT HEART CATHETERIZATION WITH CORONARY ANGIOGRAM

## 2014-10-01 SURGERY — LEFT HEART CATHETERIZATION WITH CORONARY ANGIOGRAM
Anesthesia: LOCAL

## 2014-10-01 MED ORDER — FENTANYL CITRATE 0.05 MG/ML IJ SOLN
INTRAMUSCULAR | Status: AC
  Filled 2014-10-01: qty 2

## 2014-10-01 MED ORDER — ATROPINE SULFATE 0.1 MG/ML IJ SOLN
INTRAMUSCULAR | Status: AC
  Filled 2014-10-01: qty 10

## 2014-10-01 MED ORDER — ONDANSETRON HCL 4 MG/2ML IJ SOLN
4.0000 mg | Freq: Four times a day (QID) | INTRAMUSCULAR | Status: DC | PRN
  Administered 2014-10-01: 4 mg via INTRAVENOUS
  Filled 2014-10-01: qty 2

## 2014-10-01 MED ORDER — HEPARIN (PORCINE) IN NACL 2-0.9 UNIT/ML-% IJ SOLN
INTRAMUSCULAR | Status: AC
  Filled 2014-10-01: qty 1500

## 2014-10-01 MED ORDER — MORPHINE SULFATE 2 MG/ML IJ SOLN: 2.0000 mg | INTRAMUSCULAR | Status: DC | PRN

## 2014-10-01 MED ORDER — ASPIRIN 81 MG PO CHEW
81.0000 mg | CHEWABLE_TABLET | ORAL | Status: DC
Start: 2014-10-01 — End: 2014-10-01

## 2014-10-01 MED ORDER — HYDRALAZINE HCL 20 MG/ML IJ SOLN
10.0000 mg | Freq: Four times a day (QID) | INTRAMUSCULAR | Status: DC | PRN
  Administered 2014-10-01: 10 mg via INTRAVENOUS

## 2014-10-01 MED ORDER — MORPHINE SULFATE 2 MG/ML IJ SOLN
INTRAMUSCULAR | Status: AC
  Filled 2014-10-01: qty 1

## 2014-10-01 MED ORDER — SUCCINYLCHOLINE CHLORIDE 20 MG/ML IJ SOLN
INTRAMUSCULAR | Status: DC | PRN
  Administered 2014-10-01: 17:00:00 100 mg via INTRAVENOUS

## 2014-10-01 MED ORDER — SODIUM CHLORIDE 0.9 % IV SOLN
INTRAVENOUS | Status: DC
  Administered 2014-10-01: 10:00:00 via INTRAVENOUS

## 2014-10-01 MED ORDER — SODIUM BICARBONATE 8.4 % IV SOLN
INTRAVENOUS | Status: AC
  Filled 2014-10-01: qty 50

## 2014-10-01 MED ORDER — EPINEPHRINE HCL 0.1 MG/ML IJ SOSY
PREFILLED_SYRINGE | INTRAMUSCULAR | Status: AC
  Filled 2014-10-01: qty 20

## 2014-10-01 MED ORDER — HYDRALAZINE HCL 20 MG/ML IJ SOLN
INTRAMUSCULAR | Status: AC
  Filled 2014-10-01: qty 1

## 2014-10-01 MED ORDER — ASPIRIN EC 81 MG PO TBEC: 81.0000 mg | DELAYED_RELEASE_TABLET | Freq: Every day | ORAL | Status: DC

## 2014-10-01 MED ORDER — NITROGLYCERIN 0.4 MG SL SUBL: 0.4000 mg | SUBLINGUAL_TABLET | SUBLINGUAL | Status: DC | PRN

## 2014-10-01 MED ORDER — ATORVASTATIN CALCIUM 80 MG PO TABS
80.0000 mg | ORAL_TABLET | Freq: Every day | ORAL | Status: DC
  Filled 2014-10-01: qty 1

## 2014-10-01 MED ORDER — MIDAZOLAM HCL 2 MG/2ML IJ SOLN
INTRAMUSCULAR | Status: AC
  Filled 2014-10-01: qty 2

## 2014-10-01 MED ORDER — LUBIPROSTONE 24 MCG PO CAPS
24.0000 ug | ORAL_CAPSULE | Freq: Every day | ORAL | Status: DC
  Filled 2014-10-01: qty 1

## 2014-10-01 MED ORDER — METOPROLOL TARTRATE 25 MG PO TABS: 25.0000 mg | ORAL_TABLET | Freq: Two times a day (BID) | ORAL | Status: DC

## 2014-10-01 MED ORDER — SODIUM CHLORIDE 0.9 % IV SOLN: 1.0000 mL/kg/h | INTRAVENOUS | Status: AC

## 2014-10-01 MED ORDER — SODIUM CHLORIDE 0.9 % IV SOLN: 250.0000 mL | INTRAVENOUS | Status: DC | PRN

## 2014-10-01 MED ORDER — ETOMIDATE 2 MG/ML IV SOLN
INTRAVENOUS | Status: DC | PRN
  Administered 2014-10-01: 17:00:00 8 mg via INTRAVENOUS

## 2014-10-01 MED ORDER — PANTOPRAZOLE SODIUM 40 MG PO TBEC: 40.0000 mg | DELAYED_RELEASE_TABLET | Freq: Every day | ORAL | Status: DC

## 2014-10-01 MED ORDER — LIDOCAINE HCL (PF) 1 % IJ SOLN
INTRAMUSCULAR | Status: AC
  Filled 2014-10-01: qty 30

## 2014-10-01 MED ORDER — SODIUM CHLORIDE 0.9 % IJ SOLN: 3.0000 mL | INTRAMUSCULAR | Status: DC | PRN

## 2014-10-01 MED ORDER — SODIUM CHLORIDE 0.9 % IV SOLN
INTRAVENOUS | Status: DC | PRN
  Administered 2014-10-01: 17:00:00 via INTRAVENOUS

## 2014-10-01 MED ORDER — ACETAMINOPHEN 325 MG PO TABS: 650.0000 mg | ORAL_TABLET | ORAL | Status: DC | PRN

## 2014-10-01 MED ORDER — SODIUM CHLORIDE 0.9 % IJ SOLN: 3.0000 mL | Freq: Two times a day (BID) | INTRAMUSCULAR | Status: DC

## 2014-10-01 NOTE — Progress Notes (Signed)
During sheath removal HR declined to 37,  sbp TO 70'S. 0.6mg  atropine IV and a brief fluid bolus given with rapid recovery.

## 2014-10-01 NOTE — Progress Notes (Signed)
Patient ID: Gina Solis, female   DOB: 20-Feb-1961, 54 y.o.   MRN: [id number] Called to assist in patient who became unresponsive Post Diagnostic cath with instent restenosis of ostial LAD for surgical consult Patient had RFA sheath pulled  Sight looked fine with no hematoma Had only received Zofran  Became unresponsive.  ECG showed acute precordial ST elevation consistent with LAD occlusion Plans made to take to cath lab They were notified  Initially had stable HR and BP.  After about 15 minutes BP began to wane  Code called and CPR ensued for over 40 minutes once BP lost   She was intubated by Dr Tobias Alexander Anesthesia to protect airway.  Still with no responsiveness and Then had prolonged EMD No VT/VF no shockable rhythm.    Was given Amp D50 with no response.    Despite 5 rounds of epi, atropine and vasoprssin she was never found to have a pulse even using doppler in the carotid and femoral regions  After discussion with Dr Roxan Hockey and Dr Burt Knack who were both present during CPR code was called  Daughter was put in a room and given news She is calling family  Total critical care time 1 hour including assessment and CPR  Jenkins Rouge

## 2014-10-01 NOTE — H&P (View-Only) (Signed)
Cardiology Office Note   Date:  09/26/2014   ID:  Gina Solis, Gina Solis [birth year], MRN [id number]  PCP:  Vic Blackbird, MD  Cardiologist:  Dr. Domenic Polite  Chief Complaint: Chest pain    History of Present Illness: Gina Solis is a 54 y.o. female who presents for follow-up on chest pain and abnormal stress Myoview. She has a history of CAD status post drug-eluting stent to the LAD in March 2015. She has been maintained on Brilinta, aspirin, Lipitor and Lopressor. She recently saw Dr. Domenic Polite complaining of some typical and atypical chest pain. He ordered a stress Myoview which showed mild to moderate anterior septal wall ischemia, anterior septal hypokinesis, EF 63%. He recommends she proceed with cardiac catheterization.  The patient denies any further chest tightness or heaviness. The one episode she had was when she was lifting a heavy TV by herself. She has chronic soreness and tenderness in her left chest area a cousin of all the reconstructive surgery she's had from her breast cancer. Her blood pressure is up today but she says it's been normal at all her other doctor's appointments. She is quite nervous.  Past Medical History  Diagnosis Date  . Dermatomyositis   . Essential hypertension, benign   . Hypercholesteremia   . Arthritis   . GERD (gastroesophageal reflux disease)   . Lymphedema of arm     Left arm  . Multiple thyroid nodules   . 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 CAD (coronary artery disease)     DES to LAD 09/29/13, LVEF 65%  . Breast cancer 2008    Diagnosed at stage IIIB - Left    Past Surgical History  Procedure Laterality Date  . Abdominal hysterectomy    . Tubal ligation  1985  . Esophagogastroduodenoscopy  05/02/2007    [facility]:KDXIP hiatal hernia, otherwise normal stomach./Normal duodenal bulb and second portion of the duodenum/White plaques seen from the proximal to the distal esophagus/ Shallow ulcerations with mild erythema seen  scattered throughout the esophagus most pronounced in the distal esophagus.  HSV1  . Colonoscopy with esophagogastroduodenoscopy (egd)  07/15/2012    SLF:Two sessile polyps measuring 3-4 mm in size were found in the ascending colon and rectum; polypectomy was performed with cold forceps/ The colon mucosa was otherwise normal/Moderate sized internal hemorrhoids  . Breast reconstruction  left    X 2, trying again in spring.   . Mastectomy, partial  2008    Left-lumpectomy-axillary nodes x14  . Coronary angioplasty with stent placement  09/29/2013  . Left heart catheterization with coronary angiogram N/A 09/28/2013    Procedure: LEFT HEART CATHETERIZATION WITH CORONARY ANGIOGRAM;  Surgeon: Sinclair Grooms, MD;  Location: [facility] CATH LAB;  Service: Cardiovascular;  Laterality: N/A;     Current Outpatient Prescriptions  Medication Sig Dispense Refill  . acetaminophen (TYLENOL) 500 MG tablet Take 1,000 mg by mouth every 6 (six) hours as needed for mild pain.     Marland Kitchen atorvastatin (LIPITOR) 80 MG tablet Take 1 tablet (80 mg total) by mouth daily at 6 PM. 90 tablet 1  . dexlansoprazole (DEXILANT) 60 MG capsule Take 1 capsule (60 mg total) by mouth daily. 30 capsule 6  . loratadine (CLARITIN) 10 MG tablet Take 1 tablet (10 mg total) by mouth daily. 30 tablet 6  . lubiprostone (AMITIZA) 24 MCG capsule Take 1 capsule (24 mcg total) by mouth 2 (two) times daily as needed for constipation. 60 capsule 11  .  metoprolol tartrate (LOPRESSOR) 25 MG tablet Take 1 tablet (25 mg total) by mouth 2 (two) times daily. 90 tablet 3  . nitroGLYCERIN (NITROSTAT) 0.4 MG SL tablet Place 0.4 mg under the tongue every 5 (five) minutes as needed for chest pain.    . Ticagrelor (BRILINTA) 90 MG TABS tablet Take 1 tablet (90 mg total) by mouth 2 (two) times daily. 60 tablet 11   No current facility-administered medications for this visit.    Allergies:   Codeine    Social History:  The patient  reports that she quit smoking about  12 years ago. Her smoking use included Cigarettes. She started smoking about 19 years ago. She has a 4 pack-year smoking history. She has never used smokeless tobacco. She reports that she does not drink alcohol or use illicit drugs.   Family History:  The patient's family history includes Arthritis in her sister; Breast cancer in her paternal aunt; Cancer in her maternal grandmother; Colon cancer in her maternal uncle and paternal grandfather; Dementia in her paternal uncle; Diabetes in her brother and maternal grandmother; Heart attack in her paternal uncle; Heart disease in her maternal grandfather; Heart failure in her mother; Heart murmur in her sister; Hyperlipidemia in her brother and father; Hypertension in her brother and sister; Kidney failure in her mother; Sickle cell trait in her daughter.    ROS:  Please see the history of present illness.   Otherwise, review of systems are positive for chronic tenderness in her left breast. She has had 3 reconstructive surgeries from breast cancer and is currently undergoing physical therapy for left arm and shoulder area..   All other systems are reviewed and negative.    PHYSICAL EXAM: VS:  BP 150/90 mmHg  Pulse 72  Ht 5\' 3"  (1.6 m)  Wt 249 lb 9.6 oz (113.218 kg)  BMI 44.23 kg/m2  SpO2 98% , BMI Body mass index is 44.23 kg/(m^2). GEN: Obese, well developed, in no acute distress HEENT: normal Neck: no JVD, HJR, carotid bruits, or masses Cardiac: RRR; no gallop ,murmurs, rubs, thrill or heave,no edema,   Respiratory:  clear to auscultation bilaterally, normal work of breathing GI: soft, nontender, nondistended, + BS MS: no deformity or atrophy Extremities: without cyanosis, clubbing, edema, good distal pulses bilaterally.  Skin: warm and dry, no rash Neuro:  Strength and sensation are intact Psych: euthymic mood, full affect   EKG:  EKG is not ordered today.    Recent Labs: 07/19/2014: ALT 19; BUN 16; Creatinine 1.01; Hemoglobin 12.2;  Platelets 278; Potassium 3.8; Sodium 140    Lipid Panel    Component Value Date/Time   CHOL 126 04/09/2014 1232   TRIG 110 04/09/2014 1232   HDL 43 04/09/2014 1232   CHOLHDL 2.9 04/09/2014 1232   VLDL 22 04/09/2014 1232   LDLCALC 61 04/09/2014 1232      Wt Readings from Last 3 Encounters:  09/26/14 249 lb 9.6 oz (113.218 kg)  09/21/14 249 lb (112.946 kg)  07/19/14 252 lb (114.306 kg)      Other studies Reviewed: Additional studies/ records that were reviewed today include and review of the records demonstrates:  Other Studies Reviewed Today:  Echocardiogram 09/22/2013: Study Conclusions  - Left ventricle: Moderate concentric and severe basal   septal hypertrophy. The cavity size was normal. Systolic   function was normal. The estimated ejection fraction was   in the range of 60% to 65%. Wall motion was normal; there   were no regional wall motion  abnormalities. There was an   increased relative contribution of atrial contraction to   ventricular filling. Doppler parameters are consistent   with abnormal left ventricular relaxation (grade 1   diastolic dysfunction). Doppler parameters are consistent   with high ventricular filling pressure. - Mitral valve: Mild regurgitation. - Tricuspid valve: Mild regurgitation.  Stress Myoview IMPRESSION: 1.  Mild to moderate anteroseptal wall ischemia.   2. Anteroseptal hypokinesis.   3. Left ventricular ejection fraction 63%   4. Intermediate-risk stress test findings*.   *2012 Appropriate Use Criteria for Coronary Revascularization Focused Update: J Am Coll Cardiol. 9449;67(5):916-384. http://[email or web address]?[email or web address]     Electronically Signed   By: Kate Sable   On: 09/24/2014 14:32 IMPRESSIONS:  1. Successful ostial proximal LAD DES implantation with reduction in stenosis from 85% to 0% with TIMI grade 3 flow   RECOMMENDATION:  Aspirin and Brilinta (or other dual antiplatelet  therapy regimen)for greater than 12 months and possibly indefinitely.  Candidate for AM discharge if right radial and right femoral cath sites are unremarkable and the patient has had no ischemic complications.    Sinclair Grooms, MD 09/28/2013 2:48 PM  ANGIOGRAPHIC DATA:   The left main coronary artery is widely patent.  The left anterior descending artery is severely diseased starting at the origin from the left main and extending 15 mm into the proximal segment. The LAD is obstructed up to 85%.  The left circumflex artery is large and gives origin to 2 large obtuse marginal branches.no significant obstruction is noted..  The right coronary artery is dominant and free of any significant obstruction.Marland Kitchen   LEFT VENTRICULOGRAM:  Left ventricular angiogram was done in the 30 RAO projection and revealed LVEF 70% with symmetrical wall motion.   IMPRESSIONS:  1. Severe ostial LAD stenosis in a patient with prior left mastectomy and high-dose radiation.  2. Widely patent circumflex and right coronary  3. Normal left ventricular function   RECOMMENDATION:  Have recommended that the patient undergo percutaneous intervention with stenting of the ostial to proximal LAD.            ASSESSMENT AND PLAN:   Chest pain Patient has typical and atypical chest pain. Recent stress Myoview reveals mild to moderate anteroseptal wall ischemia with anteroseptal hypokinesis EF 63%. This is felt to be an intermediate risk stress test finding. Dr. Domenic Polite recommends cardiac catheterization. It's been 1 year since she had the drug-eluting stent to the LAD. Risk and benefits discussed with patient who is agreeable to proceed. Hold Brilinta 48 hours prior to cath.   CAD (coronary artery disease), native coronary artery Patient has typical and atypical chest pain. Recent stress Myoview reveals mild to moderate anteroseptal wall ischemia with anteroseptal hypokinesis EF 63%. This is felt to be an  intermediate risk stress test finding. Dr. Domenic Polite recommends cardiac catheterization. It's been 1 year since she had the drug-eluting stent to the LAD. Risk and benefits discussed with patient who is agreeable to proceed.   Essential hypertension Blood pressure is up today. Patient is quite anxious. She says her blood pressure has been normal at other doctor's visits. Recommend 2 g sodium diet and following closely. She  may need another agent.   HYPERLIPIDEMIA On Lipitor.   Morbid obesity Weight loss and exercise program recommended.     Signed, Ermalinda Barrios, PA-C  09/26/2014 1:43 PM    [facility] [place], [place], [place]  [address] Phone: [phone]; Fax: [phone]

## 2014-10-01 NOTE — Anesthesia Procedure Notes (Signed)
Procedure Name: Intubation Date/Time: 10/14/2014 4:30 PM Performed by: Trixie Deis A Pre-anesthesia Checklist: Patient identified, Emergency Drugs available, Suction available, Patient being monitored and Timeout performed Patient Re-evaluated:Patient Re-evaluated prior to inductionPreoxygenation: Pre-oxygenation with 100% oxygen Intubation Type: IV induction Ventilation: Mask ventilation without difficulty Laryngoscope Size: Mac and 3 Grade View: Grade II Tube type: Subglottic suction tube Tube size: 7.5 mm Number of attempts: 1 Airway Equipment and Method: Stylet Placement Confirmation: ETT inserted through vocal cords under direct vision,  breath sounds checked- equal and bilateral and CO2 detector Secured at: 22 cm Tube secured with: Tape Dental Injury: Teeth and Oropharynx as per pre-operative assessment  Difficulty Due To: Difficulty was anticipated and Difficult Airway- due to anterior larynx

## 2014-10-01 NOTE — Interval H&P Note (Signed)
History and Physical Interval Note:  10/07/2014 1:18 PM  Gina Solis  has presented today for surgery, with the diagnosis of adnormal stress test  The various methods of treatment have been discussed with the patient and family. After consideration of risks, benefits and other options for treatment, the patient has consented to  Procedure(s): LEFT HEART CATHETERIZATION WITH CORONARY ANGIOGRAM (N/A) as a surgical intervention .  The patient's history has been reviewed, patient examined, no change in status, stable for surgery.  I have reviewed the patient's chart and labs.  Questions were answered to the patient's satisfaction.    Cath Lab Visit (complete for each Cath Lab visit)  Clinical Evaluation Leading to the Procedure:   ACS: No.  Non-ACS:    Anginal Classification: CCS III  Anti-ischemic medical therapy: Minimal Therapy (1 class of medications)  Non-Invasive Test Results: Intermediate-risk stress test findings: cardiac mortality 1-3%/year  Prior CABG: No previous CABG       Gina Solis [facility] 10/20/2014 1:18 PM

## 2014-10-01 NOTE — Progress Notes (Signed)
Daughter, Sallee Lange, stated that patient's purse and cell phone were in the car, and the rest of the belongings were in a patient belongings bag in the custody of family.

## 2014-10-01 NOTE — Progress Notes (Signed)
Patient arrived to floor at 1543 via bed post cath procedure. Patient is alert and oriented by four with complaints of headache at this time rating a 7/10 on 10 point rating scale and nausea. Vitals obtained. Right groin site a level zero with no complications, dressing in place clean/dry/intact. Patient also complains of chest pain that burns rating 3/10 on 10 point scale. Instructed Vivi Evans NT to obtain 12 lead EKG at this time. Patient does not have any medications ordered for pain. Clair Gulling RN paged Dr. Eulas Post. Dr Eulas Post informed of situation and states he is no longer on call and to call the person on call. Nell Range NP paged. Patient vomits and is medicated with Zofran for nausea at 1607 and Nell Range NP informed that patient's heart rhythm looks like ST Elevation. Nell Range NP states she was on her way to the floor. Patient remains alert and responsive. Nell Range arrived to floor at 1610 and states to give patient 2mg  of morphine. Morphine pulled but not given. Code Blue called.

## 2014-10-01 NOTE — Progress Notes (Signed)
   10/23/2014 1700  Clinical Encounter Type  Visited With Family;Health care provider  Visit Type Initial;Code  Stress Factors  Family Stress Factors Health changes;Lack of knowledge   Chaplain responded to a code blue page. Medical team was working on patient when chaplain arrived. Patient's daughter coincidentally arrived in the middle of the situation. Chaplain introduced himself and with the help of the [facility] explained to her what we knew so far.  Patient's daughter has been on the phone calling other family members and seeking support for herself. Patient's daughter paced up and down the hallways as the medical team worked on the patient. Patient's daughter was updated on the condition of the patient again and was made aware how serious the situation was and that the patient might not make it through this. Chaplain took patient's daughter to a separate room so the medical team could keep working. Patient is anticipating another family member arriving soon. Chaplain explained the situation to tonight's on-call chaplain and she has begun providing care for the patient's daughter. Nekisha Mcdiarmid, Claudius Sis, Chaplain  5:10 PM

## 2014-10-01 NOTE — CV Procedure (Signed)
   Cardiac Catheterization Procedure Note  Name: Gina Solis MRN: [id number] DOB: October 12, 1960  Procedure: Left Heart Cath, Selective Coronary Angiography, LV angiography  Indication: 54 yo BF with history of CAD s/p DES of the ostial LAD in March 2015 presents with recurrent angina. Myoview shows evidence of anterior wall ischemia.    Procedural details: The right groin was prepped, draped, and anesthetized with 1% lidocaine. Using modified Seldinger technique, a 5 French sheath was introduced into the right femoral artery. Standard Judkins catheters were used for coronary angiography and left ventriculography. Catheter exchanges were performed over a guidewire. There were no immediate procedural complications. The patient was transferred to the post catheterization recovery area for further monitoring.  Procedural Findings: Hemodynamics:  AO 166/87 mean 119 mm Hg LV 168/16 mm Hg   Coronary angiography: Coronary dominance: right  Left mainstem: Normal  Left anterior descending (LAD): There is a 95% ostial LAD stenosis within the prior stent. The LAD is a large vessel and gives off a moderate to large diagonal branch.  Left circumflex (LCx): Normal  Right coronary artery (RCA): Normal  Left ventriculography: Left ventricular systolic function is normal, LVEF is estimated at 55-65%, there is no significant mitral regurgitation   The left subclavian and left mammary artery are normal.   Final Conclusions:   1. Single vessel obstructive CAD with critical restenosis at the ostium of the LAD with prior DES. 2. Normal LV function.  Recommendations: The patient has restenosis at the origin of the LAD at the site of previously successful stent. This area is at high risk of recurrence with repeat percutaneous intervention. Given the large territory at risk I think we should consider CABG with LIMA to the LAD/diagonal. She has not take Brilinta for 3 days. Will admit and consult CT  surgery.   [name], [place] 10/04/2014, 1:54 PM

## 2014-10-01 NOTE — Progress Notes (Signed)
Site area: RFA Site Prior to Removal:  Level 0 Pressure Applied For:25 min Manual:  yes  Patient Status During Pull:  Vagal reaction Post Pull Site:  Level 0 Post Pull Instructions Given: yes  Post Pull Pulses Present:  Dressing Applied:  clear Bedrest begins @ 1510 Comments:0.6mg  atropine and brief fluid bolus given-- rapid recovery

## 2014-10-02 ENCOUNTER — Encounter (HOSPITAL_COMMUNITY): Payer: Commercial Managed Care - HMO

## 2014-10-02 DIAGNOSIS — I2510 Atherosclerotic heart disease of native coronary artery without angina pectoris: Secondary | ICD-10-CM | POA: Diagnosis not present

## 2014-10-02 MED FILL — Medication: Qty: 1 | Status: AC

## 2014-10-05 ENCOUNTER — Telehealth: Payer: Self-pay | Admitting: *Deleted

## 2014-10-05 NOTE — Telephone Encounter (Signed)
-----   Message from Imogene Burn, PA-C sent at 10/15/2014  7:50 AM EST ----- Labs all normal

## 2014-10-05 NOTE — Telephone Encounter (Signed)
-----   Message from Imogene Burn, PA-C sent at 10/14/2014  7:50 AM EST ----- Labs all normal

## 2014-10-10 ENCOUNTER — Telehealth: Payer: Self-pay | Admitting: Cardiovascular Disease

## 2014-10-10 NOTE — Telephone Encounter (Signed)
Original d/c signed by Dr. Johnsie Cancel mailed back to  [facility] [address] [place] [address] 3.16.16/km

## 2014-10-10 NOTE — Telephone Encounter (Signed)
Original D/C received from [place] gave to [facility] For Dr. Johnsie Cancel to complete.

## 2014-10-26 DEATH — deceased

## 2014-11-08 ENCOUNTER — Other Ambulatory Visit: Payer: Self-pay | Admitting: Adult Health

## 2015-02-24 NOTE — Progress Notes (Signed)
REVIEWED-NO ADDITIONAL RECOMMENDATIONS. 

## 2015-07-11 ENCOUNTER — Ambulatory Visit (HOSPITAL_COMMUNITY): Payer: Medicare HMO | Admitting: Hematology & Oncology

## 2015-11-08 ENCOUNTER — Encounter (HOSPITAL_COMMUNITY): Payer: Self-pay

## 2015-11-18 ENCOUNTER — Telehealth: Payer: Self-pay | Admitting: Cardiovascular Disease

## 2015-11-18 NOTE — Telephone Encounter (Signed)
I don't recall this patient she died 11-Mar-2016I doubt she was pregnant

## 2015-11-18 NOTE — Telephone Encounter (Signed)
New message      Dr Johnsie Cancel signed the death certificate on this patient and marked that she was pregnant at the time of death.  They are calling to confirm that she was pregnant.  Please call

## 2015-11-18 NOTE — Telephone Encounter (Signed)
Called Tamma back with [place] [employer] she is needing a signed letterhead from [place] stating either patient was,wasnot,or can't determine If patient was pregnant. On the d/c pregnant at time of death box was checked and the [facility] for [employer] has pulled this d/c back up and  And questioned this. I have spoke with Pam P and made her aware. Tamma also aware Dr.Nishan will not be back in the office until Tuesday 11/19/15.  [place] [facility] [phone] Cell [phone]

## 2015-11-19 ENCOUNTER — Telehealth: Payer: Self-pay | Admitting: Cardiovascular Disease

## 2015-11-19 NOTE — Telephone Encounter (Signed)
VM left For Gina Solis [place] [employer] to return my call.

## 2015-11-26 ENCOUNTER — Telehealth: Payer: Self-pay | Admitting: Cardiovascular Disease

## 2015-11-26 NOTE — Telephone Encounter (Signed)
Left VM for Tamma with [place] [employer] on her office phone [phone]. This is the second message I have left her with no return call.  The first message was left on her cell.

## 2015-11-26 NOTE — Telephone Encounter (Signed)
Fax verified with Tamma at [place] Letter Dr.Nishan wrote has been faxed to [phone].

## 2016-02-28 IMAGING — US US BREAST LTD UNI RIGHT INC AXILLA
1 series · 4 of 4 positions shown · non-contrast
Comparison: Prior exams

CLINICAL DATA: Six month followup for a small breast lesion on the
right.

EXAM:
DIGITAL DIAGNOSTIC  RIGHT MAMMOGRAM WITH CAD
ULTRASOUND RIGHT BREAST

[Series 1: us breast ltd uni right inc axilla · 0.06mm/px · 4 of 4 slices shown]
[im 1/4]
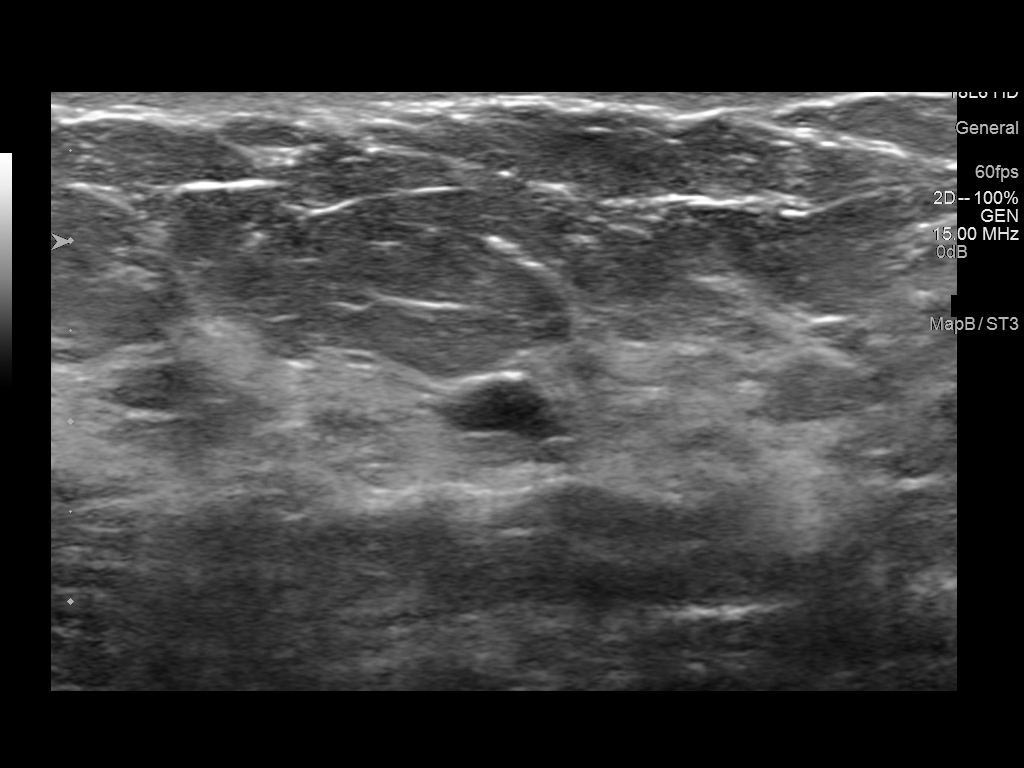
[im 2/4]
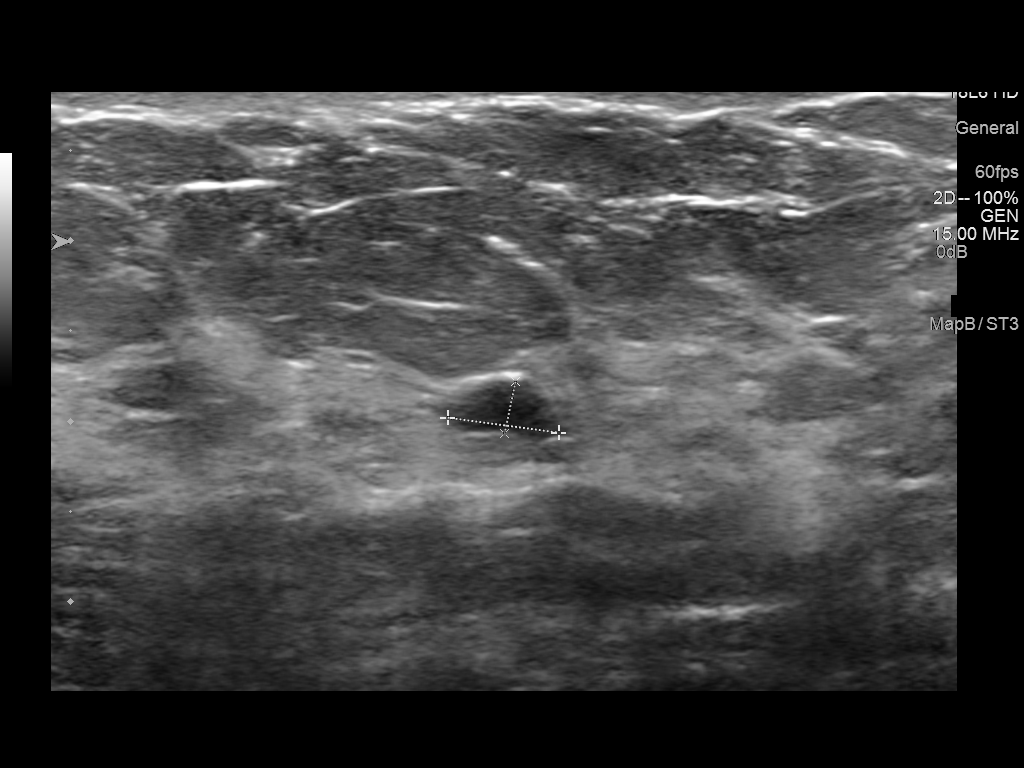
[im 3/4]
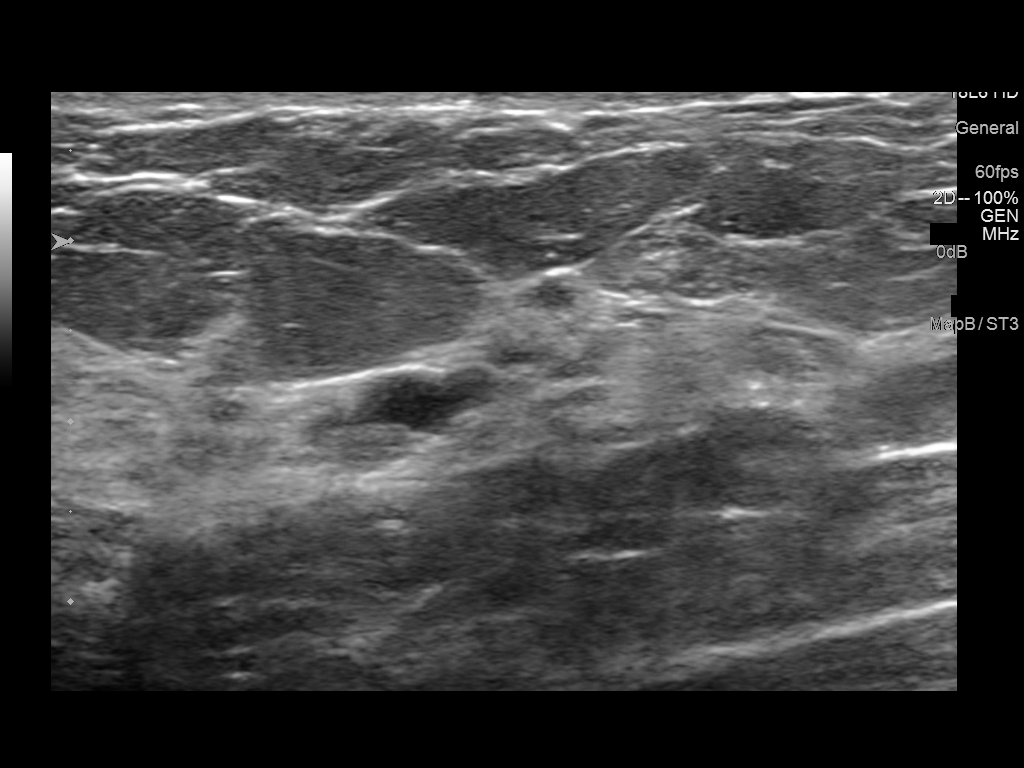
[im 4/4]
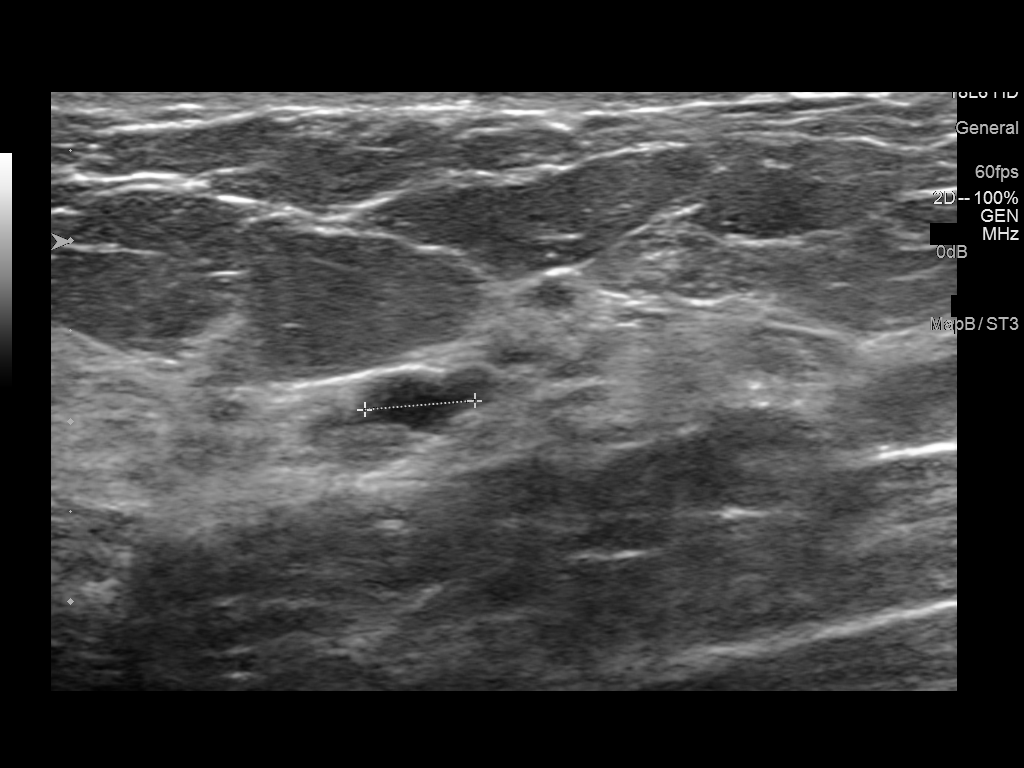

[4 of 4 positions shown; findings below may reference images not displayed]

ACR Breast Density Category c: The breast tissue is heterogeneously
dense, which may obscure small masses.
FINDINGS: There are no discrete masses, areas of architectural distortion or
suspicious calcifications no significant change from prior studies.

Mammographic images were processed with CAD.

On physical exam, no mass is palpable in the right breast.

Ultrasound is performed, showing several small cysts scattered
throughout the left breast, most evident in the retroareolar region,
the largest of these measuring 6.2 mm x 2.9 mm x 6.1 mm. There are
no solid masses or suspicious lesions. The more superficial lesion
seen on the prior study at 5 o'clock, 6 cm from the nipple, could
not reproduced on the current exam. This was likely also cyst.
IMPRESSION: Benign right breast cysts.  No evidence of malignancy.

RECOMMENDATION:
Screening mammogram in J lie [id number] to return to normal annual
screening mammography.(Code:[id number])

I have discussed the findings and recommendations with the patient.
Results were also provided in writing at the conclusion of the
visit. If applicable, a reminder letter will be sent to the patient
regarding the next appointment.

BI-RADS CATEGORY  2: Benign.
